# Patient Record
Sex: Female | Born: 1985 | Race: White | Hispanic: No | Marital: Single | Smoking: Never smoker
Health system: Southern US, Community
[De-identification: ages and names within clinical notes are randomized; demographics above are authoritative.]

## PROBLEM LIST (undated history)

## (undated) DIAGNOSIS — N920 Excessive and frequent menstruation with regular cycle: Secondary | ICD-10-CM

## (undated) DIAGNOSIS — R569 Unspecified convulsions: Secondary | ICD-10-CM

## (undated) DIAGNOSIS — Z923 Personal history of irradiation: Secondary | ICD-10-CM

## (undated) DIAGNOSIS — R51 Headache: Secondary | ICD-10-CM

## (undated) DIAGNOSIS — R519 Headache, unspecified: Secondary | ICD-10-CM

## (undated) DIAGNOSIS — C801 Malignant (primary) neoplasm, unspecified: Secondary | ICD-10-CM

## (undated) DIAGNOSIS — C50919 Malignant neoplasm of unspecified site of unspecified female breast: Secondary | ICD-10-CM

## (undated) DIAGNOSIS — Z9221 Personal history of antineoplastic chemotherapy: Secondary | ICD-10-CM

## (undated) DIAGNOSIS — Z973 Presence of spectacles and contact lenses: Secondary | ICD-10-CM

## (undated) HISTORY — PX: ANKLE ARTHROSCOPY: SUR85

## (undated) HISTORY — DX: Headache: R51

## (undated) HISTORY — DX: Unspecified convulsions: R56.9

## (undated) HISTORY — DX: Headache, unspecified: R51.9

---

## 2005-07-09 ENCOUNTER — Ambulatory Visit (HOSPITAL_BASED_OUTPATIENT_CLINIC_OR_DEPARTMENT_OTHER): Admission: RE | Admit: 2005-07-09 | Discharge: 2005-07-09 | Payer: Self-pay | Admitting: Orthopedic Surgery

## 2005-07-09 ENCOUNTER — Ambulatory Visit (HOSPITAL_COMMUNITY): Admission: RE | Admit: 2005-07-09 | Discharge: 2005-07-09 | Payer: Self-pay | Admitting: Orthopedic Surgery

## 2007-07-20 ENCOUNTER — Ambulatory Visit: Payer: Self-pay | Admitting: Internal Medicine

## 2007-07-20 LAB — CONVERTED CEMR LAB
ALT: 13 units/L (ref 0–35)
AST: 16 units/L (ref 0–37)
Albumin: 4 g/dL (ref 3.5–5.2)
Bilirubin Urine: NEGATIVE
GFR calc Af Amer: 159 mL/min
Leukocytes, UA: NEGATIVE
Nitrite: NEGATIVE
Potassium: 4.2 meq/L (ref 3.5–5.1)
Sodium: 148 meq/L — ABNORMAL HIGH (ref 135–145)
Specific Gravity, Urine: 1.025 (ref 1.000–1.03)
Total Bilirubin: 0.5 mg/dL (ref 0.3–1.2)
Total Protein, Urine: NEGATIVE mg/dL

## 2011-04-20 NOTE — Assessment & Plan Note (Signed)
[facility]                           PRIMARY CARE OFFICE NOTE   Amy Pittman, Amy Pittman                   MRN:          [id number]  DATE:07/20/2007                            DOB:          08-29-1986    Amy Pittman is a 25 year old woman who presents for a [employer]  physical exam. She has no complaints and actually reports that she is  feeling well and doing well.   PAST MEDICAL HISTORY:   SURGICAL:  None. Orthopedic: Chipped bone in the ankle in 2006.   MEDICAL:  The patient had chicken pox and the usual childhood diseases  with no other medical abnormalities or history.   MEDICATIONS:  None.   ALLERGIES:  None.   HABITS:  No tobacco and no alcohol.   FAMILY HISTORY:  Positive for an alcohol problem in a distant relative.  The patient had a relative with ovarian cancer. A grandparent with lung  cancer. A second degree kinship with hypertension, cardiac disease.  Grandparents with diabetes. A grandparent with glaucoma.   SOCIAL HISTORY:  The patient has graduated from [employer] with a  Bachelor's Degree. 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 She is not sexually active  and is not involved in a significant relationship at this time. She does  hope to go and join the [employer].   The patient reports that she did have some abdominal discomfort and this  was thought to be possibly GYN in origin. She was seen at an urgent care  clinic in [place] on 06/24/2007 and reports that she had a normal breast  exam. She also had a pelvic and pap smear at the time which she was told  was normal. It was suggested to her that she pursue further evaluation  with a transvaginal ultrasound.   The patient reports that she had a TB skin test in October 2007 at  [facility] and there was no induration and I have asked for  records.   IMMUNIZATIONS:  The patient had a DTP series 08/28/1984,  10/11/1984,  12/20/1984, and 02/06/1986. OPD series 08/28/1984, 10/11/1984, 02/06/1986. MMR  09/19/1985 with a booster on 04/24/1993. The patient has never had  hepatitis B immunization.   PHYSICAL EXAMINATION:  VITAL SIGNS:  Temperature 97.1, blood pressure  101/64, pulse 96, weight 165, height 5 foot 5 inches.  GENERAL:  This is a well developed, well nourished mildly overweight  woman in no acute distress.  HEENT:  Normocephalic atraumatic. EACs and tympanic membranes were  normal. Oropharynx with native dentition in good repair. No buccal or  palatal lesions were noted. Posterior pharynx clear. Conjunctivae and  sclerae were clear. PERRLA. EOMI.  Funduscopic exam was unremarkable.  NECK:  Supple without thyromegaly. No adenopathy was noted in the  cervical supraclavicular regions.  CHEST:  No CVA tenderness.  LUNGS:  Clear with no wheezes, rales, or rhonchi.  BREAST:  Deferred to a recent exam at urgent care.  CARDIOVASCULAR:  2+ pulse radial pulse, no JVD or carotid bruits. She  had a quiet precordium with a regular  rate and rhythm with no murmurs,  rubs, or gallops.  ABDOMEN:  Soft, no guarding or rebound. No organo-splenomegaly was  noted.  PELVIC:  Deferred to recent exam as noted.  RECTAL:  Deferred to recent exam as noted.  EXTREMITIES:  Without cyanosis, clubbing, or edema or deformity.  NEUROLOGIC:  Non-focal.   LABORATORY DATA:  Sodium 148, potassium 4.2, chloride 111, CO2 32,  glucose 110, BUN 8, creatinine 0.6, liver functions were normal.  Urinalysis was negative.   ASSESSMENT AND PLAN:  This is a healthy woman. 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 She is still needing to have hepatitis B  service antigen B, core antibody, and C antibody, G6PD  titer,  which  are pending as is HIV serology. 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   In summary, this is a very pleasant woman who seems to be medically  stable.     Rosalyn Gess Norins, MD  Electronically Signed    MEN/MedQ  DD: 07/22/2007  DT: 07/23/2007  Job #: [id number]   cc:   Donita Brooks

## 2011-04-23 NOTE — Op Note (Signed)
NAMEDRAYA, FELKER          ACCOUNT NO.:  [id number]   MEDICAL RECORD NO.:  [id number]          PATIENT TYPE:  OUT   LOCATION:  [facility]                         FACILITY:  [facility]   PHYSICIAN:  Feliberto Gottron. Turner Daniels, M.D.   DATE OF BIRTH:  05-May-1986   DATE OF PROCEDURE:  07/09/2005  DATE OF DISCHARGE:  07/09/2005                                 OPERATIVE REPORT   PREOPERATIVE DIAGNOSIS:  Right ankle osteochondral loose body.   POSTOPERATIVE DIAGNOSIS:  Same.   PROCEDURE:  Right ankle arthroscopic debridement, removal loose body.   SURGEON:  Feliberto Gottron. Turner Daniels, M.D.   ASSISTANT:  Skip Mayer, PA-C.   ANESTHETIC:  General LMA.   ESTIMATED BLOOD LOSS:  Minimal.   FLUID REPLACEMENT:  600 mL crystalloid.   TOURNIQUET TIME:  None.   INDICATIONS FOR PROCEDURE:  25 year old woman with MRI proven superolateral  osteochondral loose body of the right ankle symptomatic. She has failed  conservative treatment with observation, air cast bracing, anti-inflammatory  medicines and by MRI scan, this is a flap tear that was probably loose and  she desires elective arthroscopic evaluation and treatment of same.   DESCRIPTION OF PROCEDURE:  The patient identified by armband, taken the  operating room at [facility] day surgery center. Appropriate anesthetic monitors  were attached and general LMA anesthesia induced with the patient in the  supine position. Ankle distractor applied to the table and the right lower  extremity prepped and draped in usual sterile fashion from the toes to  tourniquet placed the mid calf on the right side that was not used.  Ankle  distraction was then applied and the joint was filled with half percent  Marcaine and epinephrine solution using the anterolateral approach with an  18 gauge needle. Once the joint was distended, a small incision was made  with a #11 blade along the anterolateral portal and the ankle arthroscope  introduced with a pressure set between 50 and 70 mmHg.  Diagnostic  arthroscopy revealed the medial gutter was clear as were the articular  surface of the distal tibia and talar dome except for superior laterally  where the osteochondral flap tear of the cartilage was noted.  At this point  we swapped portals bringing the scope in from the medial side after making  the anteromedial portal and we then removed the flap tear and osteochondral  loose body piecemeal with a 2.9 barracuda sucker shaver, some arthroscopic  baskets and a probe. Once the flap tears and loose bodies had been removed,  photographic documentation was made of the defect on the talar dome and the  ankle was irrigated out with normal saline solution.  At this point the arthroscopic instruments were removed and a dressing of  Xeroform, 4x4 dressing sponges, Webril and Ace wrap applied. The patient was  awakened and taken to the recovery room without difficulty. We did female  photographic documentation of the lesion before and after debridement.      Feliberto Gottron. Turner Daniels, M.D.  Electronically Signed     FJR/MEDQ  D:  07/26/2005  T:  07/26/2005  Job:  [id number]

## 2012-05-09 ENCOUNTER — Other Ambulatory Visit (INDEPENDENT_AMBULATORY_CARE_PROVIDER_SITE_OTHER): Payer: BC Managed Care – PPO

## 2012-05-09 ENCOUNTER — Encounter: Payer: Self-pay | Admitting: Internal Medicine

## 2012-05-09 ENCOUNTER — Ambulatory Visit (INDEPENDENT_AMBULATORY_CARE_PROVIDER_SITE_OTHER): Payer: BC Managed Care – PPO | Admitting: Internal Medicine

## 2012-05-09 VITALS — BP 104/78 | HR 82 | Temp 98.2°F | Resp 16 | Wt 163.0 lb

## 2012-05-09 DIAGNOSIS — R569 Unspecified convulsions: Secondary | ICD-10-CM

## 2012-05-09 DIAGNOSIS — Z0000 Encounter for general adult medical examination without abnormal findings: Secondary | ICD-10-CM

## 2012-05-09 DIAGNOSIS — R109 Unspecified abdominal pain: Secondary | ICD-10-CM

## 2012-05-09 DIAGNOSIS — N926 Irregular menstruation, unspecified: Secondary | ICD-10-CM

## 2012-05-09 LAB — CBC WITH DIFFERENTIAL/PLATELET
Basophils Relative: 0.1 % (ref 0.0–3.0)
Eosinophils Absolute: 0 10*3/uL (ref 0.0–0.7)
HCT: 37.2 % (ref 36.0–46.0)
Hemoglobin: 12.3 g/dL (ref 12.0–15.0)
Lymphocytes Relative: 19.7 % (ref 12.0–46.0)
MCHC: 33.1 g/dL (ref 30.0–36.0)
MCV: 86.5 fl (ref 78.0–100.0)
Monocytes Absolute: 0.6 10*3/uL (ref 0.1–1.0)
Neutro Abs: 7.2 10*3/uL (ref 1.4–7.7)
RBC: 4.3 Mil/uL (ref 3.87–5.11)

## 2012-05-09 LAB — COMPREHENSIVE METABOLIC PANEL
ALT: 11 U/L (ref 0–35)
AST: 15 U/L (ref 0–37)
BUN: 10 mg/dL (ref 6–23)
Calcium: 9.1 mg/dL (ref 8.4–10.5)
Creatinine, Ser: 0.8 mg/dL (ref 0.4–1.2)
GFR: 96.38 mL/min (ref >60.00)
Glucose, Bld: 80 mg/dL (ref 70–99)
Potassium: 5.3 mEq/L — ABNORMAL HIGH (ref 3.5–5.1)
Sodium: 143 mEq/L (ref 135–145)
Total Bilirubin: 0.3 mg/dL (ref 0.3–1.2)

## 2012-05-09 LAB — LIPID PANEL: HDL: 62 mg/dL (ref >39.00)

## 2012-05-10 ENCOUNTER — Encounter: Payer: Self-pay | Admitting: Internal Medicine

## 2012-05-10 DIAGNOSIS — Z0000 Encounter for general adult medical examination without abnormal findings: Secondary | ICD-10-CM | POA: Insufficient documentation

## 2012-05-10 DIAGNOSIS — R109 Unspecified abdominal pain: Secondary | ICD-10-CM | POA: Insufficient documentation

## 2012-05-10 DIAGNOSIS — N926 Irregular menstruation, unspecified: Secondary | ICD-10-CM | POA: Insufficient documentation

## 2012-05-10 DIAGNOSIS — R569 Unspecified convulsions: Secondary | ICD-10-CM | POA: Insufficient documentation
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
## 2012-05-10 NOTE — Assessment & Plan Note (Signed)
Mild discomfort. No evidence of gastric irritation or bleeding. Symptoms are not c/w IBS  Plan - trial of otc anti-gassing agent or liquid antacid with anti-gassing agent, i.e. Mylanta II

## 2012-05-10 NOTE — Assessment & Plan Note (Signed)
Long standing problem. In association with being sexually active and needing adequate birth control she is advised to establish with a gynecologist for routine care, cycling of her period and birth control using oral contraceptives.

## 2012-05-10 NOTE — Assessment & Plan Note (Addendum)
Amy Pittman is a very nice woman who is medically stable. Limited physical exam is normal. She is advised to see a gynecologist. Routine labs are ordered to establish baseline chemistries and lipid panel. Will also check CBC. She is encouraged to develop a regular exercise program.   In summary - a new patient now established. She is asked to return in 6-8 weeks to allow time to obtain and review previous medical records, especially neurology notes.   Addendum: labs reviewed. Her LDL cholesterol is borderline with goal of 130 or less. Recommend a prudent diet and regular exercise.
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
## 2012-05-14 ENCOUNTER — Encounter: Payer: Self-pay | Admitting: Internal Medicine

## 2012-06-20 ENCOUNTER — Telehealth: Payer: Self-pay | Admitting: Internal Medicine

## 2012-06-20 ENCOUNTER — Encounter: Payer: Self-pay | Admitting: Internal Medicine

## 2012-06-20 ENCOUNTER — Ambulatory Visit (INDEPENDENT_AMBULATORY_CARE_PROVIDER_SITE_OTHER)
Admission: RE | Admit: 2012-06-20 | Discharge: 2012-06-20 | Disposition: A | Discharge status: Home / Self Care | Payer: BC Managed Care – PPO | Source: Ambulatory Visit | Attending: Internal Medicine | Admitting: Internal Medicine

## 2012-06-20 ENCOUNTER — Ambulatory Visit (INDEPENDENT_AMBULATORY_CARE_PROVIDER_SITE_OTHER): Payer: BC Managed Care – PPO | Admitting: Internal Medicine

## 2012-06-20 VITALS — BP 98/60 | HR 80 | Temp 97.5°F | Resp 16 | Wt 165.0 lb

## 2012-06-20 DIAGNOSIS — M533 Sacrococcygeal disorders, not elsewhere classified: Secondary | ICD-10-CM

## 2012-06-20 NOTE — Telephone Encounter (Signed)
Forward 15 pages to Dr. Illene Regulus for review on 06-20-12 ym

## 2012-06-20 NOTE — Progress Notes (Signed)
  Subjective:    Patient ID: Amy Pittman, female    DOB: [birth year], 26 y.o.   MRN: [id number]  HPI Ms. Tuggle returns for follow up. She has had a chance to review the original note: no additions or corrections. In the interval she has started doing "Insanity" workouts.  For some time she has had pain at the lower sacrum when sitting. After sitting for some time she will have pain when she rises. There is no history or trauma, no report of any lesion or sinus track, no rectal pain or change in bowel habit.  PMH, FamHx and SocHx reviewed for any changes and relevance.    Review of Systems System review is negative for any constitutional, cardiac, pulmonary, GI or neuro symptoms or complaints other than as described in the HPI.     Objective:   Physical Exam Filed Vitals:   06/20/12 1317  BP: 98/60  Pulse: 80  Temp: 97.5 F (36.4 C)  Resp: 16   Gen'l- WNWD white woman in no distress MSK - no lesions on exam of sacrum. Tender to palpation over lower sacrum. Normal stand, flex, toe/heel walk, normal gait        Assessment & Plan:  Pain at sacrum - normal exam. Question of occult fracture vs bone lesion  Plan Plain x-rays sacrum/coccyx  If films are normal and pain persists next step is bone scan.   Addendum: x-ray results- SACRUM AND COCCYX - 2+ VIEW  Comparison: None.  Findings: No fracture is seen.  The visualized bony pelvis appears intact.  IMPRESSION:  No fracture is seen.

## 2012-06-26 ENCOUNTER — Telehealth: Payer: Self-pay | Admitting: Internal Medicine

## 2012-06-26 NOTE — Telephone Encounter (Signed)
Received 25 pages from [place]. Sent to Dr. Debby Bud. 06/26/12/sd

## 2012-08-18 ENCOUNTER — Ambulatory Visit (INDEPENDENT_AMBULATORY_CARE_PROVIDER_SITE_OTHER)
Admission: RE | Admit: 2012-08-18 | Discharge: 2012-08-18 | Disposition: A | Discharge status: Home / Self Care | Payer: BC Managed Care – PPO | Source: Ambulatory Visit | Attending: Internal Medicine | Admitting: Internal Medicine

## 2012-08-18 ENCOUNTER — Encounter: Payer: Self-pay | Admitting: Internal Medicine

## 2012-08-18 ENCOUNTER — Ambulatory Visit (INDEPENDENT_AMBULATORY_CARE_PROVIDER_SITE_OTHER): Payer: BC Managed Care – PPO | Admitting: Internal Medicine

## 2012-08-18 VITALS — BP 94/70 | HR 72 | Temp 97.2°F | Resp 16 | Ht 65.0 in | Wt 162.0 lb

## 2012-08-18 DIAGNOSIS — M25572 Pain in left ankle and joints of left foot: Secondary | ICD-10-CM

## 2012-08-18 DIAGNOSIS — M659 Synovitis and tenosynovitis, unspecified: Secondary | ICD-10-CM

## 2012-08-18 DIAGNOSIS — M25579 Pain in unspecified ankle and joints of unspecified foot: Secondary | ICD-10-CM

## 2012-08-18 DIAGNOSIS — M7752 Other enthesopathy of left foot: Secondary | ICD-10-CM

## 2012-08-18 MED ORDER — DICLOFENAC SODIUM 75 MG PO TBEC: 75.0000 mg | DELAYED_RELEASE_TABLET | Freq: Two times a day (BID) | ORAL | Status: AC

## 2012-08-18 NOTE — Progress Notes (Signed)
Left detailed mess informing pt of below.  

## 2012-08-18 NOTE — Progress Notes (Signed)
  Subjective:    Patient ID: Amy Pittman, female    DOB: 10/31/1986, 26 y.o.   MRN: [id number]  Ankle Pain  The incident occurred more than 1 week ago. The incident occurred at home. There was no injury mechanism. The pain is present in the left ankle. The quality of the pain is described as aching and shooting. The pain is moderate. The pain has been constant since onset. Pertinent negatives include no inability to bear weight, loss of motion, numbness or tingling. She reports no foreign bodies present. The symptoms are aggravated by weight bearing and palpation (Descending stairs, prolonged standing). She has tried acetaminophen and rest for the symptoms. The treatment provided no relief.    Past Medical History  Diagnosis Date  . History of chicken pox   . Fainting spell   . Generalized headaches   . 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 sees a nuerologist, does not no name., is on no medication at this time due to no insurance  . Irregular menses      Review of Systems  Constitutional: Negative for fever and fatigue.  Musculoskeletal: Positive for gait problem. Negative for joint swelling.  Neurological: Negative for tingling, weakness and numbness.       Objective:   Physical Exam BP 94/70  Pulse 72  Temp 97.2 F (36.2 C) (Oral)  Resp 16  Ht 5\' 5"  (1.651 m)  Wt 162 lb (73.483 kg)  BMI 26.96 kg/m2 Wt Readings from Last 3 Encounters:  08/18/12 162 lb (73.483 kg)  06/20/12 165 lb (74.844 kg)  05/09/12 163 lb (73.936 kg)   Constitutional: She appears well-developed and well-nourished. No distress.  Musculoskeletal: Left ankle without gross deformity. Painful to palpation over medial malleolus. Full range of motion with pain on passive dorsiflexion -ligamentous function intact, no effusion. Neurovascular intact. Skin: Skin is warm and dry. No rash or bruise noted. No erythema.  Psychiatric: She has a normal mood and affect. Her  behavior is normal. Judgment and thought content normal.        Assessment & Plan:  Left ankle pain over medial malleolus. No injury mechanism. Suspect tendinitis  Check plain x-ray Continue over-the-counter brace for support Rest, ice, compress and elevate Prescription Voltaren twice a day x1 week, then as needed Patient education material provided, reassurance provided  patient to call if worse or unimproved

## 2012-08-18 NOTE — Patient Instructions (Signed)
It was good to see you today. Test(s) ordered today. Your results will be called to you after review (48-72hours after test completion). If any changes need to be made, you will be notified at that time. Use Voltaren twice a day with food for one week, then as needed for ankle pain Your prescription(s) have been submitted to your pharmacy. Please take as directed and contact our office if you believe you are having problem(s) with the medication(s). Place cold ice pak to painful area of ankle 2 times a day for 10-15 minutes x5-7 days Sprains may take several weeks to recover, continue to use your ankle support as needed and avoid high impact activity until pain resolved Call if unimproved in next 4-6 weeks, sooner if pain becomes worse Ankle Pain Ankle pain is a common symptom. The bones, cartilage, tendons, and muscles of the ankle joint perform a lot of work each day. The ankle joint holds your body weight and allows you to move around. Ankle pain can occur on either side or back of 1 or both ankles. Ankle pain may be sharp and burning or dull and aching. There may be tenderness, stiffness, redness, or warmth around the ankle. The pain occurs more often when a person walks or puts pressure on the ankle. CAUSES   There are many reasons ankle pain can develop. It is important to work with your caregiver to identify the cause since many conditions can impact the bones, cartilage, muscles, and tendons. Causes for ankle pain include:  Injury, including a break (fracture), sprain, or strain often due to a fall, sports, or a high-impact activity.   Swelling (inflammation) of a tendon (tendonitis).   Achilles tendon rupture.   Ankle instability after repeated sprains and strains.   Poor foot alignment.   Pressure on a nerve (tarsal tunnel syndrome).   Arthritis in the ankle or the lining of the ankle.   Crystal formation in the ankle (gout or pseudogout).  DIAGNOSIS   A diagnosis is based on  your medical history, your symptoms, results of your physical exam, and results of diagnostic tests. Diagnostic tests may include X-ray exams or a computerized magnetic scan (magnetic resonance imaging, MRI). TREATMENT   Treatment will depend on the cause of your ankle pain and may include:  Keeping pressure off the ankle and limiting activities.   Using crutches or other walking support (a cane or brace).   Using rest, ice, compression, and elevation.   Participating in physical therapy or home exercises.   Wearing shoe inserts or special shoes.   Losing weight.   Taking medications to reduce pain or swelling or receiving an injection.   Undergoing surgery.  HOME CARE INSTRUCTIONS    Only take over-the-counter or prescription medicines for pain, discomfort, or fever as directed by your caregiver.   Put ice on the injured area.   Put ice in a plastic bag.   Place a towel between your skin and the bag.   Leave the ice on for 15 to 20 minutes at a time, 3 to 4 times a day.   Keep your leg raised (elevated) when possible to lessen swelling.   Avoid activities that cause ankle pain.   Follow specific exercises as directed by your caregiver.   Record how often you have ankle pain, the location of the pain, and what it feels like. This information may be helpful to you and your caregiver.   Ask your caregiver about returning to work or sports  and whether you should drive.   Follow up with your caregiver for further examination, therapy, or testing as directed.  SEEK MEDICAL CARE IF:    Pain or swelling continues or worsens beyond 1 week.   You have an oral temperature above 102 F (38.9 C).   You are feeling unwell or have chills.   You are having an increasingly difficult time with walking.   You have loss of sensation or other new symptoms.   You have questions or concerns.  MAKE SURE YOU:    Understand these instructions.   Will watch your condition.   Will  get help right away if you are not doing well or get worse.  Document Released: 05/12/2010 Document Revised: 11/11/2011 Document Reviewed: 05/12/2010 [facility] Patient Information 2012 [place], [place].

## 2015-01-21 ENCOUNTER — Ambulatory Visit (INDEPENDENT_AMBULATORY_CARE_PROVIDER_SITE_OTHER): Payer: BLUE CROSS/BLUE SHIELD | Admitting: Internal Medicine

## 2015-01-21 ENCOUNTER — Encounter: Payer: Self-pay | Admitting: Internal Medicine

## 2015-01-21 ENCOUNTER — Ambulatory Visit: Payer: BC Managed Care – PPO | Admitting: Internal Medicine

## 2015-01-21 ENCOUNTER — Other Ambulatory Visit (INDEPENDENT_AMBULATORY_CARE_PROVIDER_SITE_OTHER): Payer: BLUE CROSS/BLUE SHIELD

## 2015-01-21 VITALS — BP 112/70 | HR 88 | Temp 98.3°F | Resp 16 | Ht 66.0 in | Wt 188.4 lb

## 2015-01-21 DIAGNOSIS — Z0000 Encounter for general adult medical examination without abnormal findings: Secondary | ICD-10-CM

## 2015-01-21 DIAGNOSIS — G40909 Epilepsy, unspecified, not intractable, without status epilepticus: Secondary | ICD-10-CM

## 2015-01-21 DIAGNOSIS — R569 Unspecified convulsions: Secondary | ICD-10-CM

## 2015-01-21 DIAGNOSIS — R7989 Other specified abnormal findings of blood chemistry: Secondary | ICD-10-CM

## 2015-01-21 LAB — COMPREHENSIVE METABOLIC PANEL
ALBUMIN: 3.9 g/dL (ref 3.5–5.2)
ALT: 14 U/L (ref 0–35)
AST: 16 U/L (ref 0–37)
Alkaline Phosphatase: 44 U/L (ref 39–117)
BUN: 12 mg/dL (ref 6–23)
CALCIUM: 9.6 mg/dL (ref 8.4–10.5)
CHLORIDE: 104 meq/L (ref 96–112)
CO2: 26 meq/L (ref 19–32)
CREATININE: 0.81 mg/dL (ref 0.40–1.20)
GFR: 87.88 mL/min (ref >60.00)
GLUCOSE: 103 mg/dL — AB (ref 70–99)
Potassium: 4.6 mEq/L (ref 3.5–5.1)
SODIUM: 137 meq/L (ref 135–145)
Total Bilirubin: 0.3 mg/dL (ref 0.2–1.2)
Total Protein: 7.5 g/dL (ref 6.0–8.3)

## 2015-01-21 LAB — LIPID PANEL
Cholesterol: 260 mg/dL — ABNORMAL HIGH (ref 0–200)
HDL: 68.2 mg/dL (ref >39.00)
NonHDL: 191.8
TRIGLYCERIDES: 256 mg/dL — AB (ref 0.0–149.0)
Total CHOL/HDL Ratio: 4
VLDL: 51.2 mg/dL — AB (ref 0.0–40.0)

## 2015-01-21 LAB — LDL CHOLESTEROL, DIRECT: Direct LDL: 148 mg/dL

## 2015-01-21 NOTE — Patient Instructions (Signed)
We will send you to the neurologist to get their opinion if you should be driving or not.   We will check your blood work today. We will call with the results. Come back in about a year for a physical.   Back Exercises These exercises may help you when beginning to rehabilitate your injury. Your symptoms may resolve with or without further involvement from your physician, physical therapist or athletic trainer. While completing these exercises, remember:   Restoring tissue flexibility helps normal motion to return to the joints. This allows healthier, less painful movement and activity.  An effective stretch should be held for at least 30 seconds.  A stretch should never be painful. You should only feel a gentle lengthening or release in the stretched tissue. STRETCH - Extension, Prone on Elbows   Lie on your stomach on the floor, a bed will be too soft. Place your palms about shoulder width apart and at the height of your head.  Place your elbows under your shoulders. If this is too painful, stack pillows under your chest.  Allow your body to relax so that your hips drop lower and make contact more completely with the floor.  Hold this position for __________ seconds.  Slowly return to lying flat on the floor. Repeat __________ times. Complete this exercise __________ times per day.  RANGE OF MOTION - Extension, Prone Press Ups   Lie on your stomach on the floor, a bed will be too soft. Place your palms about shoulder width apart and at the height of your head.  Keeping your back as relaxed as possible, slowly straighten your elbows while keeping your hips on the floor. You may adjust the placement of your hands to maximize your comfort. As you gain motion, your hands will come more underneath your shoulders.  Hold this position __________ seconds.  Slowly return to lying flat on the floor. Repeat __________ times. Complete this exercise __________ times per day.  RANGE OF MOTION-  Quadruped, Neutral Spine   Assume a hands and knees position on a firm surface. Keep your hands under your shoulders and your knees under your hips. You may place padding under your knees for comfort.  Drop your head and point your tail bone toward the ground below you. This will round out your low back like an angry cat. Hold this position for __________ seconds.  Slowly lift your head and release your tail bone so that your back sags into a large arch, like an old horse.  Hold this position for __________ seconds.  Repeat this until you feel limber in your low back.  Now, find your "sweet spot." This will be the most comfortable position somewhere between the two previous positions. This is your neutral spine. Once you have found this position, tense your stomach muscles to support your low back.  Hold this position for __________ seconds. Repeat __________ times. Complete this exercise __________ times per day.  STRETCH - Flexion, Single Knee to Chest   Lie on a firm bed or floor with both legs extended in front of you.  Keeping one leg in contact with the floor, bring your opposite knee to your chest. Hold your leg in place by either grabbing behind your thigh or at your knee.  Pull until you feel a gentle stretch in your low back. Hold __________ seconds.  Slowly release your grasp and repeat the exercise with the opposite side. Repeat __________ times. Complete this exercise __________ times per day.  STRETCH - Hamstrings, Standing  Stand or sit and extend your right / left leg, placing your foot on a chair or foot stool  Keeping a slight arch in your low back and your hips straight forward.  Lead with your chest and lean forward at the waist until you feel a gentle stretch in the back of your right / left knee or thigh. (When done correctly, this exercise requires leaning only a small distance.)  Hold this position for __________ seconds. Repeat __________ times. Complete  this stretch __________ times per day. STRENGTHENING - Deep Abdominals, Pelvic Tilt   Lie on a firm bed or floor. Keeping your legs in front of you, bend your knees so they are both pointed toward the ceiling and your feet are flat on the floor.  Tense your lower abdominal muscles to press your low back into the floor. This motion will rotate your pelvis so that your tail bone is scooping upwards rather than pointing at your feet or into the floor.  With a gentle tension and even breathing, hold this position for __________ seconds. Repeat __________ times. Complete this exercise __________ times per day.  STRENGTHENING - Abdominals, Crunches   Lie on a firm bed or floor. Keeping your legs in front of you, bend your knees so they are both pointed toward the ceiling and your feet are flat on the floor. Cross your arms over your chest.  Slightly tip your chin down without bending your neck.  Tense your abdominals and slowly lift your trunk high enough to just clear your shoulder blades. Lifting higher can put excessive stress on the low back and does not further strengthen your abdominal muscles.  Control your return to the starting position. Repeat __________ times. Complete this exercise __________ times per day.  STRENGTHENING - Quadruped, Opposite UE/LE Lift   Assume a hands and knees position on a firm surface. Keep your hands under your shoulders and your knees under your hips. You may place padding under your knees for comfort.  Find your neutral spine and gently tense your abdominal muscles so that you can maintain this position. Your shoulders and hips should form a rectangle that is parallel with the floor and is not twisted.  Keeping your trunk steady, lift your right hand no higher than your shoulder and then your left leg no higher than your hip. Make sure you are not holding your breath. Hold this position __________ seconds.  Continuing to keep your abdominal muscles tense and  your back steady, slowly return to your starting position. Repeat with the opposite arm and leg. Repeat __________ times. Complete this exercise __________ times per day. Document Released: 12/10/2005 Document Revised: 02/14/2012 Document Reviewed: 03/06/2009 [facility] Patient Information 2015 [place], [place]. This information is not intended to replace advice given to you by your health care provider. Make sure you discuss any questions you have with your health care provider.  Back Injury Prevention Back injuries can be extremely painful and difficult to heal. After having one back injury, you are much more likely to experience another later on. It is important to learn how to avoid injuring or re-injuring your back. The following tips can help you to prevent a back injury. PHYSICAL FITNESS  Exercise regularly and try to develop good tone in your abdominal muscles. Your abdominal muscles provide a lot of the support needed by your back.  Do aerobic exercises (walking, jogging, biking, swimming) regularly.  Do exercises that increase balance and strength (tai chi,  yoga) regularly. This can decrease your risk of falling and injuring your back.  Stretch before and after exercising.  Maintain a healthy weight. The more you weigh, the more stress is placed on your back. For every pound of weight, 10 times that amount of pressure is placed on the back. DIET  Talk to your caregiver about how much calcium and vitamin D you need per day. These nutrients help to prevent weakening of the bones (osteoporosis). Osteoporosis can cause broken (fractured) bones that lead to back pain.  Include good sources of calcium in your diet, such as dairy products, green, leafy vegetables, and products with calcium added (fortified).  Include good sources of vitamin D in your diet, such as milk and foods that are fortified with vitamin D.  Consider taking a nutritional supplement or a multivitamin if needed.  Stop  smoking if you smoke. POSTURE  Sit and stand up straight. Avoid leaning forward when you sit or hunching over when you stand.  Choose chairs with good low back (lumbar) support.  If you work at a desk, sit close to your work so you do not need to lean over. Keep your chin tucked in. Keep your neck drawn back and elbows bent at a right angle. Your arms should look like the letter "L."  Sit high and close to the steering wheel when you drive. Add a lumbar support to your car seat if needed.  Avoid sitting or standing in one position for too long. Take breaks to get up, stretch, and walk around at least once every hour. Take breaks if you are driving for long periods of time.  Sleep on your side with your knees slightly bent, or sleep on your back with a pillow under your knees. Do not sleep on your stomach. LIFTING, TWISTING, AND REACHING  Avoid heavy lifting, especially repetitive lifting. If you must do heavy lifting:  Stretch before lifting.  Work slowly.  Rest between lifts.  Use carts and dollies to move objects when possible.  Make several small trips instead of carrying 1 heavy load.  Ask for help when you need it.  Ask for help when moving big, awkward objects.  Follow these steps when lifting:  Stand with your feet shoulder-width apart.  Get as close to the object as you can. Do not try to pick up heavy objects that are far from your body.  Use handles or lifting straps if they are available.  Bend at your knees. Squat down, but keep your heels off the floor.  Keep your shoulders pulled back, your chin tucked in, and your back straight.  Lift the object slowly, tightening the muscles in your legs, abdomen, and buttocks. Keep the object as close to the center of your body as possible.  When you put a load down, use these same guidelines in reverse.  Do not:  Lift the object above your waist.  Twist at the waist while lifting or carrying a load. Move your feet  if you need to turn, not your waist.  Bend over without bending at your knees.  Avoid reaching over your head, across a table, or for an object on a high surface. OTHER TIPS  Avoid wet floors and keep sidewalks clear of ice to prevent falls.  Do not sleep on a mattress that is too soft or too hard.  Keep items that are used frequently within easy reach.  Put heavier objects on shelves at waist level and lighter objects on  lower or higher shelves.  Find ways to decrease your stress, such as exercise, massage, or relaxation techniques. Stress can build up in your muscles. Tense muscles are more vulnerable to injury.  Seek treatment for depression or anxiety if needed. These conditions can increase your risk of developing back pain. SEEK MEDICAL CARE IF:  You injure your back.  You have questions about diet, exercise, or other ways to prevent back injuries. MAKE SURE YOU:  Understand these instructions.  Will watch your condition.  Will get help right away if you are not doing well or get worse. Document Released: 12/30/2004 Document Revised: 02/14/2012 Document Reviewed: 01/03/2012 [facility] Patient Information 2015 [place], [place]. This information is not intended to replace advice given to you by your health care provider. Make sure you discuss any questions you have with your health care provider.

## 2015-01-21 NOTE — Assessment & Plan Note (Signed)
Up to date on pap smear, tetanus shot. Declines flu shot. She exercises and is a non-smoker.
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
## 2015-01-21 NOTE — Progress Notes (Signed)
Pre visit review using our clinic review tool, if applicable. No additional management support is needed unless otherwise documented below in the visit note. 
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
## 2015-03-03 ENCOUNTER — Ambulatory Visit (INDEPENDENT_AMBULATORY_CARE_PROVIDER_SITE_OTHER): Payer: BLUE CROSS/BLUE SHIELD | Admitting: Neurology

## 2015-03-03 ENCOUNTER — Encounter: Payer: Self-pay | Admitting: Neurology

## 2015-03-03 VITALS — BP 116/82 | HR 80 | Ht 65.5 in | Wt 185.0 lb

## 2015-03-03 DIAGNOSIS — R404 Transient alteration of awareness: Secondary | ICD-10-CM
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
## 2015-03-06 ENCOUNTER — Ambulatory Visit (INDEPENDENT_AMBULATORY_CARE_PROVIDER_SITE_OTHER): Payer: BLUE CROSS/BLUE SHIELD | Admitting: Neurology

## 2015-03-06 DIAGNOSIS — R404 Transient alteration of awareness: Secondary | ICD-10-CM | POA: Diagnosis not present
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
## 2015-03-07 DIAGNOSIS — R404 Transient alteration of awareness: Secondary | ICD-10-CM | POA: Insufficient documentation

## 2015-03-28 ENCOUNTER — Ambulatory Visit (INDEPENDENT_AMBULATORY_CARE_PROVIDER_SITE_OTHER): Payer: BLUE CROSS/BLUE SHIELD | Admitting: Neurology

## 2015-03-28 ENCOUNTER — Encounter: Payer: Self-pay | Admitting: Neurology

## 2015-03-28 VITALS — BP 106/78 | HR 104 | Resp 16 | Ht 65.5 in | Wt 185.0 lb

## 2015-03-28 DIAGNOSIS — R404 Transient alteration of awareness: Secondary | ICD-10-CM

## 2015-03-28 NOTE — Patient Instructions (Signed)
1. Continue to monitor symptoms, call for any changes 2. Follow-up in 6 months 3. As per [place] driving laws, after any episode of loss of consciousness, stop driving until 6 months event-free
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
## 2015-04-02 NOTE — Procedures (Signed)
ELECTROENCEPHALOGRAM REPORT  Date of Study: 03/06/2015  Patient's Name: Amy Pittman MRN: [id number] Date of Birth: [birth year]  Referring Provider: Dr. Ellouise Newer  Clinical History: This is a 29 year old woman with recurrent episodes of blacking out in 2010, at that time EEG was abnormal and patient was started on Lamictal. She stopped medication in 2012 and has not had any further similar symptoms since 2012.  Medications: none  Technical Summary: A multichannel digital 1-hour EEG recording measured by the international 10-20 system with electrodes applied with paste and impedances below 5000 ohms performed in our laboratory with EKG monitoring in an awake and asleep patient.  Hyperventilation and photic stimulation were performed.  The digital EEG was referentially recorded, reformatted, and digitally filtered in a variety of bipolar and referential montages for optimal display.    Description: The patient is awake and asleep during the recording.  During maximal wakefulness, there is a symmetric, medium voltage 10 Hz posterior dominant rhythm that attenuates with eye opening.  The record is symmetric.  During drowsiness and sleep, there is an increase in theta slowing of the background. There is a single sharp transient seen during sleep over the right frontal region, of unclear clinical significance.  Vertex waves and symmetric sleep spindles were seen.  Hyperventilation and photic stimulation did not elicit any abnormalities.  There were no clear epileptiform discharges or electrographic seizures seen.    EKG lead was unremarkable.  Impression: This 1-hour awake and asleep EEG is within normal limits.  Clinical Correlation: A normal EEG does not exclude a clinical diagnosis of epilepsy.  If further clinical questions remain, prolonged EEG may be helpful.  Clinical correlation is advised.   Ellouise Newer, M.D.

## 2015-05-13 ENCOUNTER — Telehealth: Payer: Self-pay | Admitting: Internal Medicine

## 2015-05-13 NOTE — Telephone Encounter (Signed)
Patient's mother called and wanting me to let you know the pain in her back is getting really bad. I tried to get her to come to your appointment this afternoon but can only come in on Monday mornings. She has an appointment for 6/20.

## 2015-05-26 ENCOUNTER — Ambulatory Visit (INDEPENDENT_AMBULATORY_CARE_PROVIDER_SITE_OTHER): Payer: BLUE CROSS/BLUE SHIELD | Admitting: Internal Medicine

## 2015-05-26 ENCOUNTER — Encounter: Payer: Self-pay | Admitting: Internal Medicine

## 2015-05-26 VITALS — BP 106/60 | HR 95 | Temp 98.4°F | Resp 12 | Ht 65.0 in | Wt 186.8 lb

## 2015-05-26 DIAGNOSIS — M545 Low back pain, unspecified: Secondary | ICD-10-CM

## 2015-05-26 MED ORDER — CYCLOBENZAPRINE HCL 5 MG PO TABS: 5.0000 mg | ORAL_TABLET | Freq: Three times a day (TID) | ORAL | Status: DC | PRN

## 2015-05-26 NOTE — Patient Instructions (Addendum)
We will try a muscle relaxer for the back and you can keep taking alleve or ibuprofen for the inflammation.   You can take the flexeril or cyclobenzaprine up to 3 times a day for the pain. The back should be getting better in the next several weeks. You can also use the heating pad to help with the pain.   Back Injury Prevention The following tips can help you to prevent a back injury. PHYSICAL FITNESS  Exercise often. Try to develop strong stomach (abdominal) muscles.  Do aerobic exercises often. This includes walking, jogging, biking, swimming.  Do exercises that help with balance and strength often. This includes tai chi and yoga.  Stretch before and after you exercise.  Keep a healthy weight. DIET   Ask your doctor how much calcium and vitamin D you need every day.  Include calcium in your diet. Foods high in calcium include dairy products; green, leafy vegetables; and products with calcium added (fortified).  Include vitamin D in your diet. Foods high in vitamin D include milk and products with vitamin D added.  Think about taking a multivitamin or other nutritional products called " supplements."  Stop smoking if you smoke. POSTURE   Sit and stand up straight. Avoid leaning forward or hunching over.  Choose chairs that support your lower back.  If you work at a desk:  Sit close to your work so you do not lean over.  Keep your chin tucked in.  Keep your neck drawn back.  Keep your elbows bent at a right angle. Your arms should look like the letter "L."  Sit high and close to the steering wheel when you drive. Add low back support to your car seat if needed.  Avoid sitting or standing in one position for too long. Get up and move around every hour. Take breaks if you are driving for a long time.  Sleep on your side with your knees slightly bent. You can also sleep on your back with a pillow under your knees. Do not sleep on your stomach. LIFTING, TWISTING, AND  REACHING  Avoid heavy lifting, especially lifting over and over again. If you must do heavy lifting:  Stretch before lifting.  Work slowly.  Rest between lifts.  Use carts and dollies to move objects when possible.  Make several small trips instead of carrying 1 heavy load.  Ask for help when you need it.  Ask for help when moving big, awkward objects.  Follow these steps when lifting:  Stand with your feet shoulder-width apart.  Get as close to the object as you can. Do not pick up heavy objects that are far from your body.  Use handles or lifting straps when possible.  Bend at your knees. Squat down, but keep your heels off the floor.  Keep your shoulders back, your chin tucked in, and your back straight.  Lift the object slowly. Tighten the muscles in your legs, stomach, and butt. Keep the object as close to the center of your body as possible.  Reverse these directions when you put a load down.  Do not:  Lift the object above your waist.  Twist at the waist while lifting or carrying a load. Move your feet if you need to turn, not your waist.  Bend over without bending at your knees.  Avoid reaching over your head, across a table, or for an object on a high surface. OTHER TIPS  Avoid wet floors and keep sidewalks clear of ice.  Do not sleep on a mattress that is too soft or too hard.  Keep items that you use often within easy reach.  Put heavier objects on shelves at waist level. Put lighter objects on lower or higher shelves.  Find ways to lessen your stress. You can try exercise, massage, or relaxation.  Get help for depression or anxiety if needed. GET HELP IF:  You injure your back.  You have questions about diet, exercise, or other ways to prevent back injuries. MAKE SURE YOU:  Understand these instructions.  Will watch your condition.  Will get help right away if you are not doing well or get worse. Document Released: 05/10/2008 Document  Revised: 02/14/2012 Document Reviewed: 01/03/2012 [facility] Patient Information 2015 [place], [place]. This information is not intended to replace advice given to you by your health care provider. Make sure you discuss any questions you have with your health care provider.

## 2015-05-26 NOTE — Progress Notes (Signed)
Pre visit review using our clinic review tool, if applicable. No additional management support is needed unless otherwise documented below in the visit note. 

## 2015-05-28 DIAGNOSIS — M545 Low back pain, unspecified: Secondary | ICD-10-CM | POA: Insufficient documentation

## 2015-05-28 NOTE — Assessment & Plan Note (Signed)
Try flexeril for the back pain. No red flags to indicate need for imaging at this time. If no improvement re-evaluate. Also advised heating pads for pain control.

## 2015-05-28 NOTE — Progress Notes (Signed)
   Subjective:    Patient ID: Amy Pittman, female    DOB: 29-Apr-1986, 29 y.o.   MRN: [id number]  HPI The patient is a 29 YO female coming in with low back pain. She has been struggling with it for months and it is now all the time. She has been trying exercises but without much help. She does use tylenol and ibuprofen for the pain which do not help much. She has been cleared to drive which has helped her out some. No numbness or tingling in her feet. No change in bowels or bladder.   Review of Systems  Constitutional: Negative for fever, activity change, appetite change, fatigue and unexpected weight change.  Respiratory: Negative for cough, chest tightness, shortness of breath and wheezing.   Cardiovascular: Negative for chest pain, palpitations and leg swelling.  Gastrointestinal: Negative for nausea, abdominal pain, diarrhea, constipation and abdominal distention.  Musculoskeletal: Positive for back pain. Negative for myalgias, arthralgias and gait problem.  Neurological: Negative for dizziness, seizures, weakness, numbness and headaches.      Objective:   Physical Exam  Constitutional: She is oriented to person, place, and time. She appears well-developed and well-nourished.  HENT:  Head: Normocephalic and atraumatic.  Eyes: EOM are normal.  Neck: Normal range of motion.  Cardiovascular: Normal rate and regular rhythm.   Pulmonary/Chest: Effort normal and breath sounds normal. No respiratory distress. She has no wheezes. She has no rales.  Abdominal: Soft. Bowel sounds are normal. She exhibits no distension. There is no tenderness. There is no rebound.  Musculoskeletal: She exhibits no edema.  Some tenderness in the paraspinal and low back, no radiation.   Neurological: She is alert and oriented to person, place, and time. Coordination normal.  Skin: Skin is warm and dry.   Filed Vitals:   05/26/15 1531  BP: 106/60  Pulse: 95  Temp: 98.4 F (36.9 C)  TempSrc: Oral    Resp: 12  Height: 5\' 5"  (1.651 m)  Weight: 186 lb 12.8 oz (84.732 kg)  SpO2: 98%      Assessment & Plan:

## 2015-07-23 LAB — HM PAP SMEAR

## 2015-09-29 ENCOUNTER — Ambulatory Visit (INDEPENDENT_AMBULATORY_CARE_PROVIDER_SITE_OTHER): Payer: BLUE CROSS/BLUE SHIELD | Admitting: Neurology

## 2015-09-29 ENCOUNTER — Encounter: Payer: Self-pay | Admitting: Neurology

## 2015-09-29 VITALS — BP 100/70 | HR 98 | Ht 65.5 in | Wt 188.0 lb

## 2015-09-29 DIAGNOSIS — R404 Transient alteration of awareness: Secondary | ICD-10-CM | POA: Diagnosis not present
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
## 2015-09-29 NOTE — Patient Instructions (Signed)
1. Continue to monitor your symptoms, avoid sleep deprivation, alcohol 2. Follow-up in 1 year, call for any changes

## 2016-09-28 ENCOUNTER — Encounter: Payer: Self-pay | Admitting: Neurology

## 2016-09-28 ENCOUNTER — Ambulatory Visit (INDEPENDENT_AMBULATORY_CARE_PROVIDER_SITE_OTHER): Payer: BLUE CROSS/BLUE SHIELD | Admitting: Neurology

## 2016-09-28 VITALS — BP 102/68 | HR 76 | Ht 65.5 in | Wt 180.0 lb

## 2016-09-28 DIAGNOSIS — R404 Transient alteration of awareness: Secondary | ICD-10-CM

## 2016-09-28 NOTE — Patient Instructions (Signed)
Continue to monitor your symptoms. As much as you can, avoid potential triggers, such as sleep deprivation, alcohol. As per [place] driving laws, if you have an episode of loss of awareness/consciousness, stop driving until 6 months event-free. Follow-up in 1 year, call for any changes
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
## 2016-12-10 ENCOUNTER — Ambulatory Visit (INDEPENDENT_AMBULATORY_CARE_PROVIDER_SITE_OTHER): Payer: BLUE CROSS/BLUE SHIELD | Admitting: Internal Medicine

## 2016-12-10 ENCOUNTER — Encounter: Payer: Self-pay | Admitting: Internal Medicine

## 2016-12-10 DIAGNOSIS — M5442 Lumbago with sciatica, left side: Secondary | ICD-10-CM

## 2016-12-10 DIAGNOSIS — M5441 Lumbago with sciatica, right side: Secondary | ICD-10-CM

## 2016-12-10 MED ORDER — PREDNISONE 20 MG PO TABS: 40.0000 mg | ORAL_TABLET | Freq: Every day | ORAL | 0 refills | Status: DC

## 2016-12-10 MED ORDER — CYCLOBENZAPRINE HCL 5 MG PO TABS: 5.0000 mg | ORAL_TABLET | Freq: Three times a day (TID) | ORAL | 1 refills | Status: DC | PRN

## 2016-12-10 NOTE — Progress Notes (Signed)
   Subjective:    Patient ID: Amy Pittman, female    DOB: 09/23/1986, 31 y.o.   MRN: [id number]  HPI The patient is a 31 YO female coming in for acute lower back pain. She had been doing well with this for a long time. Started in the last week in the lower back and across. Some pain in her legs as well. No numbness or weakness in her legs. No change to bowel or bladder habits and no fevers or chills. No weight change. 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 Tried aleve and heat and ice which were partially effective.   Review of Systems  Constitutional: Positive for activity change. Negative for appetite change, chills, fatigue, fever and unexpected weight change.  Respiratory: Negative.   Cardiovascular: Negative.   Gastrointestinal: Negative.   Musculoskeletal: Positive for arthralgias and back pain. Negative for gait problem, joint swelling, myalgias, neck pain and neck stiffness.  Neurological: Negative for dizziness, seizures, weakness, light-headedness, numbness and headaches.      Objective:   Physical Exam  Constitutional: She is oriented to person, place, and time. She appears well-developed and well-nourished.  HENT:  Head: Normocephalic and atraumatic.  Eyes: EOM are normal.  Cardiovascular: Normal rate and regular rhythm.   Pulmonary/Chest: Effort normal. No respiratory distress. She has no wheezes. She has no rales.  Abdominal: Soft. She exhibits no distension. There is no tenderness. There is no rebound.  Musculoskeletal: She exhibits tenderness.  Pain in the lumbar region and paraspinally in band.  Neurological: She is alert and oriented to person, place, and time. Coordination normal.  Skin: Skin is warm and dry.   Vitals:   12/10/16 1525  BP: 122/80  Pulse: 89  Resp: 14  Temp: 98.1 F (36.7 C)  TempSrc: Oral  SpO2: 98%  Weight: 180 lb (81.6 kg)  Height: 5' 5.5" (1.664 m)      Assessment & Plan:

## 2016-12-10 NOTE — Progress Notes (Signed)
Pre visit review using our clinic review tool, if applicable. No additional management support is needed unless otherwise documented below in the visit note. 

## 2016-12-10 NOTE — Assessment & Plan Note (Signed)
No red flags today, rx for prednisone burst and flexeril for pain. Can keep taking aleve for the pain as well.

## 2016-12-10 NOTE — Patient Instructions (Signed)
We have sent in the prednisone to take to calm down the back. Take 2 pills a day for 5 days.   We have also sent in the flexeril that you can use up to 3 times a day for the pain.   It is okay to keep taking the aleve if that is helpful.   Back Injury Prevention Introduction Back injuries can be very painful. They can also be difficult to heal. After having one back injury, you are more likely to injure your back again. It is important to learn how to avoid injuring or re-injuring your back. The following tips can help you to prevent a back injury. What should I know about physical fitness?  Exercise for 30 minutes per day on most days of the week or as told by your doctor. Make sure to:  Do aerobic exercises, such as walking, jogging, biking, or swimming.  Do exercises that increase balance and strength, such as tai chi and yoga.  Do stretching exercises. This helps with flexibility.  Try to develop strong belly (abdominal) muscles. Your belly muscles help to support your back.  Stay at a healthy weight. This helps to decrease your risk of a back injury. What should I know about my diet?  Talk with your doctor about your overall diet. Take supplements and vitamins only as told by your doctor.  Talk with your doctor about how much calcium and vitamin D you need each day. These nutrients help to prevent weakening of the bones (osteoporosis).  Include good sources of calcium in your diet, such as:  Dairy products.  Green leafy vegetables.  Products that have had calcium added to them (fortified).  Include good sources of vitamin D in your diet, such as:  Milk.  Foods that have had vitamin D added to them. What should I know about my posture?  Sit up straight and stand up straight. Avoid leaning forward when you sit or hunching over when you stand.  Choose chairs that have good low-back (lumbar) support.  If you work at a desk, sit close to it so you do not need to lean  over. Keep your chin tucked in. Keep your neck drawn back. Keep your elbows bent so your arms look like the letter "L" (right angle).  Sit high and close to the steering wheel when you drive. Add a low-back support to your car seat, if needed.  Avoid sitting or standing in one position for very long. Take breaks to get up, stretch, and walk around at least one time every hour. Take breaks every hour if you are driving for long periods of time.  Sleep on your side with your knees slightly bent, or sleep on your back with a pillow under your knees. Do not lie on the front of your body to sleep. What should I know about lifting, twisting, and reaching? Lifting and Heavy Lifting   Avoid heavy lifting, especially lifting over and over again. If you must do heavy lifting:  Stretch before lifting.  Work slowly.  Rest between lifts.  Use a tool such as a cart or a dolly to move objects if one is available.  Make several small trips instead of carrying one heavy load.  Ask for help when you need it, especially when moving big objects.  Follow these steps when lifting:  Stand with your feet shoulder-width apart.  Get as close to the object as you can. Do not pick up a heavy object that is  far from your body.  Use handles or lifting straps if they are available.  Bend at your knees. Squat down, but keep your heels off the floor.  Keep your shoulders back. Keep your chin tucked in. Keep your back straight.  Lift the object slowly while you tighten the muscles in your legs, belly, and butt. Keep the object as close to the center of your body as possible.  Follow these steps when putting down a heavy load:  Stand with your feet shoulder-width apart.  Lower the object slowly while you tighten the muscles in your legs, belly, and butt. Keep the object as close to the center of your body as possible.  Keep your shoulders back. Keep your chin tucked in. Keep your back straight.  Bend at  your knees. Squat down, but keep your heels off the floor.  Use handles or lifting straps if they are available. Twisting and Reaching  Avoid lifting heavy objects above your waist.  Do not twist at your waist while you are lifting or carrying a load. If you need to turn, move your feet.  Do not bend over without bending at your knees.  Avoid reaching over your head, across a table, or for an object on a high surface. What are some other tips?  Avoid wet floors and icy ground. Keep sidewalks clear of ice to prevent falls.  Do not sleep on a mattress that is too soft or too hard.  Keep items that you use often within easy reach.  Put heavier objects on shelves at waist level, and put lighter objects on lower or higher shelves.  Find ways to lower your stress, such as:  Exercise.  Massage.  Relaxation techniques.  Talk with your doctor if you feel anxious or depressed. These conditions can make back pain worse.  Wear flat heel shoes with cushioned soles.  Avoid making quick (sudden) movements.  Use both shoulder straps when carrying a backpack.  Do not use any tobacco products, including cigarettes, chewing tobacco, or electronic cigarettes. If you need help quitting, ask your doctor. This information is not intended to replace advice given to you by your health care provider. Make sure you discuss any questions you have with your health care provider. Document Released: 05/10/2008 Document Revised: 04/29/2016 Document Reviewed: 11/26/2014  2017 Elsevier

## 2017-09-28 ENCOUNTER — Ambulatory Visit: Payer: BLUE CROSS/BLUE SHIELD | Admitting: Neurology

## 2017-11-22 ENCOUNTER — Other Ambulatory Visit: Payer: Self-pay | Admitting: Obstetrics and Gynecology

## 2017-11-22 ENCOUNTER — Other Ambulatory Visit: Payer: Self-pay

## 2017-11-22 ENCOUNTER — Encounter (HOSPITAL_BASED_OUTPATIENT_CLINIC_OR_DEPARTMENT_OTHER): Payer: Self-pay | Admitting: *Deleted

## 2017-11-22 NOTE — Progress Notes (Signed)
SPOKE W/ PT VIA PHONE FOR PRE-OP INTERVIEW.  NPO AFTER MN W/ EXCEPTION CLEAR LIQUIDS UNTIL 0630 (NO CREAM [place] PRODUCTS).  ARRIVE AT 1030.  NEEDS URINE PREG.  GETTING CBC DONE Wednesday 11-23-2017 AT 0800.  WILL TAKE AM MED W/ SIPS OF WATER.
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
## 2017-11-23 ENCOUNTER — Encounter (HOSPITAL_COMMUNITY)
Admission: RE | Admit: 2017-11-23 | Discharge: 2017-11-23 | Disposition: A | Discharge status: Home / Self Care | Payer: BLUE CROSS/BLUE SHIELD | Source: Ambulatory Visit | Attending: Obstetrics and Gynecology | Admitting: Obstetrics and Gynecology

## 2017-11-23 DIAGNOSIS — N926 Irregular menstruation, unspecified: Secondary | ICD-10-CM | POA: Diagnosis not present

## 2017-11-23 DIAGNOSIS — N920 Excessive and frequent menstruation with regular cycle: Secondary | ICD-10-CM | POA: Diagnosis not present

## 2017-11-23 DIAGNOSIS — Z793 Long term (current) use of hormonal contraceptives: Secondary | ICD-10-CM | POA: Diagnosis not present

## 2017-11-23 DIAGNOSIS — G4089 Other seizures: Secondary | ICD-10-CM | POA: Diagnosis not present

## 2017-11-23 DIAGNOSIS — Z79899 Other long term (current) drug therapy: Secondary | ICD-10-CM | POA: Diagnosis not present

## 2017-11-23 DIAGNOSIS — Z62810 Personal history of physical and sexual abuse in childhood: Secondary | ICD-10-CM | POA: Diagnosis not present

## 2017-11-23 DIAGNOSIS — Z8619 Personal history of other infectious and parasitic diseases: Secondary | ICD-10-CM | POA: Diagnosis not present

## 2017-11-23 DIAGNOSIS — Z7952 Long term (current) use of systemic steroids: Secondary | ICD-10-CM | POA: Diagnosis not present

## 2017-11-23 DIAGNOSIS — Z9889 Other specified postprocedural states: Secondary | ICD-10-CM | POA: Diagnosis not present

## 2017-11-23 LAB — CBC
HEMATOCRIT: 27.7 % — AB (ref 36.0–46.0)
Hemoglobin: 8.8 g/dL — ABNORMAL LOW (ref 12.0–15.0)
MCH: 25.6 pg — ABNORMAL LOW (ref 26.0–34.0)
MCHC: 31.8 g/dL (ref 30.0–36.0)
MCV: 80.5 fL (ref 78.0–100.0)
Platelets: 494 10*3/uL — ABNORMAL HIGH (ref 150–400)
RBC: 3.44 MIL/uL — ABNORMAL LOW (ref 3.87–5.11)
RDW: 14.2 % (ref 11.5–15.5)
WBC: 6.3 10*3/uL (ref 4.0–10.5)

## 2017-11-24 ENCOUNTER — Encounter (HOSPITAL_BASED_OUTPATIENT_CLINIC_OR_DEPARTMENT_OTHER)
Admission: RE | Disposition: A | Discharge status: Home / Self Care | Payer: Self-pay | Source: Ambulatory Visit | Attending: Obstetrics and Gynecology

## 2017-11-24 ENCOUNTER — Ambulatory Visit (HOSPITAL_BASED_OUTPATIENT_CLINIC_OR_DEPARTMENT_OTHER): Payer: BLUE CROSS/BLUE SHIELD | Admitting: Anesthesiology

## 2017-11-24 ENCOUNTER — Ambulatory Visit (HOSPITAL_COMMUNITY)
Admission: RE | Admit: 2017-11-24 | Discharge: 2017-11-24 | Disposition: A | Discharge status: Home / Self Care | Payer: BLUE CROSS/BLUE SHIELD | Source: Ambulatory Visit | Attending: Obstetrics and Gynecology | Admitting: Obstetrics and Gynecology

## 2017-11-24 ENCOUNTER — Encounter (HOSPITAL_BASED_OUTPATIENT_CLINIC_OR_DEPARTMENT_OTHER): Payer: Self-pay

## 2017-11-24 DIAGNOSIS — N926 Irregular menstruation, unspecified: Secondary | ICD-10-CM | POA: Insufficient documentation

## 2017-11-24 DIAGNOSIS — G4089 Other seizures: Secondary | ICD-10-CM | POA: Insufficient documentation

## 2017-11-24 DIAGNOSIS — Z9889 Other specified postprocedural states: Secondary | ICD-10-CM | POA: Insufficient documentation

## 2017-11-24 DIAGNOSIS — Z62810 Personal history of physical and sexual abuse in childhood: Secondary | ICD-10-CM | POA: Insufficient documentation

## 2017-11-24 DIAGNOSIS — N920 Excessive and frequent menstruation with regular cycle: Secondary | ICD-10-CM | POA: Diagnosis not present

## 2017-11-24 DIAGNOSIS — Z79899 Other long term (current) drug therapy: Secondary | ICD-10-CM | POA: Insufficient documentation

## 2017-11-24 DIAGNOSIS — Z7952 Long term (current) use of systemic steroids: Secondary | ICD-10-CM | POA: Insufficient documentation

## 2017-11-24 DIAGNOSIS — Z793 Long term (current) use of hormonal contraceptives: Secondary | ICD-10-CM | POA: Insufficient documentation

## 2017-11-24 DIAGNOSIS — Z8619 Personal history of other infectious and parasitic diseases: Secondary | ICD-10-CM | POA: Insufficient documentation

## 2017-11-24 HISTORY — PX: DILATATION & CURETTAGE/HYSTEROSCOPY WITH MYOSURE: SHX6511

## 2017-11-24 HISTORY — DX: Excessive and frequent menstruation with regular cycle: N92.0

## 2017-11-24 HISTORY — DX: Presence of spectacles and contact lenses: Z97.3

## 2017-11-24 LAB — ABO/RH: ABO/RH(D): A POS

## 2017-11-24 LAB — TYPE AND SCREEN
ABO/RH(D): A POS
Antibody Screen: NEGATIVE

## 2017-11-24 LAB — POCT PREGNANCY, URINE: PREG TEST UR: NEGATIVE

## 2017-11-24 LAB — POCT HEMOGLOBIN-HEMACUE: HEMOGLOBIN: 9.5 g/dL — AB (ref 12.0–15.0)

## 2017-11-24 SURGERY — DILATATION & CURETTAGE/HYSTEROSCOPY WITH MYOSURE
Anesthesia: General

## 2017-11-24 MED ORDER — KETOROLAC TROMETHAMINE 30 MG/ML IJ SOLN
INTRAMUSCULAR | Status: DC | PRN
  Administered 2017-11-24: 30 mg via INTRAVENOUS

## 2017-11-24 MED ORDER — ACETAMINOPHEN 500 MG PO TABS
ORAL_TABLET | ORAL | Status: AC
  Filled 2017-11-24: qty 2

## 2017-11-24 MED ORDER — PROPOFOL 10 MG/ML IV BOLUS
INTRAVENOUS | Status: DC | PRN
  Administered 2017-11-24: 200 mg via INTRAVENOUS

## 2017-11-24 MED ORDER — DEXAMETHASONE SODIUM PHOSPHATE 10 MG/ML IJ SOLN
INTRAMUSCULAR | Status: AC
  Filled 2017-11-24: qty 1

## 2017-11-24 MED ORDER — ONDANSETRON HCL 4 MG/2ML IJ SOLN
INTRAMUSCULAR | Status: AC
  Filled 2017-11-24: qty 2

## 2017-11-24 MED ORDER — LIDOCAINE 2% (20 MG/ML) 5 ML SYRINGE
INTRAMUSCULAR | Status: DC | PRN
  Administered 2017-11-24: 100 mg via INTRAVENOUS

## 2017-11-24 MED ORDER — PROPOFOL 10 MG/ML IV BOLUS
INTRAVENOUS | Status: AC
  Filled 2017-11-24: qty 20

## 2017-11-24 MED ORDER — LACTATED RINGERS IV SOLN
INTRAVENOUS | Status: DC
  Filled 2017-11-24: qty 1000

## 2017-11-24 MED ORDER — FENTANYL CITRATE (PF) 100 MCG/2ML IJ SOLN
25.0000 ug | INTRAMUSCULAR | Status: DC | PRN
  Filled 2017-11-24: qty 1

## 2017-11-24 MED ORDER — ACETAMINOPHEN 500 MG PO TABS
1000.0000 mg | ORAL_TABLET | Freq: Four times a day (QID) | ORAL | Status: AC | PRN
  Administered 2017-11-24: 1000 mg via ORAL
  Filled 2017-11-24: qty 2

## 2017-11-24 MED ORDER — LACTATED RINGERS IV SOLN
INTRAVENOUS | Status: DC
  Administered 2017-11-24 (×2): via INTRAVENOUS
  Filled 2017-11-24: qty 1000

## 2017-11-24 MED ORDER — DEXAMETHASONE SODIUM PHOSPHATE 10 MG/ML IJ SOLN
INTRAMUSCULAR | Status: DC | PRN
  Administered 2017-11-24: 10 mg via INTRAVENOUS

## 2017-11-24 MED ORDER — SODIUM CHLORIDE 0.9 % IR SOLN
Status: DC | PRN
  Administered 2017-11-24: 3000 mL

## 2017-11-24 MED ORDER — KETOROLAC TROMETHAMINE 30 MG/ML IJ SOLN
INTRAMUSCULAR | Status: AC
  Filled 2017-11-24: qty 1

## 2017-11-24 MED ORDER — FENTANYL CITRATE (PF) 100 MCG/2ML IJ SOLN
INTRAMUSCULAR | Status: AC
  Filled 2017-11-24: qty 2

## 2017-11-24 MED ORDER — LIDOCAINE 2% (20 MG/ML) 5 ML SYRINGE
INTRAMUSCULAR | Status: AC
  Filled 2017-11-24: qty 5

## 2017-11-24 MED ORDER — ONDANSETRON HCL 4 MG/2ML IJ SOLN
INTRAMUSCULAR | Status: DC | PRN
  Administered 2017-11-24: 4 mg via INTRAVENOUS

## 2017-11-24 MED ORDER — MIDAZOLAM HCL 2 MG/2ML IJ SOLN
INTRAMUSCULAR | Status: AC
  Filled 2017-11-24: qty 2

## 2017-11-24 MED ORDER — FENTANYL CITRATE (PF) 100 MCG/2ML IJ SOLN
INTRAMUSCULAR | Status: DC | PRN
  Administered 2017-11-24 (×2): 50 ug via INTRAVENOUS

## 2017-11-24 MED ORDER — MIDAZOLAM HCL 5 MG/5ML IJ SOLN
INTRAMUSCULAR | Status: DC | PRN
  Administered 2017-11-24: 2 mg via INTRAVENOUS

## 2017-11-24 SURGICAL SUPPLY — 16 items
CANISTER SUCT 3000ML PPV (MISCELLANEOUS) ×2 IMPLANT
CATH ROBINSON RED A/P 16FR (CATHETERS) ×2 IMPLANT
CONTAINER PREFILL 10% NBF 60ML (FORM) ×4 IMPLANT
DEVICE MYOSURE LITE (MISCELLANEOUS) IMPLANT
DEVICE MYOSURE REACH (MISCELLANEOUS) IMPLANT
FILTER ARTHROSCOPY CONVERTOR (FILTER) ×2 IMPLANT
GLOVE BIO SURGEON STRL SZ7 (GLOVE) ×2 IMPLANT
GLOVE BIOGEL PI IND STRL 7.0 (GLOVE) ×1 IMPLANT
GLOVE BIOGEL PI INDICATOR 7.0 (GLOVE) ×1
GOWN STRL REUS W/TWL LRG LVL3 (GOWN DISPOSABLE) ×4 IMPLANT
PACK VAGINAL MINOR WOMEN LF (CUSTOM PROCEDURE TRAY) ×2 IMPLANT
PAD OB MATERNITY 4.3X12.25 (PERSONAL CARE ITEMS) ×2 IMPLANT
SEAL ROD LENS SCOPE MYOSURE (ABLATOR) ×2 IMPLANT
TOWEL OR 17X24 6PK STRL BLUE (TOWEL DISPOSABLE) ×4 IMPLANT
TUBING AQUILEX INFLOW (TUBING) ×2 IMPLANT
TUBING AQUILEX OUTFLOW (TUBING) ×2 IMPLANT

## 2017-11-24 NOTE — Anesthesia Postprocedure Evaluation (Signed)
Anesthesia Post Note  Patient: Amy Pittman  Procedure(s) Performed: DILATATION & CURETTAGE/HYSTEROSCOPY (N/A )     Patient location during evaluation: PACU Anesthesia Type: General Level of consciousness: awake Pain management: pain level controlled Vital Signs Assessment: post-procedure vital signs reviewed and stable Respiratory status: spontaneous breathing Cardiovascular status: stable Anesthetic complications: no    Last Vitals:  Vitals:   11/24/17 1031 11/24/17 1248  BP: 115/76 118/87  Pulse: 96 84  Resp: 18 18  Temp: 37.1 C 36.6 C  SpO2: 99% 100%    Last Pain:  Vitals:   11/24/17 1248  TempSrc:   PainSc: Asleep                 Samyra Limb

## 2017-11-24 NOTE — Transfer of Care (Signed)
Immediate Anesthesia Transfer of Care Note  Patient: Amy Pittman  Procedure(s) Performed: DILATATION & CURETTAGE/HYSTEROSCOPY WITH MYOSURE (N/A )  Patient Location: PACU  Anesthesia Type:General  Level of Consciousness: sedated and responds to stimulation  Airway & Oxygen Therapy: Patient Spontanous Breathing and Patient connected to nasal cannula oxygen  Post-op Assessment: Report given to RN  Post vital signs: Reviewed and stable  Last Vitals: 118/87, 86, 20, 100% Vitals:   11/24/17 1031  BP: 115/76  Pulse: 96  Resp: 18  Temp: 37.1 C  SpO2: 99%    Last Pain:  Vitals:   11/24/17 1031  TempSrc: Oral      Patients Stated Pain Goal: 5 (79/15/05 6979)  Complications: No apparent anesthesia complications

## 2017-11-24 NOTE — Anesthesia Procedure Notes (Signed)
Procedure Name: LMA Insertion Date/Time: 11/24/2017 12:10 PM Performed by: Bonney Aid, CRNA Pre-anesthesia Checklist: Patient identified, Emergency Drugs available, Suction available and Patient being monitored Patient Re-evaluated:Patient Re-evaluated prior to induction Oxygen Delivery Method: Circle system utilized Preoxygenation: Pre-oxygenation with 100% oxygen Induction Type: IV induction Ventilation: Mask ventilation without difficulty LMA: LMA inserted LMA Size: 4.0 Number of attempts: 1 Airway Equipment and Method: Bite block Placement Confirmation: positive ETCO2 Tube secured with: Tape Dental Injury: Teeth and Oropharynx as per pre-operative assessment

## 2017-11-24 NOTE — Anesthesia Preprocedure Evaluation (Addendum)
Anesthesia Evaluation  Patient identified by MRN, date of birth, ID band Patient awake    Reviewed: Allergy & Precautions, NPO status , Patient's Chart, lab work & pertinent test results  Airway Mallampati: II  TM Distance: >3 FB     Dental   Pulmonary neg pulmonary ROS,    breath sounds clear to auscultation       Cardiovascular negative cardio ROS   Rhythm:Regular Rate:Normal     Neuro/Psych  Headaches, Seizures -,     GI/Hepatic negative GI ROS,   Endo/Other  negative endocrine ROS  Renal/GU negative Renal ROS     Musculoskeletal   Abdominal   Peds  Hematology   Anesthesia Other Findings   Reproductive/Obstetrics                            Anesthesia Physical Anesthesia Plan  ASA: III  Anesthesia Plan: General   Post-op Pain Management:    Induction: Intravenous  PONV Risk Score and Plan: 3 and Treatment may vary due to age or medical condition, Ondansetron, Dexamethasone and Midazolam  Airway Management Planned: LMA  Additional Equipment:   Intra-op Plan:   Post-operative Plan: Extubation in OR  Informed Consent: I have reviewed the patients History and Physical, chart, labs and discussed the procedure including the risks, benefits and alternatives for the proposed anesthesia with the patient or authorized representative who has indicated his/her understanding and acceptance.   Dental advisory given  Plan Discussed with: CRNA and Anesthesiologist  Anesthesia Plan Comments:        Anesthesia Quick Evaluation

## 2017-11-24 NOTE — Brief Op Note (Signed)
11/24/2017  12:43 PM  PATIENT:  Amy Pittman  31 y.o. female  PRE-OPERATIVE DIAGNOSIS:  MENORRHAGIA  POST-OPERATIVE DIAGNOSIS:  * No post-op diagnosis entered *  PROCEDURE:  Hysteroscopy, D&C SURGEON:  Surgeon(s) and Role:    Bobbye Charleston, MD - Primary   ANESTHESIA:   general  EBL:  50 mL    SPECIMEN:  Source of Specimen:  uterine currettings  DISPOSITION OF SPECIMEN:  PATHOLOGY  COUNTS:  YES  TOURNIQUET:  * No tourniquets in log *  DICTATION: .Note written in EPIC  PLAN OF CARE: Discharge to home after PACU  PATIENT DISPOSITION:  PACU - hemodynamically stable.   Delay start of Pharmacological VTE agent (>24hrs) due to surgical blood loss or risk of bleeding: not applicable Findings: old clot or tissue, no specific polyp, normal cavity   After adequate anesthesia was achieved, the patient was prepped and draped in the usual sterile fashion.  The speculum was placed in the vagina and the cervix stabilized with a single-tooth tenaculum.  The cervix was dilated with pratt dilators and the hysteroscope passed inside the endometrial cavity.  The above findings were noted and polyp forceps were used to remove small pieces of tissue.  Sharp curettage was then gently performed and uterine curettings sent to path.  All instruments were removed from the vagina.  The patient tolerated the procedure well.    Tadeo Besecker A

## 2017-11-24 NOTE — Op Note (Signed)
11/24/2017  12:43 PM  PATIENT:  Amy Pittman  31 y.o. female  PRE-OPERATIVE DIAGNOSIS:  MENORRHAGIA  POST-OPERATIVE DIAGNOSIS:  * No post-op diagnosis entered *  PROCEDURE:  Hysteroscopy, D&C SURGEON:  Surgeon(s) and Role:    Bobbye Charleston, MD - Primary   ANESTHESIA:   general  EBL:  50 mL    SPECIMEN:  Source of Specimen:  uterine currettings  DISPOSITION OF SPECIMEN:  PATHOLOGY  COUNTS:  YES  TOURNIQUET:  * No tourniquets in log *  DICTATION: .Note written in EPIC  PLAN OF CARE: Discharge to home after PACU  PATIENT DISPOSITION:  PACU - hemodynamically stable.   Delay start of Pharmacological VTE agent (>24hrs) due to surgical blood loss or risk of bleeding: not applicable Findings: old clot or tissue, no specific polyp, normal cavity   After adequate anesthesia was achieved, the patient was prepped and draped in the usual sterile fashion.  The speculum was placed in the vagina and the cervix stabilized with a single-tooth tenaculum.  The cervix was dilated with pratt dilators and the hysteroscope passed inside the endometrial cavity.  The above findings were noted and polyp forceps were used to remove small pieces of tissue.  Sharp curettage was then gently performed and uterine curettings sent to path.  All instruments were removed from the vagina.  The patient tolerated the procedure well.    [name]

## 2017-11-24 NOTE — Progress Notes (Signed)
There has been no change in the patients history, status or exam since the history and physical.  Vitals:   11/22/17 1454 11/24/17 1031 11/24/17 1111  BP:  115/76   Pulse:  96   Resp:  18   Temp:  98.7 F (37.1 C)   TempSrc:  Oral   SpO2:  99%   Weight: 180 lb (81.6 kg) 189 lb 9.6 oz (86 kg)   Height: 5\' 5"  (1.651 m)  5\' 5"  (1.651 m)    Lab Results  Component Value Date   WBC 6.3 11/23/2017   HGB 9.5 (L) 11/24/2017   HCT 27.7 (L) 11/23/2017   MCV 80.5 11/23/2017   PLT 494 (H) 11/23/2017   Results for orders placed or performed during the hospital encounter of 11/24/17 (from the past 24 hour(s))  Pregnancy, urine POC     Status: None   Collection Time: 11/24/17 11:31 AM  Result Value Ref Range   Preg Test, Ur NEGATIVE NEGATIVE  Hemoglobin-hemacue, POC     Status: Abnormal   Collection Time: 11/24/17 11:32 AM  Result Value Ref Range   Hemoglobin 9.5 (L) 12.0 - 15.0 g/dL  T&S done. Salaam Battershell A

## 2017-11-24 NOTE — Discharge Instructions (Signed)
May have Tylenol after 5:30 PM. May advil or motrin after 6:30 PM. Post Anesthesia Home Care Instructions  Activity: Get plenty of rest for the remainder of the day. A responsible individual must stay with you for 24 hours following the procedure.  For the next 24 hours, DO NOT: -Drive a car -Paediatric nurse -Drink alcoholic beverages -Take any medication unless instructed by your physician -Make any legal decisions or sign important papers.  Meals: Start with liquid foods such as gelatin or soup. Progress to regular foods as tolerated. Avoid greasy, spicy, heavy foods. If nausea and/or vomiting occur, drink only clear liquids until the nausea and/or vomiting subsides. Call your physician if vomiting continues.  Special Instructions/Symptoms: Your throat may feel dry or sore from the anesthesia or the breathing tube placed in your throat during surgery. If this causes discomfort, gargle with warm salt water. The discomfort should disappear within 24 hours.    D & C Home care Instructions:   Personal hygiene:  Used sanitary napkins for vaginal drainage not tampons. Shower or tub bathe the day after your procedure. No douching until bleeding stops. Always wipe from front to back after  Elimination.  Activity: Do not drive or operate any equipment today. The effects of the anesthesia are still present and drowsiness may result. Rest today, not necessarily flat bed rest, just take it easy. You may resume your normal activity in one to 2 days.  Sexual activity: No intercourse for one week or as indicated by your physician  Diet: Eat a light diet as desired this evening. You may resume a regular diet tomorrow.  Return to work: One to 2 days.  General Expectations of your surgery: Vaginal bleeding should be no heavier than a normal period. Spotting may continue up to 10 days. Mild cramps may continue for a couple of days. You may have a regular period in 2-6 weeks.  Unexpected  observations call your doctor if these occur: persistent or heavy bleeding. Severe abdominal cramping or pain. Elevation of temperature greater than 100F.  Call for an appointment in one week.    Patient's Signature_______________________________________________________  Nurse's Signature________________________________________________________

## 2017-11-25 ENCOUNTER — Encounter (HOSPITAL_BASED_OUTPATIENT_CLINIC_OR_DEPARTMENT_OTHER): Payer: Self-pay | Admitting: Obstetrics and Gynecology

## 2018-10-11 ENCOUNTER — Encounter: Payer: Self-pay | Admitting: Internal Medicine

## 2018-10-11 ENCOUNTER — Ambulatory Visit: Payer: BLUE CROSS/BLUE SHIELD | Admitting: Internal Medicine

## 2018-10-11 DIAGNOSIS — H00034 Abscess of left upper eyelid: Secondary | ICD-10-CM | POA: Insufficient documentation

## 2018-10-11 MED ORDER — AZITHROMYCIN 250 MG PO TABS: ORAL_TABLET | ORAL | 0 refills | Status: DC

## 2018-10-11 NOTE — Patient Instructions (Signed)
We have sent in azithromycin to take 2 pills today, then starting tomorrow take 1 pill daily until gone.

## 2018-10-11 NOTE — Assessment & Plan Note (Signed)
Stye on the left lower eyelid which appears to be resolving although she appears to have a cellulitis on the left upper eyelid. Rx for azithromycin and advised to stop contacts until resolved and use saline eye drops for irritation and warm wash cloths for drainage.

## 2018-10-11 NOTE — Progress Notes (Signed)
   Subjective:    Patient ID: Amy Pittman, female    DOB: May 03, 1986, 32 y.o.   MRN: [id number]  HPI The patient is a 32 YO female coming in for concerns about eyelid color change, swelling, and stye. This started with a stye on the left lower eyelid about 2 weeks ago. This seemed to be getting better overall but she got swelling on the upper eyelid. She got some burning as well in the eye. She has contacts and stopped wearing those yesterday. She denies itching on the eyelid. She does have swelling on the left upper eyelid. She denies fevers or chills. She is having some crusting as well on the left eye in the morning and throughout the day some. She has not used anything for it. Denies sinus symptoms such as nose dripping, drainage, ear problems.   Review of Systems  Constitutional: Negative.   HENT: Negative.   Eyes: Positive for pain and discharge. Negative for photophobia, redness, itching and visual disturbance.  Respiratory: Negative for cough, chest tightness and shortness of breath.   Cardiovascular: Negative for chest pain, palpitations and leg swelling.  Skin: Negative.   Neurological: Negative.       Objective:   Physical Exam  Constitutional: She is oriented to person, place, and time. She appears well-developed and well-nourished.  HENT:  Head: Normocephalic and atraumatic.  Eyes: Pupils are equal, round, and reactive to light. EOM are normal.  Conjunctivae with some broken vessels but no conjunctivitis. There is a stye healing on the left lower eyelid and red and swollen left upper eyelid with no purulence or fluctuance.   Neck: Normal range of motion.  Cardiovascular: Normal rate and regular rhythm.  Pulmonary/Chest: Effort normal and breath sounds normal. No respiratory distress. She has no wheezes. She has no rales.  Musculoskeletal: She exhibits no edema.  Neurological: She is alert and oriented to person, place, and time. Coordination normal.  Skin: Skin  is warm and dry.   Vitals:   10/11/18 1032  BP: 110/60  Pulse: 74  Temp: 98 F (36.7 C)  TempSrc: Oral  SpO2: 94%  Weight: 200 lb (90.7 kg)  Height: 5\' 5"  (1.651 m)      Assessment & Plan:

## 2018-11-06 NOTE — Progress Notes (Signed)
Abstracted and sent to scan  

## 2020-11-29 ENCOUNTER — Emergency Department (HOSPITAL_COMMUNITY): Payer: 59

## 2020-11-29 ENCOUNTER — Inpatient Hospital Stay (HOSPITAL_COMMUNITY)
Admission: EM | Admit: 2020-11-29 | Discharge: 2020-12-02 | DRG: 418 | Disposition: A | Discharge status: Home / Self Care | Payer: 59 | Attending: General Surgery | Admitting: General Surgery

## 2020-11-29 ENCOUNTER — Inpatient Hospital Stay (HOSPITAL_COMMUNITY): Payer: 59

## 2020-11-29 ENCOUNTER — Encounter (HOSPITAL_COMMUNITY): Payer: Self-pay

## 2020-11-29 ENCOUNTER — Other Ambulatory Visit: Payer: Self-pay

## 2020-11-29 DIAGNOSIS — R100 Acute abdomen: Secondary | ICD-10-CM

## 2020-11-29 DIAGNOSIS — K8064 Calculus of gallbladder and bile duct with chronic cholecystitis without obstruction: Secondary | ICD-10-CM | POA: Diagnosis not present

## 2020-11-29 DIAGNOSIS — Z20822 Contact with and (suspected) exposure to covid-19: Secondary | ICD-10-CM | POA: Diagnosis not present

## 2020-11-29 DIAGNOSIS — K8010 Calculus of gallbladder with chronic cholecystitis without obstruction: Secondary | ICD-10-CM | POA: Diagnosis not present

## 2020-11-29 DIAGNOSIS — K851 Biliary acute pancreatitis without necrosis or infection: Principal | ICD-10-CM | POA: Diagnosis present

## 2020-11-29 DIAGNOSIS — K269 Duodenal ulcer, unspecified as acute or chronic, without hemorrhage or perforation: Secondary | ICD-10-CM | POA: Diagnosis present

## 2020-11-29 DIAGNOSIS — K8020 Calculus of gallbladder without cholecystitis without obstruction: Secondary | ICD-10-CM

## 2020-11-29 DIAGNOSIS — Z975 Presence of (intrauterine) contraceptive device: Secondary | ICD-10-CM

## 2020-11-29 DIAGNOSIS — R569 Unspecified convulsions: Secondary | ICD-10-CM | POA: Diagnosis not present

## 2020-11-29 DIAGNOSIS — K859 Acute pancreatitis without necrosis or infection, unspecified: Secondary | ICD-10-CM | POA: Diagnosis not present

## 2020-11-29 DIAGNOSIS — K8050 Calculus of bile duct without cholangitis or cholecystitis without obstruction: Secondary | ICD-10-CM

## 2020-11-29 DIAGNOSIS — R1011 Right upper quadrant pain: Secondary | ICD-10-CM

## 2020-11-29 DIAGNOSIS — K2950 Unspecified chronic gastritis without bleeding: Secondary | ICD-10-CM | POA: Diagnosis not present

## 2020-11-29 DIAGNOSIS — K2970 Gastritis, unspecified, without bleeding: Secondary | ICD-10-CM | POA: Diagnosis present

## 2020-11-29 LAB — CBC WITH DIFFERENTIAL/PLATELET
Abs Immature Granulocytes: 0.07 10*3/uL (ref 0.00–0.07)
Basophils Absolute: 0.1 10*3/uL (ref 0.0–0.1)
Basophils Relative: 0 %
Eosinophils Absolute: 0 10*3/uL (ref 0.0–0.5)
Eosinophils Relative: 0 %
HCT: 43.1 % (ref 36.0–46.0)
Hemoglobin: 14.7 g/dL (ref 12.0–15.0)
Immature Granulocytes: 1 %
Lymphocytes Relative: 8 %
Lymphs Abs: 1.2 10*3/uL (ref 0.7–4.0)
MCH: 30.6 pg (ref 26.0–34.0)
MCHC: 34.1 g/dL (ref 30.0–36.0)
MCV: 89.6 fL (ref 80.0–100.0)
Monocytes Absolute: 1.1 10*3/uL — ABNORMAL HIGH (ref 0.1–1.0)
Monocytes Relative: 7 %
Neutro Abs: 13 10*3/uL — ABNORMAL HIGH (ref 1.7–7.7)
Neutrophils Relative %: 84 %
Platelets: 393 10*3/uL (ref 150–400)
RBC: 4.81 MIL/uL (ref 3.87–5.11)
RDW: 13 % (ref 11.5–15.5)
WBC: 15.4 10*3/uL — ABNORMAL HIGH (ref 4.0–10.5)
nRBC: 0 % (ref 0.0–0.2)

## 2020-11-29 LAB — COMPREHENSIVE METABOLIC PANEL
ALT: 574 U/L — ABNORMAL HIGH (ref 0–44)
AST: 286 U/L — ABNORMAL HIGH (ref 15–41)
Albumin: 4.1 g/dL (ref 3.5–5.0)
Alkaline Phosphatase: 156 U/L — ABNORMAL HIGH (ref 38–126)
Anion gap: 11 (ref 5–15)
BUN: 6 mg/dL (ref 6–20)
CO2: 22 mmol/L (ref 22–32)
Calcium: 9.6 mg/dL (ref 8.9–10.3)
Chloride: 107 mmol/L (ref 98–111)
Creatinine, Ser: 0.8 mg/dL (ref 0.44–1.00)
GFR, Estimated: >60 mL/min (ref >60)
Glucose, Bld: 122 mg/dL — ABNORMAL HIGH (ref 70–99)
Potassium: 3.6 mmol/L (ref 3.5–5.1)
Sodium: 140 mmol/L (ref 135–145)
Total Bilirubin: 2.5 mg/dL — ABNORMAL HIGH (ref 0.3–1.2)
Total Protein: 7.3 g/dL (ref 6.5–8.1)

## 2020-11-29 LAB — RESP PANEL BY RT-PCR (FLU A&B, COVID) ARPGX2
Influenza A by PCR: NEGATIVE
Influenza B by PCR: NEGATIVE
SARS Coronavirus 2 by RT PCR: NEGATIVE

## 2020-11-29 LAB — LIPASE, BLOOD: Lipase: >10000 U/L — ABNORMAL HIGH (ref 11–51)

## 2020-11-29 MED ORDER — HYDROMORPHONE HCL 1 MG/ML IJ SOLN
0.5000 mg | INTRAMUSCULAR | Status: DC | PRN
  Administered 2020-11-29: 0.5 mg via INTRAVENOUS
  Filled 2020-11-29: qty 1

## 2020-11-29 MED ORDER — SODIUM CHLORIDE 0.9 % IV SOLN: INTRAVENOUS | Status: DC

## 2020-11-29 MED ORDER — ONDANSETRON HCL 4 MG/2ML IJ SOLN
4.0000 mg | Freq: Once | INTRAMUSCULAR | Status: AC
  Administered 2020-11-29: 4 mg via INTRAVENOUS
  Filled 2020-11-29: qty 2

## 2020-11-29 MED ORDER — GADOBUTROL 1 MMOL/ML IV SOLN
10.0000 mL | Freq: Once | INTRAVENOUS | Status: AC | PRN
  Administered 2020-11-29: 10 mL via INTRAVENOUS

## 2020-11-29 MED ORDER — ENOXAPARIN SODIUM 40 MG/0.4ML ~~LOC~~ SOLN
40.0000 mg | Freq: Every day | SUBCUTANEOUS | Status: DC
  Administered 2020-11-29: 40 mg via SUBCUTANEOUS
  Filled 2020-11-29: qty 0.4

## 2020-11-29 MED ORDER — PIPERACILLIN-TAZOBACTAM 3.375 G IVPB 30 MIN
3.3750 g | Freq: Once | INTRAVENOUS | Status: AC
  Administered 2020-11-29: 3.375 g via INTRAVENOUS
  Filled 2020-11-29: qty 50

## 2020-11-29 MED ORDER — SODIUM CHLORIDE 0.9% FLUSH
3.0000 mL | Freq: Two times a day (BID) | INTRAVENOUS | Status: DC
  Administered 2020-11-29 – 2020-12-02 (×4): 3 mL via INTRAVENOUS

## 2020-11-29 MED ORDER — SODIUM CHLORIDE 0.9 % IV BOLUS
1000.0000 mL | Freq: Once | INTRAVENOUS | Status: AC
  Administered 2020-11-29: 1000 mL via INTRAVENOUS

## 2020-11-29 MED ORDER — MORPHINE SULFATE (PF) 4 MG/ML IV SOLN
4.0000 mg | Freq: Once | INTRAVENOUS | Status: AC
  Administered 2020-11-29: 4 mg via INTRAVENOUS
  Filled 2020-11-29: qty 1

## 2020-11-29 NOTE — Progress Notes (Signed)
Amy Pittman is a 34 y.o. female patient admitted. Awake, alert - oriented  X 4 - no acute distress noted.  VSS - Blood pressure 113/79, pulse 68, temperature 97.9 F (36.6 C), temperature source Oral, resp. rate 18, SpO2 99 %.    IV in place, occlusive dsg intact without redness.   Will cont to eval and treat per MD orders.  Vidal Schwalbe, RN 11/29/2020 11:59 PM

## 2020-11-29 NOTE — H&P (Addendum)
History and Physical   Amy Pittman LEX:[id number] DOB: 04/11/1986 DOA: 11/29/2020  PCP: Hoyt Koch, MD   Patient coming from: Home  Chief Complaint: Abdominal pain  HPI: Amy Pittman is a 34 y.o. female with medical history significant of seizures who presents with worsening abdominal pain.  Patient states that she has had intermittent right upper quadrant pain for the last 1-1/2 months.  This pain consists of heavy right upper quadrant pain which can be as bad as a 10 out of 10 at times and radiating to her epigastrium/chest and lasts 2 to 3 hours at a time.  She reports she has been having this on average every 3 days or so.   Today her pain started around 9 AM and was quite severe.  She has had some associated nausea.  Pain was again 10 out of 10.  She had nausea with vomiting for the first time and pain on palpation of her right upper quadrant which caused her to seek medical attention out of concern for her gallbladder.  She denies fever, shortness of breath, cough, constipation, diarrhea.  She denies significant alcohol use or NSAID use.  ED Course: Vitals in ED stable.  Lab work-up showed BMP with glucose 122.  LFTs with AST 286, ALT 574, ALP 156, T bili 2.5.  CBC with leukocytosis to 15.4.  Lipase greater than 10,000.  Respiratory panel for flu and Covid negative.  Ultrasound showed cholelithiasis without cholecystitis as well as fatty liver.  Patient given a dose of Zosyn and pain control in the ED.  General surgery was consulted.  GI also consulted but have not yet responded, per EDP.  Review of Systems: As per HPI otherwise all other systems reviewed and are negative.  Past Medical History:  Diagnosis Date  . Generalized headaches   . Generalized seizures [facility]) neurologist--  per pt followed by [place] neurologist   per pt dx 2010 and per pt none since ,did have work-up negative , felt she was going to sleep Abscent seizure  and triggers are  stress and exhaustion  . Menorrhagia   . Wears glasses     Past Surgical History:  Procedure Laterality Date  . ANKLE ARTHROSCOPY Right 07/09/2005     dr Mayer Camel  [facility]   w/ Debridement and removal loose body  . DILATATION & CURETTAGE/HYSTEROSCOPY WITH MYOSURE N/A 11/24/2017   Procedure: DILATATION & CURETTAGE/HYSTEROSCOPY;  Surgeon: Bobbye Charleston, MD;  Location: [place];  Service: Gynecology;  Laterality: N/A;    Social History  reports that she has never smoked. She has never used smokeless tobacco. She reports that she does not drink alcohol and does not use drugs.  No Known Allergies  Family History  Problem Relation Age of Onset  . Mental illness Father   . Arthritis Maternal Grandfather   . Cancer Maternal Grandfather        lung  . Heart disease Maternal Grandfather   . Stroke Maternal Grandfather   . Hypertension Maternal Grandfather   . Diabetes Maternal Grandfather   Reviewed on admission  Prior to Admission medications   Medication Sig Start Date End Date Taking? Authorizing Provider  azithromycin (ZITHROMAX) 250 MG tablet Day 1 take 2 pills, days 2-5 take 1 pill daily. 10/11/18   Hoyt Koch, MD    Physical Exam: Vitals:   11/29/20 1601 11/29/20 1955  BP: 136/81 118/72  Pulse: 78 74  Resp: 18 16  Temp: 98.3 F (36.8 C)  SpO2: 98% 98%   Physical Exam Constitutional:      General: She is not in acute distress.    Appearance: Normal appearance.  HENT:     Head: Normocephalic and atraumatic.     Mouth/Throat:     Mouth: Mucous membranes are moist.     Pharynx: Oropharynx is clear.  Eyes:     Extraocular Movements: Extraocular movements intact.     Pupils: Pupils are equal, round, and reactive to light.  Cardiovascular:     Rate and Rhythm: Normal rate and regular rhythm.     Pulses: Normal pulses.     Heart sounds: Normal heart sounds.  Pulmonary:     Effort: Pulmonary effort is normal. No respiratory distress.      Breath sounds: Normal breath sounds.  Abdominal:     General: Bowel sounds are normal. There is no distension.     Palpations: Abdomen is soft.     Tenderness: There is abdominal tenderness.     Comments: Decreased bowl sounds  Musculoskeletal:        General: No swelling or deformity.  Skin:    General: Skin is warm and dry.  Neurological:     General: No focal deficit present.     Mental Status: Mental status is at baseline.    Labs on Admission: I have personally reviewed following labs and imaging studies  CBC: Recent Labs  Lab 11/29/20 1828  WBC 15.4*  NEUTROABS 13.0*  HGB 14.7  HCT 43.1  MCV 89.6  PLT 102    Basic Metabolic Panel: Recent Labs  Lab 11/29/20 1828  NA 140  K 3.6  CL 107  CO2 22  GLUCOSE 122*  BUN 6  CREATININE 0.80  CALCIUM 9.6    GFR: CrCl cannot be calculated (Unknown ideal weight.).  Liver Function Tests: Recent Labs  Lab 11/29/20 1828  AST 286*  ALT 574*  ALKPHOS 156*  BILITOT 2.5*  PROT 7.3  ALBUMIN 4.1    Urine analysis:    Component Value Date/Time   COLORURINE YELLOW 07/20/2007 1451   APPEARANCEUR Clear 07/20/2007 1451   LABSPEC 1.025 07/20/2007 1451   PHURINE 6.0 07/20/2007 1451   GLUCOSEU NEGATIVE 07/20/2007 1451   [place] 07/20/2007 1451   KETONESUR NEGATIVE 07/20/2007 1451   UROBILINOGEN 0.2 mg/dL 07/20/2007 1451   NITRITE Negative 07/20/2007 1451   LEUKOCYTESUR Negative 07/20/2007 1451    Radiological Exams on Admission: US Abdomen Limited RUQ (LIVER/GB)  Result Date: 11/29/2020 CLINICAL DATA:  34 year old female with right upper quadrant abdominal pain. EXAM: ULTRASOUND ABDOMEN LIMITED RIGHT UPPER QUADRANT COMPARISON:  None. FINDINGS: Gallbladder: There multiple stones within the gallbladder. No gallbladder wall thickening or pericholecystic fluid. Negative sonographic Murphy's sign. Common bile duct: Diameter: 3 mm Liver: There is diffuse increased liver echogenicity most commonly seen in the  setting of fatty infiltration. Superimposed inflammation or fibrosis is not excluded. Clinical correlation is recommended. Portal vein is patent on color Doppler imaging with normal direction of blood flow towards the liver. Other: None. IMPRESSION: 1. Cholelithiasis without sonographic evidence of acute cholecystitis. 2. Fatty liver. Electronically Signed   By: Anner Crete M.D.   On: 11/29/2020 17:27    EKG: Independently reviewed.  Normal sinus rhythm, 86 bpm.  Assessment/Plan Active Problems:   Pancreatitis  Pancreatitis > Etiology suspected to be gallstone based on presence of gallstones on abdominal ultrasound. > Preceding episodes of pain likely related to obstruction given intermittent nature > Lipase elevated to greater than 10,000.  And LFTs elevated and obstructive pattern with AST 286, ALT 534, alk phos 156, T bili 2.5. > EDP has consulted general surgery and GI.  GI has not yet called GEN surgery back. > Received a dose of Zosyn in the ED - Appreciate general surgery and GI recommendations - GI recommends MRCP - N.p.o., advance as tolerated tomorrow - As needed Dilaudid for pain control - Trend CMP and CBC - No additional antibiotics  History of seizures > Off medications for some time without recurrence  DVT prophylaxis: Lovenox  Code Status:   Full  Family Communication:  Significant other updated at bedside  Disposition Plan:   Patient is from:  Home  Anticipated DC to:  Home  Anticipated DC date:  Pending clinical course  Anticipated DC barriers: None  Consults called:  GI and general surgery consulted by EDP, both are to see the patient.   Admission status:  Inpatient, MedSurg  Severity of Illness: The appropriate patient status for this patient is INPATIENT. Inpatient status is judged to be reasonable and necessary in order to provide the required intensity of service to ensure the patient's safety. The patient's presenting symptoms, physical exam findings,  and initial radiographic and laboratory data in the context of their chronic comorbidities is felt to place them at high risk for further clinical deterioration. Furthermore, it is not anticipated that the patient will be medically stable for discharge from the hospital within 2 midnights of admission. The following factors support the patient status of inpatient.   " The patient's presenting symptoms include abdominal pain, nausea. " The worrisome physical exam findings include abdominal pain. " The initial radiographic and laboratory data are worrisome because of elevated LFTs and lipase greater than 10,000.  Ultrasound with cholelithiasis. " The chronic co-morbidities include seizures.   * I certify that at the point of admission it is my clinical judgment that the patient will require inpatient hospital care spanning beyond 2 midnights from the point of admission due to high intensity of service, high risk for further deterioration and high frequency of surveillance required.Marcelyn Bruins MD [facility]  How to contact the [facility] Attending or Consulting provider [place] or covering provider during after hours [place], for this patient?   1. Check the care team in [facility] and look for a) attending/consulting [facility] provider listed and b) the [facility] team listed 2. Log into [email or web address] and use [place]'s universal password to access. If you do not have the password, please contact the hospital operator. 3. Locate the [facility] provider you are looking for under [facility] and page to a number that you can be directly reached. 4. If you still have difficulty reaching the provider, please page the [facility] (Director on Call) for the Hospitalists listed on amion for assistance.  11/29/2020, 9:23 PM

## 2020-11-29 NOTE — ED Provider Notes (Signed)
[place] EMERGENCY DEPARTMENT Provider Note   CSN: [id number] Arrival date & time: 11/29/20  1551     History Chief Complaint  Patient presents with  . Chest Pain    Amy Pittman is a 33 y.o. female presenting to the emergency department with complaint of upper abdominal pain/lower chest pain that began this morning around 9am. No meals prior to onset.  Patient is sharp in nature and constant.  It does not radiate.  She felt a little bit nauseous earlier today without vomiting.  She states she has been having similar symptoms on and off for the last month or so.  She does not identify any modifying or alleviating factors.  Denies heartburn, shortness of breath, fever, urinary symptoms, alcohol abuse, frequent NSAID use.  No history of abdominal surgeries.  No history of GERD. No cardiac history.  She is concerned for her gallbladder.  The history is provided by the patient.       Past Medical History:  Diagnosis Date  . Generalized headaches   . Generalized seizures [facility]) neurologist--  per pt followed by [place] neurologist   per pt dx 2010 and per pt none since ,did have work-up negative , felt she was going to sleep Abscent seizure  and triggers are stress and exhaustion  . Menorrhagia   . Wears glasses     Patient Active Problem List   Diagnosis Date Noted  . Cellulitis of left upper eyelid 10/11/2018  . Low back pain 05/28/2015  . Awareness alteration, transient 03/07/2015  . Routine health maintenance 05/10/2012  . Generalized seizures ([place])   . Irregular menses     Past Surgical History:  Procedure Laterality Date  . ANKLE ARTHROSCOPY Right 07/09/2005     dr Mayer Camel  [facility]   w/ Debridement and removal loose body  . DILATATION & CURETTAGE/HYSTEROSCOPY WITH MYOSURE N/A 11/24/2017   Procedure: DILATATION & CURETTAGE/HYSTEROSCOPY;  Surgeon: Bobbye Charleston, MD;  Location: [place];  Service: Gynecology;  Laterality:  N/A;     OB History   No obstetric history on file.     Family History  Problem Relation Age of Onset  . Mental illness Father   . Arthritis Maternal Grandfather   . Cancer Maternal Grandfather        lung  . Heart disease Maternal Grandfather   . Stroke Maternal Grandfather   . Hypertension Maternal Grandfather   . Diabetes Maternal Grandfather     Social History   Tobacco Use  . Smoking status: Never Smoker  . Smokeless tobacco: Never Used  Vaping Use  . Vaping Use: Never used  Substance Use Topics  . Alcohol use: No    Alcohol/week: 0.0 standard drinks  . Drug use: No    Home Medications Prior to Admission medications   Medication Sig Start Date End Date Taking? Authorizing Provider  azithromycin (ZITHROMAX) 250 MG tablet Day 1 take 2 pills, days 2-5 take 1 pill daily. 10/11/18   Hoyt Koch, MD    Allergies    Patient has no known allergies.  Review of Systems   Review of Systems  Constitutional: Negative for fever.  Gastrointestinal: Positive for abdominal pain and nausea. Negative for constipation, diarrhea and vomiting.  Genitourinary: Negative for dysuria and frequency.  All other systems reviewed and are negative.   Physical Exam Updated Vital Signs BP 136/81   Pulse 78   Temp 98.3 F (36.8 C)   Resp 18   SpO2  98%   Physical Exam Vitals and nursing note reviewed.  Constitutional:      Appearance: She is well-developed and well-nourished.  HENT:     Head: Normocephalic and atraumatic.  Eyes:     Conjunctiva/sclera: Conjunctivae normal.  Cardiovascular:     Rate and Rhythm: Normal rate and regular rhythm.  Pulmonary:     Effort: Pulmonary effort is normal. No respiratory distress.     Breath sounds: Normal breath sounds.  Abdominal:     General: Bowel sounds are normal.     Palpations: Abdomen is soft.     Tenderness: There is abdominal tenderness in the right upper quadrant. There is no guarding or rebound.  Skin:     General: Skin is warm.  Neurological:     Mental Status: She is alert.  Psychiatric:        Mood and Affect: Mood and affect normal.        Behavior: Behavior normal.     ED Results / Procedures / Treatments   Labs (all labs ordered are listed, but only abnormal results are displayed) Labs Reviewed  LIPASE, BLOOD  COMPREHENSIVE METABOLIC PANEL  CBC WITH DIFFERENTIAL/PLATELET    EKG None  Radiology No results found.  Procedures Procedures (including critical care time)  Medications Ordered in ED Medications  morphine 4 MG/ML injection 4 mg (has no administration in time range)  ondansetron (ZOFRAN) injection 4 mg (has no administration in time range)    ED Course  I have reviewed the triage vital signs and the nursing notes.  Pertinent labs & imaging results that were available during my care of the patient were reviewed by me and considered in my medical decision making (see chart for details).  Clinical Course as of 11/29/20 2121  Sat Nov 29, 2020  1943 Ultrasound with cholelithiasis however without evidence of cholecystitis.  Blood work is resulted with leukocytosis and transaminitis.  Considering patient's presentation and laboratory work-up, concern for acute cholecystitis.  Consult placed to general surgeon Covid swab ordered. [JR]  2117 Dr. Trilby Drummer accepting admission. [JR]    Clinical Course User Index [JR] Robinson, [place] N, PA-C   MDM Rules/Calculators/A&P                          Patient presenting for epigastric abdominal pain, sharp and constant in nature, began this morning.  Has been having intermittent similar symptoms over the last month.  No modifying factors identified.  She does not drink alcohol or use frequent NSAIDs.  No respiratory symptoms.  Some nausea without vomiting, diarrhea or constipation.  No fever.  On exam she appears uncomfortable, she has focal tenderness in the right upper quadrant, no peritoneal signs.  Seems less likely cardiac  in nature.  Laboratory work-up, right upper quadrant ultrasound obtained.  EKG.  Pain medication and antiemetic ordered for symptom management.   Patient witnessed vomiting on her way back from ultrasound.  Ultrasound with cholelithiasis without evidence of cholecystitis, normal common bile duct.  Labs with leukocytosis of 68.3, metabolic panel with acute transaminitis with elevated AST, ALT, alk phos and T bili up to 2.5.  Her lipase is significantly elevated greater than 10,000.  Covid swab ordered.  Presentation and work-up concerning for gallstone pancreatitis.  Consulted with Dr. Georgette Dover with general surgery, agrees likely gallstone pancreatitis.  Recommends antibiotics, medicine admit and GI consult.  Does not believe CT scan is necessary at this time.  IV Zosyn and fluids  ordered.  Patient reports symptom improvement.  Agrees with plan for admission.  Consult placed to GI.  Patient admitted to hospitalist service, Dr. Trilby Drummer accepting admission.  Dr. Havery Moros with GI recommends MRCP for further assessment. GI to evaluate in the morning.  Final Clinical Impression(s) / ED Diagnoses Final diagnoses:  RUQ abdominal pain  Acute pancreatitis, unspecified complication status, unspecified pancreatitis type    Rx / DC Orders ED Discharge Orders    None       Robinson, [place] N, PA-C 11/29/20 2249    Deno Etienne, DO 11/30/20 1500

## 2020-11-29 NOTE — ED Triage Notes (Signed)
Pt reports chest pain intermittent since last night, denies any other symptoms.

## 2020-11-29 NOTE — ED Notes (Signed)
Pt reports RUQ pain intermittent x 1.5 months with recent episode starting today at 0900, Pt reports N/V as well.

## 2020-11-30 DIAGNOSIS — R1011 Right upper quadrant pain: Secondary | ICD-10-CM

## 2020-11-30 DIAGNOSIS — K859 Acute pancreatitis without necrosis or infection, unspecified: Secondary | ICD-10-CM

## 2020-11-30 DIAGNOSIS — K851 Biliary acute pancreatitis without necrosis or infection: Principal | ICD-10-CM

## 2020-11-30 DIAGNOSIS — K8050 Calculus of bile duct without cholangitis or cholecystitis without obstruction: Secondary | ICD-10-CM

## 2020-11-30 DIAGNOSIS — K8020 Calculus of gallbladder without cholecystitis without obstruction: Secondary | ICD-10-CM

## 2020-11-30 LAB — COMPREHENSIVE METABOLIC PANEL
ALT: 544 U/L — ABNORMAL HIGH (ref 0–44)
AST: 288 U/L — ABNORMAL HIGH (ref 15–41)
Albumin: 3.1 g/dL — ABNORMAL LOW (ref 3.5–5.0)
Alkaline Phosphatase: 139 U/L — ABNORMAL HIGH (ref 38–126)
Anion gap: 7 (ref 5–15)
BUN: 7 mg/dL (ref 6–20)
CO2: 24 mmol/L (ref 22–32)
Calcium: 8.3 mg/dL — ABNORMAL LOW (ref 8.9–10.3)
Chloride: 108 mmol/L (ref 98–111)
Creatinine, Ser: 0.86 mg/dL (ref 0.44–1.00)
GFR, Estimated: >60 mL/min (ref >60)
Glucose, Bld: 118 mg/dL — ABNORMAL HIGH (ref 70–99)
Potassium: 3.9 mmol/L (ref 3.5–5.1)
Sodium: 139 mmol/L (ref 135–145)
Total Bilirubin: 3 mg/dL — ABNORMAL HIGH (ref 0.3–1.2)
Total Protein: 5.8 g/dL — ABNORMAL LOW (ref 6.5–8.1)

## 2020-11-30 LAB — CBC
HCT: 38.8 % (ref 36.0–46.0)
Hemoglobin: 12.9 g/dL (ref 12.0–15.0)
MCH: 29.9 pg (ref 26.0–34.0)
MCHC: 33.2 g/dL (ref 30.0–36.0)
MCV: 89.8 fL (ref 80.0–100.0)
Platelets: 341 10*3/uL (ref 150–400)
RBC: 4.32 MIL/uL (ref 3.87–5.11)
RDW: 13.1 % (ref 11.5–15.5)
WBC: 14.2 10*3/uL — ABNORMAL HIGH (ref 4.0–10.5)
nRBC: 0 % (ref 0.0–0.2)

## 2020-11-30 MED ORDER — ONDANSETRON HCL 4 MG/2ML IJ SOLN: 4.0000 mg | Freq: Four times a day (QID) | INTRAMUSCULAR | Status: DC | PRN

## 2020-11-30 MED ORDER — PIPERACILLIN-TAZOBACTAM 3.375 G IVPB
3.3750 g | Freq: Three times a day (TID) | INTRAVENOUS | Status: DC
  Administered 2020-11-30 – 2020-12-02 (×7): 3.375 g via INTRAVENOUS
  Filled 2020-11-30 (×7): qty 50

## 2020-11-30 MED ORDER — POTASSIUM CHLORIDE 2 MEQ/ML IV SOLN
INTRAVENOUS | Status: DC
  Filled 2020-11-30 (×8): qty 1000
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
## 2020-11-30 NOTE — Consult Note (Signed)
Reason for Consult:abd pain Referring Physician: Dr. Lurline Del Oleda Borski is an 34 y.o. female.  HPI: The patient is a 34 year old white female who presents with abd pain that started yesterday. Pain was severe. Pain was associated with nausea and vomiting. She has had similar pains since November. She came to ER where U/S shows stones in the gallbladder. LFT's and lipase elevated. MRCP shows stones in the cbd  Past Medical History:  Diagnosis Date  . Generalized headaches   . 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 Menorrhagia   . Wears glasses     Past Surgical History:  Procedure Laterality Date  . ANKLE ARTHROSCOPY Right 07/09/2005     dr Turner Daniels  [facility]   w/ Debridement and removal loose body  . DILATATION & CURETTAGE/HYSTEROSCOPY WITH MYOSURE N/A 11/24/2017   Procedure: DILATATION & CURETTAGE/HYSTEROSCOPY;  Surgeon: Carrington Clamp, MD;  Location: [facility];  Service: Gynecology;  Laterality: N/A;    Family History  Problem Relation Age of Onset  . Mental illness Father   . Arthritis Maternal Grandfather   . Cancer Maternal Grandfather        lung  . Heart disease Maternal Grandfather   . Stroke Maternal Grandfather   . Hypertension Maternal Grandfather   . Diabetes Maternal Grandfather     Social History:  reports that she has never smoked. She has never used smokeless tobacco. She reports that she does not drink alcohol and does not use drugs.  Allergies: No Known Allergies  Medications: I have reviewed the patient's current medications.  Results for orders placed or performed during the hospital encounter of 11/29/20 (from the past 48 hour(s))  Lipase, blood     Status: Abnormal   Collection Time: 11/29/20  6:28 PM  Result Value Ref Range    Lipase >10,000 (H) 11 - 51 U/L    Comment: RESULTS CONFIRMED BY MANUAL DILUTION Performed at [facility], [address], [place], [place] [address]   Comprehensive metabolic panel     Status: Abnormal   Collection Time: 11/29/20  6:28 PM  Result Value Ref Range   Sodium 140 135 - 145 mmol/L   Potassium 3.6 3.5 - 5.1 mmol/L   Chloride 107 98 - 111 mmol/L   CO2 22 22 - 32 mmol/L   Glucose, Bld 122 (H) 70 - 99 mg/dL    Comment: Glucose reference range applies only to samples taken after fasting for at least 8 hours.   BUN 6 6 - 20 mg/dL   Creatinine, Ser 4.81 0.44 - 1.00 mg/dL   Calcium 9.6 8.9 - 85.6 mg/dL   Total Protein 7.3 6.5 - 8.1 g/dL   Albumin 4.1 3.5 - 5.0 g/dL   AST 314 (H) 15 - 41 U/L   ALT 574 (H) 0 - 44 U/L   Alkaline Phosphatase 156 (H) 38 - 126 U/L   Total Bilirubin 2.5 (H) 0.3 - 1.2 mg/dL   GFR, Estimated >97 >02 mL/min    Comment: (NOTE) Calculated using the CKD-EPI Creatinine Equation (2021)    Anion gap 11 5 - 15    Comment: Performed at [facility], [address], [place], [place] [address]  CBC with Differential     Status:  Abnormal   Collection Time: 11/29/20  6:28 PM  Result Value Ref Range   WBC 15.4 (H) 4.0 - 10.5 K/uL   RBC 4.81 3.87 - 5.11 MIL/uL   Hemoglobin 14.7 12.0 - 15.0 g/dL   HCT 02.7 25.3 - 66.4 %   MCV 89.6 80.0 - 100.0 fL   MCH 30.6 26.0 - 34.0 pg   MCHC 34.1 30.0 - 36.0 g/dL   RDW 40.3 47.4 - 25.9 %   Platelets 393 150 - 400 K/uL   nRBC 0.0 0.0 - 0.2 %   Neutrophils Relative % 84 %   Neutro Abs 13.0 (H) 1.7 - 7.7 K/uL   Lymphocytes Relative 8 %   Lymphs Abs 1.2 0.7 - 4.0 K/uL   Monocytes Relative 7 %   Monocytes Absolute 1.1 (H) 0.1 - 1.0 K/uL   Eosinophils Relative 0 %   Eosinophils Absolute 0.0 0.0 - 0.5 K/uL   Basophils Relative 0 %   Basophils Absolute 0.1 0.0 - 0.1 K/uL   Immature Granulocytes 1 %   Abs Immature Granulocytes 0.07 0.00 - 0.07 K/uL    Comment: Performed at [facility], [address], [place], [place] [address]  Resp Panel by RT-PCR (Flu A&B, Covid) Nasopharyngeal Swab     Status: None   Collection Time: 11/29/20  8:21 PM   Specimen: Nasopharyngeal Swab; Nasopharyngeal(NP) swabs in vial transport medium  Result Value Ref Range   SARS Coronavirus 2 by RT PCR NEGATIVE NEGATIVE    Comment: (NOTE) SARS-CoV-2 target nucleic acids are NOT DETECTED.  The SARS-CoV-2 RNA is generally detectable in upper respiratory specimens during the acute phase of infection. The lowest concentration of SARS-CoV-2 viral copies this assay can detect is 138 copies/mL. A negative result does not preclude SARS-Cov-2 infection and should not be used as the sole basis for treatment or other patient management decisions. A negative result may occur with  improper specimen collection/handling, submission of specimen other than nasopharyngeal swab, presence of viral mutation(s) within the areas targeted by this assay, and inadequate number of viral copies(<138 copies/mL). A negative result must be combined with clinical observations, patient history, and epidemiological information. The expected result is Negative.  Fact Sheet for Patients:  BloggerCourse.com  Fact Sheet for Healthcare Providers:  SeriousBroker.it  This test is no t yet approved or cleared by the [place] FDA and  has been authorized for detection and/or diagnosis of SARS-CoV-2 by FDA under an Emergency Use Authorization (EUA). This EUA will remain  in effect (meaning this test can be used) for the duration of the COVID-19 declaration under Section 564(b)(1) of the Act, 21 U.S.C.section 360bbb-3(b)(1), unless the authorization is terminated  or revoked sooner.       Influenza A by PCR NEGATIVE NEGATIVE   Influenza B by PCR NEGATIVE NEGATIVE    Comment: (NOTE) The Xpert Xpress SARS-CoV-2/FLU/RSV plus assay is intended as an aid in the diagnosis of influenza from  Nasopharyngeal swab specimens and should not be used as a sole basis for treatment. Nasal washings and aspirates are unacceptable for Xpert Xpress SARS-CoV-2/FLU/RSV testing.  Fact Sheet for Patients: BloggerCourse.com  Fact Sheet for Healthcare Providers: SeriousBroker.it  This test is not yet approved or cleared by the [place] FDA and has been authorized for detection and/or diagnosis of SARS-CoV-2 by FDA under an Emergency Use Authorization (EUA). This EUA will remain in effect (meaning this test can be used) for the duration of the COVID-19 declaration under Section 564(b)(1)  of the Act, 21 U.S.C. section 360bbb-3(b)(1), unless the authorization is terminated or revoked.  Performed at [facility], [address], [place], [place] [address]   Comprehensive metabolic panel     Status: Abnormal   Collection Time: 11/30/20  4:25 AM  Result Value Ref Range   Sodium 139 135 - 145 mmol/L   Potassium 3.9 3.5 - 5.1 mmol/L   Chloride 108 98 - 111 mmol/L   CO2 24 22 - 32 mmol/L   Glucose, Bld 118 (H) 70 - 99 mg/dL    Comment: Glucose reference range applies only to samples taken after fasting for at least 8 hours.   BUN 7 6 - 20 mg/dL   Creatinine, Ser 1.82 0.44 - 1.00 mg/dL   Calcium 8.3 (L) 8.9 - 10.3 mg/dL   Total Protein 5.8 (L) 6.5 - 8.1 g/dL   Albumin 3.1 (L) 3.5 - 5.0 g/dL   AST 993 (H) 15 - 41 U/L   ALT 544 (H) 0 - 44 U/L   Alkaline Phosphatase 139 (H) 38 - 126 U/L   Total Bilirubin 3.0 (H) 0.3 - 1.2 mg/dL   GFR, Estimated >71 >69 mL/min    Comment: (NOTE) Calculated using the CKD-EPI Creatinine Equation (2021)    Anion gap 7 5 - 15    Comment: Performed at [facility], [address], [place], [place] [address]  CBC     Status: Abnormal   Collection Time: 11/30/20  4:25 AM  Result Value Ref Range   WBC 14.2 (H) 4.0 - 10.5 K/uL   RBC 4.32 3.87 - 5.11 MIL/uL   Hemoglobin 12.9 12.0 - 15.0 g/dL    HCT 81.0 17.5 - 10.2 %   MCV 89.8 80.0 - 100.0 fL   MCH 29.9 26.0 - 34.0 pg   MCHC 33.2 30.0 - 36.0 g/dL   RDW 58.5 27.7 - 82.4 %   Platelets 341 150 - 400 K/uL   nRBC 0.0 0.0 - 0.2 %    Comment: Performed at [facility], [address], [place], [place] [address]    MR ABDOMEN MRCP W WO CONTAST  Result Date: 11/30/2020 CLINICAL DATA:  Cholelithiasis.  Pancreatitis. EXAM: MRI ABDOMEN WITHOUT AND WITH CONTRAST (INCLUDING MRCP) TECHNIQUE: Multiplanar multisequence MR imaging of the abdomen was performed both before and after the administration of intravenous contrast. Heavily T2-weighted images of the biliary and pancreatic ducts were obtained, and three-dimensional MRCP images were rendered by post processing. CONTRAST:  45mL GADAVIST GADOBUTROL 1 MMOL/ML IV SOLN COMPARISON:  Ultrasound exam earlier same day. FINDINGS: Lower chest: Unremarkable Hepatobiliary: 9 mm flash filling hypervascular focus in the subcapsular aspect of segment IV rapidly washes out to background hepatic parenchymal levels. This finding cannot be definitively characterized but is most likely benign and may be a small vascular malformation. Gallbladder is distended with gallbladder wall edema. Mild intrahepatic biliary duct dilatation evident. Common duct measures 10 mm in the hepatoduodenal ligament. Common bile duct is 10 mm diameter just proximal to the ampulla. There is layering subtle debris in the distal common bile duct seen only on axial T2 imaging (for example axial haste image 22 of series 3). This debris could represent tiny layering stones or sludge. There is no increased signal on T1 imaging to suggest blood products. Imaging appearance is not typical for flow artifact. Pancreas: There is diffuse intrapancreatic and peripancreatic edema without main duct dilatation. Pancreatic parenchyma enhances throughout. No focal fluid collection. Spleen:  No  splenomegaly. No focal mass lesion. Adrenals/Urinary Tract: No  adrenal nodule or mass. Kidneys unremarkable. Stomach/Bowel: Stomach is unremarkable. No gastric wall thickening. No evidence of outlet obstruction. Duodenum is normally positioned as is the ligament of Treitz. No small bowel or colonic dilatation within the visualized abdomen. Vascular/Lymphatic: No abdominal aortic aneurysm. No abdominal aortic atherosclerotic calcification. Other:  Small volume intraperitoneal free fluid. Musculoskeletal: No focal suspicious marrow enhancement within the visualized bony anatomy. IMPRESSION: 1. Mild intrahepatic and extrahepatic biliary duct dilatation. There is layering debris in the distal common bile duct seen only on axial T2 imaging. This could represent tiny layering stones or sludge. 2. Diffuse intrapancreatic and peripancreatic edema without main duct dilatation. No evidence for pancreatic necrosis. No parapancreatic fluid collection. 3. 9 mm flash filling hypervascular focus in the subcapsular aspect of segment IV rapidly washes out to background hepatic parenchymal levels. This finding cannot be definitively characterized but is most likely benign and may be a small vascular malformation. Electronically Signed   By: Misty Stanley M.D.   On: 11/30/2020 04:17   US Abdomen Limited RUQ (LIVER/GB)  Result Date: 11/29/2020 CLINICAL DATA:  34 year old female with right upper quadrant abdominal pain. EXAM: ULTRASOUND ABDOMEN LIMITED RIGHT UPPER QUADRANT COMPARISON:  None. FINDINGS: Gallbladder: There multiple stones within the gallbladder. No gallbladder wall thickening or pericholecystic fluid. Negative sonographic Murphy's sign. Common bile duct: Diameter: 3 mm Liver: There is diffuse increased liver echogenicity most commonly seen in the setting of fatty infiltration. Superimposed inflammation or fibrosis is not excluded. Clinical correlation is recommended. Portal vein is patent on color Doppler imaging with normal direction of blood flow towards the liver. Other: None.  IMPRESSION: 1. Cholelithiasis without sonographic evidence of acute cholecystitis. 2. Fatty liver. Electronically Signed   By: Anner Crete M.D.   On: 11/29/2020 17:27    Review of Systems  Constitutional: Negative.   HENT: Negative.   Eyes: Negative.   Respiratory: Negative.   Cardiovascular: Negative.   Gastrointestinal: Positive for abdominal pain, nausea and vomiting.  Endocrine: Negative.   Genitourinary: Negative.   Musculoskeletal: Negative.   Skin: Negative.   Allergic/Immunologic: Negative.   Neurological: Negative.   Hematological: Negative.   Psychiatric/Behavioral: Negative.    Blood pressure 111/72, pulse 74, temperature 99.2 F (37.3 C), temperature source Oral, resp. rate 18, height 5\' 5"  (1.651 m), weight 90.7 kg, SpO2 97 %. Physical Exam Vitals reviewed.  Constitutional:      General: She is not in acute distress.    Appearance: She is well-developed. She is not ill-appearing.  HENT:     Head: Normocephalic and atraumatic.     Right Ear: External ear normal.     Left Ear: External ear normal.     Nose: Nose normal.     Mouth/Throat:     Mouth: Mucous membranes are moist.     Pharynx: Oropharynx is clear.  Eyes:     General: No scleral icterus.    Extraocular Movements: Extraocular movements intact.     Conjunctiva/sclera: Conjunctivae normal.     Pupils: Pupils are equal, round, and reactive to light.  Cardiovascular:     Rate and Rhythm: Normal rate and regular rhythm.     Pulses: Normal pulses.     Heart sounds: Normal heart sounds.     Comments: No pitting edema lower extr Pulmonary:     Effort: Pulmonary effort is normal. No respiratory distress.     Breath sounds: Normal breath sounds. No stridor.  Abdominal:  General: Abdomen is flat. There is no distension.     Palpations: Abdomen is soft.     Tenderness: There is abdominal tenderness.     Comments: Mild to moderate RUQ pain  Musculoskeletal:        General: No swelling, tenderness  or deformity. Normal range of motion.     Cervical back: Normal range of motion and neck supple. No tenderness.  Skin:    General: Skin is warm and dry.     Coloration: Skin is not jaundiced.     Findings: No rash.  Neurological:     General: No focal deficit present.     Mental Status: She is alert and oriented to person, place, and time.  Psychiatric:        Mood and Affect: Mood normal.        Behavior: Behavior normal.     Assessment/Plan: The patient appears to have gallstone pancreatitis with cbd stones. She will need ERCP which is planned for tomorrow. She would likely benefit from having her gallbladder removed during this hospitalization. Continue bowel rest for now. Will follow  Autumn Messing III 11/30/2020, 12:02 PM

## 2020-11-30 NOTE — Progress Notes (Signed)
PROGRESS NOTE    Amy Pittman  QNV:[id number] DOB: 07/13/1986 DOA: 11/29/2020 PCP: Myrlene Broker, MD    Brief Narrative:  34 y/o female admitted with abdominal pain and vomiting. Found to have gallstone pancreatitis. GI and Gen surgery following. Plans for possible ERCP and cholecystectomy prior to discharge.   Assessment & Plan:   Active Problems:   Pancreatitis   Gallstones   Choledocholithiasis   Gallstone pancreatitis -noted to have cholelithiasis on [place] -mild biliary ductal dilatation with layering debris in the distal CBD -GI following -possible ERCP in AM, keep NPO after midnight -Lipase elevated at >10,000 -continue pain management, IV fluids and bowel rest -recheck labs in AM -Gen surgery evaluated patient with likely plans to perform cholecystectomy prior to discharge home -continue antibiotic coverage with zosyn    DVT prophylaxis: lovenox  Code Status: full code Family Communication: discussed with mother at the bedside Disposition Plan: Status is: Inpatient  Remains inpatient appropriate because:Ongoing active pain requiring inpatient pain management, Ongoing diagnostic testing needed not appropriate for outpatient work up and IV treatments appropriate due to intensity of illness or inability to take PO   Dispo: The patient is from: Home              Anticipated d/c is to: Home              Anticipated d/c date is: 3 days              Patient currently is not medically stable to d/c.     Consultants:   GI  Gen Surg  Procedures:     Antimicrobials:   Zosyn 12/25>    Subjective: No further nausea or vomiting. Continues to have abdominal pain which she rates at 3-4/10  Objective: Vitals:   11/29/20 1601 11/29/20 1955 11/29/20 2339 11/30/20 0544  BP: 136/81 118/72 113/79 111/72  Pulse: 78 74 68 74  Resp: 18 16 18 18   Temp: 98.3 F (36.8 C)  97.9 F (36.6 C) 99.2 F (37.3 C)  TempSrc:   Oral Oral  SpO2: 98% 98%  99% 97%  Weight:   90.7 kg   Height:   5\' 5"  (1.651 m)     Intake/Output Summary (Last 24 hours) at 11/30/2020 1528 Last data filed at 11/30/2020 0414 Gross per 24 hour  Intake 1489.08 ml  Output --  Net 1489.08 ml   Filed Weights   11/29/20 2339  Weight: 90.7 kg    Examination:  General exam: Appears calm and comfortable  Respiratory system: Clear to auscultation. Respiratory effort normal. Cardiovascular system: S1 & S2 heard, RRR. No JVD, murmurs, rubs, gallops or clicks. No pedal edema. Gastrointestinal system: Abdomen is nondistended, soft and tender in RUQ. No organomegaly or masses felt. Normal bowel sounds heard. Central nervous system: Alert and oriented. No focal neurological deficits. Extremities: Symmetric 5 x 5 power. Skin: No rashes, lesions or ulcers Psychiatry: Judgement and insight appear normal. Mood & affect appropriate.     Data Reviewed: I have personally reviewed following labs and imaging studies  CBC: Recent Labs  Lab 11/29/20 1828 11/30/20 0425  WBC 15.4* 14.2*  NEUTROABS 13.0*  --   HGB 14.7 12.9  HCT 43.1 38.8  MCV 89.6 89.8  PLT 393 341   Basic Metabolic Panel: Recent Labs  Lab 11/29/20 1828 11/30/20 0425  NA 140 139  K 3.6 3.9  CL 107 108  CO2 22 24  GLUCOSE 122* 118*  BUN 6 7  CREATININE  0.80 0.86  CALCIUM 9.6 8.3*   GFR: Estimated Creatinine Clearance: 100.6 mL/min (by C-G formula based on SCr of 0.86 mg/dL). Liver Function Tests: Recent Labs  Lab 11/29/20 1828 11/30/20 0425  AST 286* 288*  ALT 574* 544*  ALKPHOS 156* 139*  BILITOT 2.5* 3.0*  PROT 7.3 5.8*  ALBUMIN 4.1 3.1*   Recent Labs  Lab 11/29/20 1828  LIPASE >10,000*   No results for input(s): AMMONIA in the last 168 hours. Coagulation Profile: No results for input(s): INR, PROTIME in the last 168 hours. Cardiac Enzymes: No results for input(s): CKTOTAL, CKMB, CKMBINDEX, TROPONINI in the last 168 hours. BNP (last 3 results) No results for input(s):  PROBNP in the last 8760 hours. HbA1C: No results for input(s): HGBA1C in the last 72 hours. CBG: No results for input(s): GLUCAP in the last 168 hours. Lipid Profile: No results for input(s): CHOL, HDL, LDLCALC, TRIG, CHOLHDL, LDLDIRECT in the last 72 hours. Thyroid Function Tests: No results for input(s): TSH, T4TOTAL, FREET4, T3FREE, THYROIDAB in the last 72 hours. Anemia Panel: No results for input(s): VITAMINB12, FOLATE, FERRITIN, TIBC, IRON, RETICCTPCT in the last 72 hours. Sepsis Labs: No results for input(s): PROCALCITON, LATICACIDVEN in the last 168 hours.  Recent Results (from the past 240 hour(s))  Resp Panel by RT-PCR (Flu A&B, Covid) Nasopharyngeal Swab     Status: None   Collection Time: 11/29/20  8:21 PM   Specimen: Nasopharyngeal Swab; Nasopharyngeal(NP) swabs in vial transport medium  Result Value Ref Range Status   SARS Coronavirus 2 by RT PCR NEGATIVE NEGATIVE Final    Comment: (NOTE) SARS-CoV-2 target nucleic acids are NOT DETECTED.  The SARS-CoV-2 RNA is generally detectable in upper respiratory specimens during the acute phase of infection. The lowest concentration of SARS-CoV-2 viral copies this assay can detect is 138 copies/mL. A negative result does not preclude SARS-Cov-2 infection and should not be used as the sole basis for treatment or other patient management decisions. A negative result may occur with  improper specimen collection/handling, submission of specimen other than nasopharyngeal swab, presence of viral mutation(s) within the areas targeted by this assay, and inadequate number of viral copies(<138 copies/mL). A negative result must be combined with clinical observations, patient history, and epidemiological information. The expected result is Negative.  Fact Sheet for Patients:  EntrepreneurPulse.com.au  Fact Sheet for Healthcare Providers:  IncredibleEmployment.be  This test is no t yet approved or  cleared by the [place] FDA and  has been authorized for detection and/or diagnosis of SARS-CoV-2 by FDA under an Emergency Use Authorization (EUA). This EUA will remain  in effect (meaning this test can be used) for the duration of the COVID-19 declaration under Section 564(b)(1) of the Act, 21 U.S.C.section 360bbb-3(b)(1), unless the authorization is terminated  or revoked sooner.       Influenza A by PCR NEGATIVE NEGATIVE Final   Influenza B by PCR NEGATIVE NEGATIVE Final    Comment: (NOTE) The Xpert Xpress SARS-CoV-2/FLU/RSV plus assay is intended as an aid in the diagnosis of influenza from Nasopharyngeal swab specimens and should not be used as a sole basis for treatment. Nasal washings and aspirates are unacceptable for Xpert Xpress SARS-CoV-2/FLU/RSV testing.  Fact Sheet for Patients: EntrepreneurPulse.com.au  Fact Sheet for Healthcare Providers: IncredibleEmployment.be  This test is not yet approved or cleared by the [place] FDA and has been authorized for detection and/or diagnosis of SARS-CoV-2 by FDA under an Emergency Use Authorization (EUA). This EUA will remain in effect (meaning  this test can be used) for the duration of the COVID-19 declaration under Section 564(b)(1) of the Act, 21 U.S.C. section 360bbb-3(b)(1), unless the authorization is terminated or revoked.  Performed at [facility], [place] [address], [place], [place] [address]          Radiology Studies: MR ABDOMEN MRCP W WO CONTAST  Result Date: 11/30/2020 CLINICAL DATA:  Cholelithiasis.  Pancreatitis. EXAM: MRI ABDOMEN WITHOUT AND WITH CONTRAST (INCLUDING MRCP) TECHNIQUE: Multiplanar multisequence MR imaging of the abdomen was performed both before and after the administration of intravenous contrast. Heavily T2-weighted images of the biliary and pancreatic ducts were obtained, and three-dimensional MRCP images were rendered by post processing.  CONTRAST:  57mL GADAVIST GADOBUTROL 1 MMOL/ML IV SOLN COMPARISON:  Ultrasound exam earlier same day. FINDINGS: Lower chest: Unremarkable Hepatobiliary: 9 mm flash filling hypervascular focus in the subcapsular aspect of segment IV rapidly washes out to background hepatic parenchymal levels. This finding cannot be definitively characterized but is most likely benign and may be a small vascular malformation. Gallbladder is distended with gallbladder wall edema. Mild intrahepatic biliary duct dilatation evident. Common duct measures 10 mm in the hepatoduodenal ligament. Common bile duct is 10 mm diameter just proximal to the ampulla. There is layering subtle debris in the distal common bile duct seen only on axial T2 imaging (for example axial haste image 22 of series 3). This debris could represent tiny layering stones or sludge. There is no increased signal on T1 imaging to suggest blood products. Imaging appearance is not typical for flow artifact. Pancreas: There is diffuse intrapancreatic and peripancreatic edema without main duct dilatation. Pancreatic parenchyma enhances throughout. No focal fluid collection. Spleen:  No splenomegaly. No focal mass lesion. Adrenals/Urinary Tract: No adrenal nodule or mass. Kidneys unremarkable. Stomach/Bowel: Stomach is unremarkable. No gastric wall thickening. No evidence of outlet obstruction. Duodenum is normally positioned as is the ligament of Treitz. No small bowel or colonic dilatation within the visualized abdomen. Vascular/Lymphatic: No abdominal aortic aneurysm. No abdominal aortic atherosclerotic calcification. Other:  Small volume intraperitoneal free fluid. Musculoskeletal: No focal suspicious marrow enhancement within the visualized bony anatomy. IMPRESSION: 1. Mild intrahepatic and extrahepatic biliary duct dilatation. There is layering debris in the distal common bile duct seen only on axial T2 imaging. This could represent tiny layering stones or sludge. 2.  Diffuse intrapancreatic and peripancreatic edema without main duct dilatation. No evidence for pancreatic necrosis. No parapancreatic fluid collection. 3. 9 mm flash filling hypervascular focus in the subcapsular aspect of segment IV rapidly washes out to background hepatic parenchymal levels. This finding cannot be definitively characterized but is most likely benign and may be a small vascular malformation. Electronically Signed   By: Misty Stanley M.D.   On: 11/30/2020 04:17   US Abdomen Limited RUQ (LIVER/GB)  Result Date: 11/29/2020 CLINICAL DATA:  34 year old female with right upper quadrant abdominal pain. EXAM: ULTRASOUND ABDOMEN LIMITED RIGHT UPPER QUADRANT COMPARISON:  None. FINDINGS: Gallbladder: There multiple stones within the gallbladder. No gallbladder wall thickening or pericholecystic fluid. Negative sonographic Murphy's sign. Common bile duct: Diameter: 3 mm Liver: There is diffuse increased liver echogenicity most commonly seen in the setting of fatty infiltration. Superimposed inflammation or fibrosis is not excluded. Clinical correlation is recommended. Portal vein is patent on color Doppler imaging with normal direction of blood flow towards the liver. Other: None. IMPRESSION: 1. Cholelithiasis without sonographic evidence of acute cholecystitis. 2. Fatty liver. Electronically Signed   By: Anner Crete M.D.   On:  11/29/2020 17:27        Scheduled Meds: . sodium chloride flush  3 mL Intravenous Q12H   Continuous Infusions: . sodium chloride 100 mL/hr at 11/30/20 1007  . piperacillin-tazobactam (ZOSYN)  IV 3.375 g (11/30/20 1411)     LOS: 1 day    Time spent:    Erick Blinks, MD [facility]   If 7PM-7AM, please contact night-coverage [email or web address]  11/30/2020, 3:28 PM

## 2020-12-01 ENCOUNTER — Inpatient Hospital Stay (HOSPITAL_COMMUNITY): Payer: 59 | Admitting: Certified Registered"

## 2020-12-01 ENCOUNTER — Encounter (HOSPITAL_COMMUNITY)
Admission: EM | Disposition: A | Discharge status: Home / Self Care | Payer: Self-pay | Source: Home / Self Care | Attending: Internal Medicine

## 2020-12-01 ENCOUNTER — Encounter (HOSPITAL_COMMUNITY): Payer: Self-pay | Admitting: Internal Medicine

## 2020-12-01 ENCOUNTER — Other Ambulatory Visit: Payer: Self-pay | Admitting: Physician Assistant

## 2020-12-01 ENCOUNTER — Inpatient Hospital Stay (HOSPITAL_COMMUNITY): Payer: 59

## 2020-12-01 DIAGNOSIS — K838 Other specified diseases of biliary tract: Secondary | ICD-10-CM

## 2020-12-01 DIAGNOSIS — R1011 Right upper quadrant pain: Secondary | ICD-10-CM

## 2020-12-01 HISTORY — PX: REMOVAL OF STONES: SHX5545

## 2020-12-01 HISTORY — PX: SPHINCTEROTOMY: SHX5544

## 2020-12-01 HISTORY — PX: ERCP: SHX5425

## 2020-12-01 HISTORY — PX: BIOPSY: SHX5522

## 2020-12-01 LAB — COMPREHENSIVE METABOLIC PANEL
ALT: 385 U/L — ABNORMAL HIGH (ref 0–44)
AST: 126 U/L — ABNORMAL HIGH (ref 15–41)
Albumin: 3 g/dL — ABNORMAL LOW (ref 3.5–5.0)
Alkaline Phosphatase: 175 U/L — ABNORMAL HIGH (ref 38–126)
Anion gap: 11 (ref 5–15)
BUN: 7 mg/dL (ref 6–20)
CO2: 21 mmol/L — ABNORMAL LOW (ref 22–32)
Calcium: 8.3 mg/dL — ABNORMAL LOW (ref 8.9–10.3)
Chloride: 106 mmol/L (ref 98–111)
Creatinine, Ser: 0.82 mg/dL (ref 0.44–1.00)
GFR, Estimated: >60 mL/min (ref >60)
Glucose, Bld: 78 mg/dL (ref 70–99)
Potassium: 3.4 mmol/L — ABNORMAL LOW (ref 3.5–5.1)
Sodium: 138 mmol/L (ref 135–145)
Total Bilirubin: 1.5 mg/dL — ABNORMAL HIGH (ref 0.3–1.2)
Total Protein: 5.5 g/dL — ABNORMAL LOW (ref 6.5–8.1)

## 2020-12-01 LAB — CBC
HCT: 34.1 % — ABNORMAL LOW (ref 36.0–46.0)
Hemoglobin: 11.7 g/dL — ABNORMAL LOW (ref 12.0–15.0)
MCH: 30.5 pg (ref 26.0–34.0)
MCHC: 34.3 g/dL (ref 30.0–36.0)
MCV: 88.8 fL (ref 80.0–100.0)
Platelets: 304 10*3/uL (ref 150–400)
RBC: 3.84 MIL/uL — ABNORMAL LOW (ref 3.87–5.11)
RDW: 13.5 % (ref 11.5–15.5)
WBC: 11.8 10*3/uL — ABNORMAL HIGH (ref 4.0–10.5)
nRBC: 0 % (ref 0.0–0.2)

## 2020-12-01 LAB — LIPASE, BLOOD: Lipase: 965 U/L — ABNORMAL HIGH (ref 11–51)

## 2020-12-01 LAB — HIV ANTIBODY (ROUTINE TESTING W REFLEX): HIV Screen 4th Generation wRfx: NONREACTIVE

## 2020-12-01 SURGERY — ERCP, WITH INTERVENTION IF INDICATED
Anesthesia: General

## 2020-12-01 MED ORDER — ROCURONIUM BROMIDE 10 MG/ML (PF) SYRINGE
PREFILLED_SYRINGE | INTRAVENOUS | Status: DC | PRN
  Administered 2020-12-01: 50 mg via INTRAVENOUS

## 2020-12-01 MED ORDER — FENTANYL CITRATE (PF) 100 MCG/2ML IJ SOLN
INTRAMUSCULAR | Status: DC | PRN
  Administered 2020-12-01 (×2): 50 ug via INTRAVENOUS

## 2020-12-01 MED ORDER — SUGAMMADEX SODIUM 200 MG/2ML IV SOLN
INTRAVENOUS | Status: DC | PRN
  Administered 2020-12-01: 180 mg via INTRAVENOUS

## 2020-12-01 MED ORDER — INDOMETHACIN 50 MG RE SUPP
RECTAL | Status: DC | PRN
  Administered 2020-12-01: 100 mg via RECTAL

## 2020-12-01 MED ORDER — MIDAZOLAM HCL 2 MG/2ML IJ SOLN
INTRAMUSCULAR | Status: AC
  Filled 2020-12-01: qty 2

## 2020-12-01 MED ORDER — PANTOPRAZOLE SODIUM 40 MG PO TBEC
40.0000 mg | DELAYED_RELEASE_TABLET | Freq: Every day | ORAL | Status: DC
  Administered 2020-12-02: 40 mg via ORAL
  Filled 2020-12-01: qty 1

## 2020-12-01 MED ORDER — SUCCINYLCHOLINE CHLORIDE 200 MG/10ML IV SOSY
PREFILLED_SYRINGE | INTRAVENOUS | Status: DC | PRN
  Administered 2020-12-01: 120 mg via INTRAVENOUS

## 2020-12-01 MED ORDER — GLUCAGON HCL RDNA (DIAGNOSTIC) 1 MG IJ SOLR
INTRAMUSCULAR | Status: DC | PRN
  Administered 2020-12-01: .25 mg via INTRAVENOUS
  Administered 2020-12-01: .2 mg via INTRAVENOUS
  Administered 2020-12-01: .25 mg via INTRAVENOUS

## 2020-12-01 MED ORDER — PROPOFOL 10 MG/ML IV BOLUS
INTRAVENOUS | Status: DC | PRN
Start: 2020-12-01 — End: 2020-12-01
  Administered 2020-12-01: 150 mg via INTRAVENOUS

## 2020-12-01 MED ORDER — MIDAZOLAM HCL 5 MG/5ML IJ SOLN
INTRAMUSCULAR | Status: DC | PRN
  Administered 2020-12-01: 2 mg via INTRAVENOUS

## 2020-12-01 MED ORDER — DEXAMETHASONE SODIUM PHOSPHATE 10 MG/ML IJ SOLN
INTRAMUSCULAR | Status: DC | PRN
  Administered 2020-12-01: 10 mg via INTRAVENOUS

## 2020-12-01 MED ORDER — ONDANSETRON HCL 4 MG/2ML IJ SOLN
INTRAMUSCULAR | Status: DC | PRN
  Administered 2020-12-01: 4 mg via INTRAVENOUS

## 2020-12-01 MED ORDER — INDOMETHACIN 50 MG RE SUPP
RECTAL | Status: AC
  Filled 2020-12-01: qty 2

## 2020-12-01 MED ORDER — LIDOCAINE 2% (20 MG/ML) 5 ML SYRINGE
INTRAMUSCULAR | Status: DC | PRN
  Administered 2020-12-01: 30 mg via INTRAVENOUS

## 2020-12-01 MED ORDER — SCOPOLAMINE 1 MG/3DAYS TD PT72
MEDICATED_PATCH | TRANSDERMAL | Status: DC | PRN
  Administered 2020-12-01: 1 via TRANSDERMAL

## 2020-12-01 MED ORDER — FENTANYL CITRATE (PF) 100 MCG/2ML IJ SOLN
INTRAMUSCULAR | Status: AC
  Filled 2020-12-01: qty 2

## 2020-12-01 MED ORDER — LACTATED RINGERS IV SOLN: INTRAVENOUS | Status: DC | PRN

## 2020-12-01 MED ORDER — GLUCAGON HCL RDNA (DIAGNOSTIC) 1 MG IJ SOLR
INTRAMUSCULAR | Status: AC
  Filled 2020-12-01: qty 1

## 2020-12-01 NOTE — Progress Notes (Signed)
POST ERCP NOTE:  Pt doing well. No abdominal pain or nausea or melena. Vitals stable. Abdominal Exam: soft, nontender.  Advance diet to full liquid diet. Will keep her n.p.o. after midnight in case of lap chole Discussed with pt. She would need repeat imaging of pancreas in 6-8 weeks to make sure she does not develop any pseudocyst.  [address]

## 2020-12-01 NOTE — Progress Notes (Signed)
PROGRESS NOTE    Amy Pittman  HEN:[id number] DOB: [birth year] DOA: 11/29/2020 PCP: Hoyt Koch, MD    Brief Narrative:  34 y/o female admitted with abdominal pain and vomiting. Found to have gallstone pancreatitis. GI and Gen surgery following. Plans for possible ERCP and cholecystectomy prior to discharge.   Assessment & Plan:   Active Problems:   Pancreatitis   Gallstones   Choledocholithiasis   RUQ abdominal pain   Gallstone pancreatitis -noted to have cholelithiasis on [place] -mild biliary ductal dilatation with layering debris in the distal CBD -GI following -ERCP on 12/27 showed choledocholithiasis. Patient underwent biliary sphincterotomy and balloon extraction -LFTs trending down this morning -Lipase trending down -symptoms improving -continue pain management, IV fluids and bowel rest -recheck labs in AM -Gen surgery evaluated patient with likely plans to perform cholecystectomy prior to discharge home -continue antibiotic coverage with zosyn -will keep npo after midnight in case cholecystectomy can be performed tomorrow    DVT prophylaxis: lovenox  Code Status: full code Family Communication: discussed with patient Disposition Plan: Status is: Inpatient  Remains inpatient appropriate because:Ongoing active pain requiring inpatient pain management, Ongoing diagnostic testing needed not appropriate for outpatient work up and IV treatments appropriate due to intensity of illness or inability to take PO   Dispo: The patient is from: Home              Anticipated d/c is to: Home              Anticipated d/c date is: 3 days              Patient currently is not medically stable to d/c.     Consultants:   GI  Gen Surg  Procedures:   ERCP 12/27  Antimicrobials:   Zosyn 12/25>    Subjective: Patient seen in room post ERCP. Denies any pain, nausea or vomiting  Objective: Vitals:   12/01/20 1346 12/01/20 1420 12/01/20 1528  12/01/20 1532  BP: 118/72 (!) 125/96 (!) 100/52 (!) 108/59  Pulse: 64  65   Resp: 20  18   Temp:   98.3 F (36.8 C)   TempSrc:   Oral   SpO2: 94%  98%   Weight:      Height:        Intake/Output Summary (Last 24 hours) at 12/01/2020 1848 Last data filed at 12/01/2020 1321 Gross per 24 hour  Intake 1950 ml  Output 1 ml  Net 1949 ml   Filed Weights   11/29/20 2339 12/01/20 1118  Weight: 90.7 kg 90.7 kg    Examination:  General exam: Alert, awake, oriented x 3 Respiratory system: Clear to auscultation. Respiratory effort normal. Cardiovascular system:RRR. No murmurs, rubs, gallops. Gastrointestinal system: Abdomen is nondistended, soft and nontender. No organomegaly or masses felt. Normal bowel sounds heard. Central nervous system: Alert and oriented. No focal neurological deficits. Extremities: No C/C/E, +pedal pulses Skin: No rashes, lesions or ulcers Psychiatry: Judgement and insight appear normal. Mood & affect appropriate.      Data Reviewed: I have personally reviewed following labs and imaging studies  CBC: Recent Labs  Lab 11/29/20 1828 11/30/20 0425 12/01/20 0128  WBC 15.4* 14.2* 11.8*  NEUTROABS 13.0*  --   --   HGB 14.7 12.9 11.7*  HCT 43.1 38.8 34.1*  MCV 89.6 89.8 88.8  PLT 393 341 361   Basic Metabolic Panel: Recent Labs  Lab 11/29/20 1828 11/30/20 0425 12/01/20 0128  NA 140 139 138  K  3.6 3.9 3.4*  CL 107 108 106  CO2 22 24 21*  GLUCOSE 122* 118* 78  BUN 6 7 7   CREATININE 0.80 0.86 0.82  CALCIUM 9.6 8.3* 8.3*   GFR: Estimated Creatinine Clearance: 105.6 mL/min (by C-G formula based on SCr of 0.82 mg/dL). Liver Function Tests: Recent Labs  Lab 11/29/20 1828 11/30/20 0425 12/01/20 0128  AST 286* 288* 126*  ALT 574* 544* 385*  ALKPHOS 156* 139* 175*  BILITOT 2.5* 3.0* 1.5*  PROT 7.3 5.8* 5.5*  ALBUMIN 4.1 3.1* 3.0*   Recent Labs  Lab 11/29/20 1828 12/01/20 0128  LIPASE >10,000* 965*   No results for input(s): AMMONIA in  the last 168 hours. Coagulation Profile: No results for input(s): INR, PROTIME in the last 168 hours. Cardiac Enzymes: No results for input(s): CKTOTAL, CKMB, CKMBINDEX, TROPONINI in the last 168 hours. BNP (last 3 results) No results for input(s): PROBNP in the last 8760 hours. HbA1C: No results for input(s): HGBA1C in the last 72 hours. CBG: No results for input(s): GLUCAP in the last 168 hours. Lipid Profile: No results for input(s): CHOL, HDL, LDLCALC, TRIG, CHOLHDL, LDLDIRECT in the last 72 hours. Thyroid Function Tests: No results for input(s): TSH, T4TOTAL, FREET4, T3FREE, THYROIDAB in the last 72 hours. Anemia Panel: No results for input(s): VITAMINB12, FOLATE, FERRITIN, TIBC, IRON, RETICCTPCT in the last 72 hours. Sepsis Labs: No results for input(s): PROCALCITON, LATICACIDVEN in the last 168 hours.  Recent Results (from the past 240 hour(s))  Resp Panel by RT-PCR (Flu A&B, Covid) Nasopharyngeal Swab     Status: None   Collection Time: 11/29/20  8:21 PM   Specimen: Nasopharyngeal Swab; Nasopharyngeal(NP) swabs in vial transport medium  Result Value Ref Range Status   SARS Coronavirus 2 by RT PCR NEGATIVE NEGATIVE Final    Comment: (NOTE) SARS-CoV-2 target nucleic acids are NOT DETECTED.  The SARS-CoV-2 RNA is generally detectable in upper respiratory specimens during the acute phase of infection. The lowest concentration of SARS-CoV-2 viral copies this assay can detect is 138 copies/mL. A negative result does not preclude SARS-Cov-2 infection and should not be used as the sole basis for treatment or other patient management decisions. A negative result may occur with  improper specimen collection/handling, submission of specimen other than nasopharyngeal swab, presence of viral mutation(s) within the areas targeted by this assay, and inadequate number of viral copies(<138 copies/mL). A negative result must be combined with clinical observations, patient history, and  epidemiological information. The expected result is Negative.  Fact Sheet for Patients:  12/01/20  Fact Sheet for Healthcare Providers:  BloggerCourse.com  This test is no t yet approved or cleared by the [place] FDA and  has been authorized for detection and/or diagnosis of SARS-CoV-2 by FDA under an Emergency Use Authorization (EUA). This EUA will remain  in effect (meaning this test can be used) for the duration of the COVID-19 declaration under Section 564(b)(1) of the Act, 21 U.S.C.section 360bbb-3(b)(1), unless the authorization is terminated  or revoked sooner.       Influenza A by PCR NEGATIVE NEGATIVE Final   Influenza B by PCR NEGATIVE NEGATIVE Final    Comment: (NOTE) The Xpert Xpress SARS-CoV-2/FLU/RSV plus assay is intended as an aid in the diagnosis of influenza from Nasopharyngeal swab specimens and should not be used as a sole basis for treatment. Nasal washings and aspirates are unacceptable for Xpert Xpress SARS-CoV-2/FLU/RSV testing.  Fact Sheet for Patients: [place]  Fact Sheet for Healthcare Providers: BloggerCourse.com  This test is not yet approved or cleared by the [place] and has been authorized for detection and/or diagnosis of SARS-CoV-2 by FDA under an Emergency Use Authorization (EUA). This EUA will remain in effect (meaning this test can be used) for the duration of the COVID-19 declaration under Section 564(b)(1) of the Act, 21 U.S.C. section 360bbb-3(b)(1), unless the authorization is terminated or revoked.  Performed at [facility], [address] [place], [place] [address]          Radiology Studies: DG ERCP BILIARY & PANCREATIC DUCTS  Result Date: 12/01/2020 CLINICAL DATA:  34 year old female with choledocholithiasis EXAM: ERCP TECHNIQUE: Multiple spot images obtained with the fluoroscopic  device and submitted for interpretation post-procedure. FLUOROSCOPY TIME:  Fluoroscopy Time:  1 minutes 49 seconds COMPARISON:  MR 11/29/2020 FINDINGS: Limited intraoperative fluoroscopic spot images during ERCP. Initial image demonstrates endoscope projecting over the upper abdomen. There is subsequently placement of a safety wire and retrograde infusion of contrast. Deployment of a retrieval balloon. IMPRESSION: Limited images during ERCP demonstrates deployment of a retrieval balloon for treatment of choledocholithiasis. Please refer to the dictated operative report for full details of intraoperative findings and procedure. Electronically Signed   By: Gilmer Mor D.O.   On: 12/01/2020 13:27   MR ABDOMEN MRCP W WO CONTAST  Result Date: 11/30/2020 CLINICAL DATA:  Cholelithiasis.  Pancreatitis. EXAM: MRI ABDOMEN WITHOUT AND WITH CONTRAST (INCLUDING MRCP) TECHNIQUE: Multiplanar multisequence MR imaging of the abdomen was performed both before and after the administration of intravenous contrast. Heavily T2-weighted images of the biliary and pancreatic ducts were obtained, and three-dimensional MRCP images were rendered by post processing. CONTRAST:  64mL GADAVIST GADOBUTROL 1 MMOL/ML IV SOLN COMPARISON:  Ultrasound exam earlier same day. FINDINGS: Lower chest: Unremarkable Hepatobiliary: 9 mm flash filling hypervascular focus in the subcapsular aspect of segment IV rapidly washes out to background hepatic parenchymal levels. This finding cannot be definitively characterized but is most likely benign and may be a small vascular malformation. Gallbladder is distended with gallbladder wall edema. Mild intrahepatic biliary duct dilatation evident. Common duct measures 10 mm in the hepatoduodenal ligament. Common bile duct is 10 mm diameter just proximal to the ampulla. There is layering subtle debris in the distal common bile duct seen only on axial T2 imaging (for example axial haste image 22 of series 3). This  debris could represent tiny layering stones or sludge. There is no increased signal on T1 imaging to suggest blood products. Imaging appearance is not typical for flow artifact. Pancreas: There is diffuse intrapancreatic and peripancreatic edema without main duct dilatation. Pancreatic parenchyma enhances throughout. No focal fluid collection. Spleen:  No splenomegaly. No focal mass lesion. Adrenals/Urinary Tract: No adrenal nodule or mass. Kidneys unremarkable. Stomach/Bowel: Stomach is unremarkable. No gastric wall thickening. No evidence of outlet obstruction. Duodenum is normally positioned as is the ligament of Treitz. No small bowel or colonic dilatation within the visualized abdomen. Vascular/Lymphatic: No abdominal aortic aneurysm. No abdominal aortic atherosclerotic calcification. Other:  Small volume intraperitoneal free fluid. Musculoskeletal: No focal suspicious marrow enhancement within the visualized bony anatomy. IMPRESSION: 1. Mild intrahepatic and extrahepatic biliary duct dilatation. There is layering debris in the distal common bile duct seen only on axial T2 imaging. This could represent tiny layering stones or sludge. 2. Diffuse intrapancreatic and peripancreatic edema without main duct dilatation. No evidence for pancreatic necrosis. No parapancreatic fluid collection. 3. 9 mm flash filling hypervascular focus in the subcapsular aspect of segment IV rapidly  washes out to background hepatic parenchymal levels. This finding cannot be definitively characterized but is most likely benign and may be a small vascular malformation. Electronically Signed   By: Kennith Center M.D.   On: 11/30/2020 04:17        Scheduled Meds: . fentaNYL      . [START ON 12/02/2020] pantoprazole  40 mg Oral Q0600  . sodium chloride flush  3 mL Intravenous Q12H   Continuous Infusions: . lactated ringers with kcl 125 mL/hr at 12/01/20 1647  . piperacillin-tazobactam (ZOSYN)  IV 3.375 g (12/01/20 1505)      LOS: 2 days    Time spent:    Erick Blinks, MD [facility]   If 7PM-7AM, please contact night-coverage [email or web address]  12/01/2020, 6:48 PM

## 2020-12-01 NOTE — Transfer of Care (Signed)
Immediate Anesthesia Transfer of Care Note  Patient: Amy Pittman  Procedure(s) Performed: ENDOSCOPIC RETROGRADE CHOLANGIOPANCREATOGRAPHY (ERCP) (N/A ) SPHINCTEROTOMY REMOVAL OF STONES BIOPSY  Patient Location: PACU  Anesthesia Type:General  Level of Consciousness: awake, alert , oriented and patient cooperative  Airway & Oxygen Therapy: Patient Spontanous Breathing and Patient connected to face mask oxygen  Post-op Assessment: Report given to RN and Post -op Vital signs reviewed and stable  Post vital signs: Reviewed and stable  Last Vitals:  Vitals Value Taken Time  BP 116/68 12/01/20 1315  Temp    Pulse 76 12/01/20 1320  Resp 22 12/01/20 1320  SpO2 97 % 12/01/20 1320  Vitals shown include unvalidated device data.  Last Pain:  Vitals:   12/01/20 1118  TempSrc: Tympanic  PainSc: 3          Complications: No complications documented.

## 2020-12-01 NOTE — Progress Notes (Addendum)
Subjective: Pain is somewhat less today, overall feels about the same though.  Mostly points to RUQ for abdominal pain.  No nausea.  ROS: See above, otherwise other systems negative  Objective: Vital signs in last 24 hours: Temp:  [98.9 F (37.2 C)-99 F (37.2 C)] 99 F (37.2 C) (12/27 0500) Pulse Rate:  [66-86] 86 (12/27 0500) Resp:  [17-18] 17 (12/27 0500) BP: (111-118)/(70-79) 114/70 (12/27 0500) SpO2:  [96 %-98 %] 97 % (12/27 0500) Last BM Date: 11/27/20  Intake/Output from previous day: 12/26 0701 - 12/27 0700 In: 2519.8 [I.V.:2419.8; IV Piggyback:100] Out: -  Intake/Output this shift: No intake/output data recorded.  PE: Heart: regular Lungs: CTAB Abd: soft, tender in epigastrium and RUQ, +BS, ND  Lab Results:  Recent Labs    11/30/20 0425 12/01/20 0128  WBC 14.2* 11.8*  HGB 12.9 11.7*  HCT 38.8 34.1*  PLT 341 304   BMET Recent Labs    11/30/20 0425 12/01/20 0128  NA 139 138  K 3.9 3.4*  CL 108 106  CO2 24 21*  GLUCOSE 118* 78  BUN 7 7  CREATININE 0.86 0.82  CALCIUM 8.3* 8.3*   PT/INR No results for input(s): LABPROT, INR in the last 72 hours. CMP     Component Value Date/Time   NA 138 12/01/2020 0128   K 3.4 (L) 12/01/2020 0128   CL 106 12/01/2020 0128   CO2 21 (L) 12/01/2020 0128   GLUCOSE 78 12/01/2020 0128   BUN 7 12/01/2020 0128   CREATININE 0.82 12/01/2020 0128   CALCIUM 8.3 (L) 12/01/2020 0128   PROT 5.5 (L) 12/01/2020 0128   ALBUMIN 3.0 (L) 12/01/2020 0128   AST 126 (H) 12/01/2020 0128   ALT 385 (H) 12/01/2020 0128   ALKPHOS 175 (H) 12/01/2020 0128   BILITOT 1.5 (H) 12/01/2020 0128   GFRNONAA >60 12/01/2020 0128   GFRAA 159 07/20/2007 1451   Lipase     Component Value Date/Time   LIPASE 965 (H) 12/01/2020 0128       Studies/Results: MR ABDOMEN MRCP W WO CONTAST  Result Date: 11/30/2020 CLINICAL DATA:  Cholelithiasis.  Pancreatitis. EXAM: MRI ABDOMEN WITHOUT AND WITH CONTRAST (INCLUDING MRCP) TECHNIQUE:  Multiplanar multisequence MR imaging of the abdomen was performed both before and after the administration of intravenous contrast. Heavily T2-weighted images of the biliary and pancreatic ducts were obtained, and three-dimensional MRCP images were rendered by post processing. CONTRAST:  20mL GADAVIST GADOBUTROL 1 MMOL/ML IV SOLN COMPARISON:  Ultrasound exam earlier same day. FINDINGS: Lower chest: Unremarkable Hepatobiliary: 9 mm flash filling hypervascular focus in the subcapsular aspect of segment IV rapidly washes out to background hepatic parenchymal levels. This finding cannot be definitively characterized but is most likely benign and may be a small vascular malformation. Gallbladder is distended with gallbladder wall edema. Mild intrahepatic biliary duct dilatation evident. Common duct measures 10 mm in the hepatoduodenal ligament. Common bile duct is 10 mm diameter just proximal to the ampulla. There is layering subtle debris in the distal common bile duct seen only on axial T2 imaging (for example axial haste image 22 of series 3). This debris could represent tiny layering stones or sludge. There is no increased signal on T1 imaging to suggest blood products. Imaging appearance is not typical for flow artifact. Pancreas: There is diffuse intrapancreatic and peripancreatic edema without main duct dilatation. Pancreatic parenchyma enhances throughout. No focal fluid collection. Spleen:  No splenomegaly. No focal mass lesion. Adrenals/Urinary Tract: No adrenal  nodule or mass. Kidneys unremarkable. Stomach/Bowel: Stomach is unremarkable. No gastric wall thickening. No evidence of outlet obstruction. Duodenum is normally positioned as is the ligament of Treitz. No small bowel or colonic dilatation within the visualized abdomen. Vascular/Lymphatic: No abdominal aortic aneurysm. No abdominal aortic atherosclerotic calcification. Other:  Small volume intraperitoneal free fluid. Musculoskeletal: No focal suspicious  marrow enhancement within the visualized bony anatomy. IMPRESSION: 1. Mild intrahepatic and extrahepatic biliary duct dilatation. There is layering debris in the distal common bile duct seen only on axial T2 imaging. This could represent tiny layering stones or sludge. 2. Diffuse intrapancreatic and peripancreatic edema without main duct dilatation. No evidence for pancreatic necrosis. No parapancreatic fluid collection. 3. 9 mm flash filling hypervascular focus in the subcapsular aspect of segment IV rapidly washes out to background hepatic parenchymal levels. This finding cannot be definitively characterized but is most likely benign and may be a small vascular malformation. Electronically Signed   By: Eric  Mansell M.D.   On: 11/30/2020 04:17   US Abdomen Limited RUQ (LIVER/GB)  Result Date: 11/29/2020 CLINICAL DATA:  34-year-old female with right upper quadrant abdominal pain. EXAM: ULTRASOUND ABDOMEN LIMITED RIGHT UPPER QUADRANT COMPARISON:  None. FINDINGS: Gallbladder: There multiple stones within the gallbladder. No gallbladder wall thickening or pericholecystic fluid. Negative sonographic Murphy's sign. Common bile duct: Diameter: 3 mm Liver: There is diffuse increased liver echogenicity most commonly seen in the setting of fatty infiltration. Superimposed inflammation or fibrosis is not excluded. Clinical correlation is recommended. Portal vein is patent on color Doppler imaging with normal direction of blood flow towards the liver. Other: None. IMPRESSION: 1. Cholelithiasis without sonographic evidence of acute cholecystitis. 2. Fatty liver. Electronically Signed   By: Arash  Radparvar M.D.   On: 11/29/2020 17:27    Anti-infectives: Anti-infectives (From admission, onward)   Start     Dose/Rate Route Frequency Ordered Stop   11/30/20 1400  piperacillin-tazobactam (ZOSYN) IVPB 3.375 g        3.375 g 12.5 mL/hr over 240 Minutes Intravenous Every 8 hours 11/30/20 1107     11/29/20 2100   piperacillin-tazobactam (ZOSYN) IVPB 3.375 g        3.375 g 100 mL/hr over 30 Minutes Intravenous  Once 11/29/20 2049 11/29/20 2144       Assessment/Plan Choledocholithasis - ERCP scheduled for today.  LFTs down.  Still tender in RUQ.    Gallstone pancreatitis -lipase down from >10000 to 900s today. -still tender. -will allow pancreatitis to continue to cool off and hopefully not return with ERCP if still planned for today.  Once this occurs we will plan lap chole.  It could be as soon as tomorrow, but more likely to be Wednesday or Thursday. -will follow  FEN - NPO, IVFs VTE - none currently ID - [email or web address]   LOS: 2 days    Rebakah Cokley E Kensie Susman , PA-C [facility] 12/01/2020, 9:15 AM Please see Amion for pager number during day hours 7:00am-4:30pm or 7:00am -11:30am on weekends  

## 2020-12-01 NOTE — Anesthesia Preprocedure Evaluation (Addendum)
Anesthesia Evaluation  Patient identified by MRN, date of birth, ID band Patient awake    Reviewed: Allergy & Precautions, NPO status , Patient's Chart, lab work & pertinent test results  History of Anesthesia Complications Negative for: history of anesthetic complications  Airway Mallampati: II  TM Distance: >3 FB Neck ROM: Full    Dental  (+) Dental Advisory Given, Teeth Intact   Pulmonary neg pulmonary ROS,  11/29/2020 SARS coronavirus NEG   breath sounds clear to auscultation       Cardiovascular negative cardio ROS   Rhythm:Regular Rate:Normal     Neuro/Psych Seizures -, Well Controlled,  Chronic back pain    GI/Hepatic negative GI ROS, Elevated LFTs: CBD stones   Endo/Other  obese  Renal/GU negative Renal ROS     Musculoskeletal   Abdominal (+) + obese,   Peds  Hematology negative hematology ROS (+)   Anesthesia Other Findings   Reproductive/Obstetrics                            Anesthesia Physical Anesthesia Plan  ASA: II  Anesthesia Plan: General   Post-op Pain Management:    Induction: Intravenous  PONV Risk Score and Plan: 3 and Ondansetron, Dexamethasone and Scopolamine patch - Pre-op  Airway Management Planned: Oral ETT  Additional Equipment: None  Intra-op Plan:   Post-operative Plan: Extubation in OR  Informed Consent: I have reviewed the patients History and Physical, chart, labs and discussed the procedure including the risks, benefits and alternatives for the proposed anesthesia with the patient or authorized representative who has indicated his/her understanding and acceptance.     Dental advisory given  Plan Discussed with: CRNA and Surgeon  Anesthesia Plan Comments:        Anesthesia Quick Evaluation

## 2020-12-01 NOTE — Interval H&P Note (Signed)
History and Physical Interval Note:  12/01/2020 10:50 AM  Amy Pittman  has presented today for surgery, with the diagnosis of CBD stones, gallstone pancreatitis.  The various methods of treatment have been discussed with the patient and family. After consideration of risks, benefits and other options for treatment, the patient has consented to  Procedure(s): ENDOSCOPIC RETROGRADE CHOLANGIOPANCREATOGRAPHY (ERCP) (N/A) as a surgical intervention.  The patient's history has been reviewed, patient examined, no change in status, stable for surgery.  I have reviewed the patient's chart and labs.  Questions were answered to the patient's satisfaction.     Lynann Bologna

## 2020-12-01 NOTE — Anesthesia Postprocedure Evaluation (Signed)
Anesthesia Post Note  Patient: Ebany Bowermaster Brunke  Procedure(s) Performed: ENDOSCOPIC RETROGRADE CHOLANGIOPANCREATOGRAPHY (ERCP) (N/A ) SPHINCTEROTOMY REMOVAL OF STONES BIOPSY     Patient location during evaluation: PACU Anesthesia Type: General Level of consciousness: awake and alert, patient cooperative and oriented Pain management: pain level controlled Vital Signs Assessment: post-procedure vital signs reviewed and stable Respiratory status: spontaneous breathing, nonlabored ventilation and respiratory function stable Cardiovascular status: blood pressure returned to baseline and stable Postop Assessment: no apparent nausea or vomiting Anesthetic complications: no   No complications documented.  Last Vitals:  Vitals:   12/01/20 1528 12/01/20 1532  BP: (!) 100/52 (!) 108/59  Pulse: 65   Resp: 18   Temp: 36.8 C   SpO2: 98%     Last Pain:  Vitals:   12/01/20 1528  TempSrc: Oral  PainSc:                  [place]rollton Cohick,E. Michaell Grider

## 2020-12-01 NOTE — Anesthesia Procedure Notes (Signed)
Procedure Name: Intubation Performed by: Cleda Daub, CRNA Pre-anesthesia Checklist: Patient identified, Emergency Drugs available, Suction available and Patient being monitored Patient Re-evaluated:Patient Re-evaluated prior to induction Oxygen Delivery Method: Circle system utilized Preoxygenation: Pre-oxygenation with 100% oxygen Induction Type: IV induction Ventilation: Mask ventilation without difficulty Laryngoscope Size: Mac and 3 Grade View: Grade I Tube type: Oral Tube size: 7.0 mm Number of attempts: 1 Airway Equipment and Method: Stylet and Oral airway Placement Confirmation: ETT inserted through vocal cords under direct vision,  positive ETCO2 and breath sounds checked- equal and bilateral Tube secured with: Tape Dental Injury: Teeth and Oropharynx as per pre-operative assessment

## 2020-12-01 NOTE — H&P (View-Only) (Signed)
Subjective: Pain is somewhat less today, overall feels about the same though.  Mostly points to RUQ for abdominal pain.  No nausea.  ROS: See above, otherwise other systems negative  Objective: Vital signs in last 24 hours: Temp:  [98.9 F (37.2 C)-99 F (37.2 C)] 99 F (37.2 C) (12/27 0500) Pulse Rate:  [66-86] 86 (12/27 0500) Resp:  [17-18] 17 (12/27 0500) BP: (111-118)/(70-79) 114/70 (12/27 0500) SpO2:  [96 %-98 %] 97 % (12/27 0500) Last BM Date: 11/27/20  Intake/Output from previous day: 12/26 0701 - 12/27 0700 In: 2519.8 [I.V.:2419.8; IV Piggyback:100] Out: -  Intake/Output this shift: No intake/output data recorded.  PE: Heart: regular Lungs: CTAB Abd: soft, tender in epigastrium and RUQ, +BS, ND  Lab Results:  Recent Labs    11/30/20 0425 12/01/20 0128  WBC 14.2* 11.8*  HGB 12.9 11.7*  HCT 38.8 34.1*  PLT 341 304   BMET Recent Labs    11/30/20 0425 12/01/20 0128  NA 139 138  K 3.9 3.4*  CL 108 106  CO2 24 21*  GLUCOSE 118* 78  BUN 7 7  CREATININE 0.86 0.82  CALCIUM 8.3* 8.3*   PT/INR No results for input(s): LABPROT, INR in the last 72 hours. CMP     Component Value Date/Time   NA 138 12/01/2020 0128   K 3.4 (L) 12/01/2020 0128   CL 106 12/01/2020 0128   CO2 21 (L) 12/01/2020 0128   GLUCOSE 78 12/01/2020 0128   BUN 7 12/01/2020 0128   CREATININE 0.82 12/01/2020 0128   CALCIUM 8.3 (L) 12/01/2020 0128   PROT 5.5 (L) 12/01/2020 0128   ALBUMIN 3.0 (L) 12/01/2020 0128   AST 126 (H) 12/01/2020 0128   ALT 385 (H) 12/01/2020 0128   ALKPHOS 175 (H) 12/01/2020 0128   BILITOT 1.5 (H) 12/01/2020 0128   GFRNONAA >60 12/01/2020 0128   GFRAA 159 07/20/2007 1451   Lipase     Component Value Date/Time   LIPASE 965 (H) 12/01/2020 0128       Studies/Results: MR ABDOMEN MRCP W WO CONTAST  Result Date: 11/30/2020 CLINICAL DATA:  Cholelithiasis.  Pancreatitis. EXAM: MRI ABDOMEN WITHOUT AND WITH CONTRAST (INCLUDING MRCP) TECHNIQUE:  Multiplanar multisequence MR imaging of the abdomen was performed both before and after the administration of intravenous contrast. Heavily T2-weighted images of the biliary and pancreatic ducts were obtained, and three-dimensional MRCP images were rendered by post processing. CONTRAST:  20mL GADAVIST GADOBUTROL 1 MMOL/ML IV SOLN COMPARISON:  Ultrasound exam earlier same day. FINDINGS: Lower chest: Unremarkable Hepatobiliary: 9 mm flash filling hypervascular focus in the subcapsular aspect of segment IV rapidly washes out to background hepatic parenchymal levels. This finding cannot be definitively characterized but is most likely benign and may be a small vascular malformation. Gallbladder is distended with gallbladder wall edema. Mild intrahepatic biliary duct dilatation evident. Common duct measures 10 mm in the hepatoduodenal ligament. Common bile duct is 10 mm diameter just proximal to the ampulla. There is layering subtle debris in the distal common bile duct seen only on axial T2 imaging (for example axial haste image 22 of series 3). This debris could represent tiny layering stones or sludge. There is no increased signal on T1 imaging to suggest blood products. Imaging appearance is not typical for flow artifact. Pancreas: There is diffuse intrapancreatic and peripancreatic edema without main duct dilatation. Pancreatic parenchyma enhances throughout. No focal fluid collection. Spleen:  No splenomegaly. No focal mass lesion. Adrenals/Urinary Tract: No adrenal  nodule or mass. Kidneys unremarkable. Stomach/Bowel: Stomach is unremarkable. No gastric wall thickening. No evidence of outlet obstruction. Duodenum is normally positioned as is the ligament of Treitz. No small bowel or colonic dilatation within the visualized abdomen. Vascular/Lymphatic: No abdominal aortic aneurysm. No abdominal aortic atherosclerotic calcification. Other:  Small volume intraperitoneal free fluid. Musculoskeletal: No focal suspicious  marrow enhancement within the visualized bony anatomy. IMPRESSION: 1. Mild intrahepatic and extrahepatic biliary duct dilatation. There is layering debris in the distal common bile duct seen only on axial T2 imaging. This could represent tiny layering stones or sludge. 2. Diffuse intrapancreatic and peripancreatic edema without main duct dilatation. No evidence for pancreatic necrosis. No parapancreatic fluid collection. 3. 9 mm flash filling hypervascular focus in the subcapsular aspect of segment IV rapidly washes out to background hepatic parenchymal levels. This finding cannot be definitively characterized but is most likely benign and may be a small vascular malformation. Electronically Signed   By: Misty Stanley M.D.   On: 11/30/2020 04:17   US Abdomen Limited RUQ (LIVER/GB)  Result Date: 11/29/2020 CLINICAL DATA:  34 year old female with right upper quadrant abdominal pain. EXAM: ULTRASOUND ABDOMEN LIMITED RIGHT UPPER QUADRANT COMPARISON:  None. FINDINGS: Gallbladder: There multiple stones within the gallbladder. No gallbladder wall thickening or pericholecystic fluid. Negative sonographic Murphy's sign. Common bile duct: Diameter: 3 mm Liver: There is diffuse increased liver echogenicity most commonly seen in the setting of fatty infiltration. Superimposed inflammation or fibrosis is not excluded. Clinical correlation is recommended. Portal vein is patent on color Doppler imaging with normal direction of blood flow towards the liver. Other: None. IMPRESSION: 1. Cholelithiasis without sonographic evidence of acute cholecystitis. 2. Fatty liver. Electronically Signed   By: Anner Crete M.D.   On: 11/29/2020 17:27    Anti-infectives: Anti-infectives (From admission, onward)   Start     Dose/Rate Route Frequency Ordered Stop   11/30/20 1400  piperacillin-tazobactam (ZOSYN) IVPB 3.375 g        3.375 g 12.5 mL/hr over 240 Minutes Intravenous Every 8 hours 11/30/20 1107     11/29/20 2100   piperacillin-tazobactam (ZOSYN) IVPB 3.375 g        3.375 g 100 mL/hr over 30 Minutes Intravenous  Once 11/29/20 2049 11/29/20 2144       Assessment/Plan Choledocholithasis - ERCP scheduled for today.  LFTs down.  Still tender in RUQ.    Gallstone pancreatitis -lipase down from >10000 to 900s today. -still tender. -will allow pancreatitis to continue to cool off and hopefully not return with ERCP if still planned for today.  Once this occurs we will plan lap chole.  It could be as soon as tomorrow, but more likely to be Wednesday or Thursday. -will follow  FEN - NPO, IVFs VTE - none currently ID - [email or web address]   LOS: 2 days    Henreitta Cea , [facility] 12/01/2020, 9:15 AM Please see Amion for pager number during day hours 7:00am-4:30pm or 7:00am -11:30am on weekends

## 2020-12-01 NOTE — Op Note (Signed)
[facility] Patient Name: Amy Pittman Procedure Date : 12/01/2020 MRN: [id number] Attending MD: Jackquline Denmark , MD Date of Birth: 04-18-1986 CSN: [id number] Age: 34 Admit Type: Inpatient Procedure:                ERCP Indications:              Bile duct stone(s) on MRCP. Biliary pancreatitis on                            admission. Providers:                Jackquline Denmark, MD, Particia Nearing, RN, Elspeth Cho                            Tech., Technician, Clearnce Sorrel, CRNA Referring MD:              Medicines:                General Anesthesia, patient already on IV Zosyn. Complications:            No immediate complications. Estimated Blood Loss:     Estimated blood loss: none. Procedure:                Pre-Anesthesia Assessment:                           - Prior to the procedure, a History and Physical                            was performed, and patient medications and                            allergies were reviewed. The patient's tolerance of                            previous anesthesia was also reviewed. The risks                            and benefits of the procedure and the sedation                            options and risks were discussed with the patient.                            All questions were answered, and informed consent                            was obtained. Prior Anticoagulants: The patient has                            taken no previous anticoagulant or antiplatelet                            agents. ASA Grade Assessment: II - A patient with  mild systemic disease. After reviewing the risks                            and benefits, the patient was deemed in                            satisfactory condition to undergo the procedure.                           After obtaining informed consent, the scope was                            passed under direct vision. Throughout the                             procedure, the patient's blood pressure, pulse, and                            oxygen saturations were monitored continuously. The                            TJF- Q180V ([id number]) Olympus duodenoscope was                            introduced through the mouth, and used to inject                            contrast into and used to inject contrast into the                            bile duct. The ERCP was accomplished without                            difficulty. The patient tolerated the procedure                            well. Scope In: Scope Out: Findings:      The scout film was normal. The esophagus was successfully intubated       under direct vision. The scope was advanced to a normal but edematous       major papilla in the descending duodenum without detailed examination of       the pharynx, larynx and associated structures, and upper GI tract. Mild       antral gastritis. Multiple superficial ulcers were noted in the       duodenum. The major papilla was edematous with a small superficial ulcer.      The bile duct was deeply cannulated with the short-nosed traction       sphincterotome. Contrast was injected. I personally interpreted the bile       duct images. There was brisk flow of contrast through the ducts. Image       quality was adequate. Contrast extended to the entire biliary tree. A       small 6 mm filling defect was noted in the distal common bile duct. CBD  was dilated to 10 mm. The right and left hepatic ducts were mildly       dilated. Cystic duct did fill. There were multiple filling defects in       the gallbladder consistent with cholelithiasis. An 8 mm biliary       sphincterotomy was made with a traction (standard) sphincterotome using       ERBE's Endocut at 12 o'clock position. There was no post-sphincterotomy       bleeding. The biliary tree was swept with a 10 mm balloon starting at       the bifurcation. A single small stone and sludge was swept  from the       duct. All stones were removed. Postocclusion cholangiogram was obtained       which did not show any residual stones.      Pancreaticogram was intentionally not obtained. However, the wire did       find pancreatic duct initially.      Gastric mucosa were biopsied with a cold forceps for histology to R/O HP.      Indomethacin suppositories were given. 1 lit LR bolus was also given to       prevent post ERCP pancreatitis. Impression:               - Choledocholithiasis was found. Complete removal                            was accomplished by biliary sphincterotomy and                            balloon extraction.                           - Duodenal ulcers with duodenal edema (likely due                            to recent pancreatitis)                           - Gastric biopsy was performed to r/o HP. Recommendation:           - Return patient to hospital ward for ongoing care.                           - Clear liquid diet today.                           - Continue IV Protonix                           - Watch for pancreatitis, bleeding, perforation,                            and cholangitis.                           - Can proceed with laparoscopic cholecystectomy as                            planned.                           -  The findings and recommendations were discussed                            with the patient's family. Procedure Code(s):        --- Professional ---                           [phone], Endoscopic retrograde                            cholangiopancreatography (ERCP); with removal of                            calculi/debris from biliary/pancreatic duct(s)                           43262, Endoscopic retrograde                            cholangiopancreatography (ERCP); with                            sphincterotomy/papillotomy                           43261, Endoscopic retrograde                            cholangiopancreatography (ERCP);  with biopsy,                            single or multiple                           74328, Endoscopic catheterization of the biliary                            ductal system, radiological supervision and                            interpretation Diagnosis Code(s):        --- Professional ---                           K80.50, Calculus of bile duct without cholangitis                            or cholecystitis without obstruction                           K83.8, Other specified diseases of biliary tract CPT copyright 2019 [employer]. All rights reserved. The codes documented in this report are preliminary and upon coder review may  be revised to meet current compliance requirements. Jackquline Denmark, MD 12/01/2020 1:11:42 PM This report has been signed electronically. Number of Addenda: 0

## 2020-12-02 ENCOUNTER — Encounter (HOSPITAL_COMMUNITY): Payer: Self-pay | Admitting: Internal Medicine

## 2020-12-02 ENCOUNTER — Encounter (HOSPITAL_COMMUNITY)
Admission: EM | Disposition: A | Discharge status: Home / Self Care | Payer: Self-pay | Source: Home / Self Care | Attending: Internal Medicine

## 2020-12-02 ENCOUNTER — Inpatient Hospital Stay (HOSPITAL_COMMUNITY): Payer: 59 | Admitting: Certified Registered Nurse Anesthetist

## 2020-12-02 ENCOUNTER — Other Ambulatory Visit (HOSPITAL_COMMUNITY): Payer: Self-pay | Admitting: General Surgery

## 2020-12-02 ENCOUNTER — Inpatient Hospital Stay (HOSPITAL_COMMUNITY): Payer: 59

## 2020-12-02 HISTORY — PX: CHOLECYSTECTOMY: SHX55

## 2020-12-02 LAB — LIPASE, BLOOD: Lipase: 101 U/L — ABNORMAL HIGH (ref 11–51)

## 2020-12-02 LAB — CBC
HCT: 33.9 % — ABNORMAL LOW (ref 36.0–46.0)
Hemoglobin: 11.6 g/dL — ABNORMAL LOW (ref 12.0–15.0)
MCH: 30.8 pg (ref 26.0–34.0)
MCHC: 34.2 g/dL (ref 30.0–36.0)
MCV: 89.9 fL (ref 80.0–100.0)
Platelets: 291 10*3/uL (ref 150–400)
RBC: 3.77 MIL/uL — ABNORMAL LOW (ref 3.87–5.11)
RDW: 13.3 % (ref 11.5–15.5)
WBC: 13 10*3/uL — ABNORMAL HIGH (ref 4.0–10.5)
nRBC: 0 % (ref 0.0–0.2)

## 2020-12-02 LAB — COMPREHENSIVE METABOLIC PANEL
ALT: 251 U/L — ABNORMAL HIGH (ref 0–44)
AST: 43 U/L — ABNORMAL HIGH (ref 15–41)
Albumin: 2.9 g/dL — ABNORMAL LOW (ref 3.5–5.0)
Alkaline Phosphatase: 148 U/L — ABNORMAL HIGH (ref 38–126)
Anion gap: 9 (ref 5–15)
BUN: 6 mg/dL (ref 6–20)
CO2: 24 mmol/L (ref 22–32)
Calcium: 8.6 mg/dL — ABNORMAL LOW (ref 8.9–10.3)
Chloride: 107 mmol/L (ref 98–111)
Creatinine, Ser: 0.76 mg/dL (ref 0.44–1.00)
GFR, Estimated: >60 mL/min (ref >60)
Glucose, Bld: 126 mg/dL — ABNORMAL HIGH (ref 70–99)
Potassium: 3.8 mmol/L (ref 3.5–5.1)
Sodium: 140 mmol/L (ref 135–145)
Total Bilirubin: 0.6 mg/dL (ref 0.3–1.2)
Total Protein: 5.7 g/dL — ABNORMAL LOW (ref 6.5–8.1)

## 2020-12-02 LAB — POCT PREGNANCY, URINE: Preg Test, Ur: NEGATIVE

## 2020-12-02 LAB — SURGICAL PATHOLOGY

## 2020-12-02 LAB — SURGICAL PCR SCREEN
MRSA, PCR: NEGATIVE
Staphylococcus aureus: NEGATIVE

## 2020-12-02 LAB — MAGNESIUM: Magnesium: 1.9 mg/dL (ref 1.7–2.4)

## 2020-12-02 SURGERY — LAPAROSCOPIC CHOLECYSTECTOMY
Anesthesia: General

## 2020-12-02 MED ORDER — PROPOFOL 10 MG/ML IV BOLUS
INTRAVENOUS | Status: DC | PRN
  Administered 2020-12-02: 200 mg via INTRAVENOUS

## 2020-12-02 MED ORDER — HYDROMORPHONE HCL 1 MG/ML IJ SOLN
0.5000 mg | INTRAMUSCULAR | Status: DC | PRN
  Administered 2020-12-02: 1 mg via INTRAVENOUS
  Filled 2020-12-02: qty 1

## 2020-12-02 MED ORDER — OXYCODONE HCL 5 MG PO TABS: 5.0000 mg | ORAL_TABLET | ORAL | Status: DC | PRN

## 2020-12-02 MED ORDER — BUPIVACAINE HCL (PF) 0.25 % IJ SOLN
INTRAMUSCULAR | Status: DC | PRN
  Administered 2020-12-02: 7 mL

## 2020-12-02 MED ORDER — OXYCODONE HCL 5 MG PO TABS: 5.0000 mg | ORAL_TABLET | ORAL | 0 refills | Status: DC | PRN

## 2020-12-02 MED ORDER — CHLORHEXIDINE GLUCONATE 0.12 % MT SOLN
OROMUCOSAL | Status: AC
  Administered 2020-12-02: 15 mL via OROMUCOSAL
  Filled 2020-12-02: qty 15

## 2020-12-02 MED ORDER — MIDAZOLAM HCL 2 MG/2ML IJ SOLN
0.5000 mg | Freq: Once | INTRAMUSCULAR | Status: DC | PRN
Start: 2020-12-02 — End: 2020-12-02

## 2020-12-02 MED ORDER — MIDAZOLAM HCL 5 MG/5ML IJ SOLN
INTRAMUSCULAR | Status: DC | PRN
  Administered 2020-12-02: 2 mg via INTRAVENOUS

## 2020-12-02 MED ORDER — ROCURONIUM BROMIDE 10 MG/ML (PF) SYRINGE
PREFILLED_SYRINGE | INTRAVENOUS | Status: AC
  Filled 2020-12-02: qty 10

## 2020-12-02 MED ORDER — ACETAMINOPHEN 500 MG PO TABS: 1000.0000 mg | ORAL_TABLET | Freq: Four times a day (QID) | ORAL | 0 refills | Status: DC | PRN

## 2020-12-02 MED ORDER — POTASSIUM CHLORIDE 2 MEQ/ML IV SOLN
INTRAVENOUS | Status: DC
  Filled 2020-12-02: qty 1000

## 2020-12-02 MED ORDER — CELECOXIB 200 MG PO CAPS
200.0000 mg | ORAL_CAPSULE | ORAL | Status: AC
  Administered 2020-12-02: 200 mg via ORAL
  Filled 2020-12-02: qty 1

## 2020-12-02 MED ORDER — ACETAMINOPHEN 500 MG PO TABS: 1000.0000 mg | ORAL_TABLET | Freq: Four times a day (QID) | ORAL | Status: DC | PRN

## 2020-12-02 MED ORDER — ONDANSETRON HCL 4 MG/2ML IJ SOLN
INTRAMUSCULAR | Status: DC | PRN
  Administered 2020-12-02: 4 mg via INTRAVENOUS

## 2020-12-02 MED ORDER — PHENYLEPHRINE 40 MCG/ML (10ML) SYRINGE FOR IV PUSH (FOR BLOOD PRESSURE SUPPORT)
PREFILLED_SYRINGE | INTRAVENOUS | Status: DC | PRN
  Administered 2020-12-02: 40 ug via INTRAVENOUS
  Administered 2020-12-02: 80 ug via INTRAVENOUS

## 2020-12-02 MED ORDER — CHLORHEXIDINE GLUCONATE 0.12 % MT SOLN: 15.0000 mL | Freq: Once | OROMUCOSAL | Status: AC

## 2020-12-02 MED ORDER — EPHEDRINE SULFATE-NACL 50-0.9 MG/10ML-% IV SOSY
PREFILLED_SYRINGE | INTRAVENOUS | Status: DC | PRN
  Administered 2020-12-02: 5 mg via INTRAVENOUS

## 2020-12-02 MED ORDER — FENTANYL CITRATE (PF) 100 MCG/2ML IJ SOLN
25.0000 ug | INTRAMUSCULAR | Status: DC | PRN
Start: 2020-12-02 — End: 2020-12-02

## 2020-12-02 MED ORDER — SODIUM CHLORIDE 0.9 % IV SOLN
INTRAVENOUS | Status: DC | PRN
  Administered 2020-12-02: 38 mL

## 2020-12-02 MED ORDER — DEXAMETHASONE SODIUM PHOSPHATE 10 MG/ML IJ SOLN
INTRAMUSCULAR | Status: DC | PRN
  Administered 2020-12-02: 4 mg via INTRAVENOUS

## 2020-12-02 MED ORDER — SODIUM CHLORIDE 0.9 % IR SOLN
Status: DC | PRN
  Administered 2020-12-02: 800 mL

## 2020-12-02 MED ORDER — BUPIVACAINE HCL (PF) 0.25 % IJ SOLN
INTRAMUSCULAR | Status: AC
  Filled 2020-12-02: qty 30

## 2020-12-02 MED ORDER — LIDOCAINE 2% (20 MG/ML) 5 ML SYRINGE
INTRAMUSCULAR | Status: AC
  Filled 2020-12-02: qty 5

## 2020-12-02 MED ORDER — PROPOFOL 10 MG/ML IV BOLUS
INTRAVENOUS | Status: AC
  Filled 2020-12-02: qty 20

## 2020-12-02 MED ORDER — LACTATED RINGERS IV SOLN: INTRAVENOUS | Status: DC

## 2020-12-02 MED ORDER — LIDOCAINE 2% (20 MG/ML) 5 ML SYRINGE
INTRAMUSCULAR | Status: DC | PRN
  Administered 2020-12-02: 100 mg via INTRAVENOUS

## 2020-12-02 MED ORDER — ACETAMINOPHEN 500 MG PO TABS
1000.0000 mg | ORAL_TABLET | ORAL | Status: AC
  Administered 2020-12-02: 1000 mg via ORAL
  Filled 2020-12-02: qty 2

## 2020-12-02 MED ORDER — DEXAMETHASONE SODIUM PHOSPHATE 10 MG/ML IJ SOLN
INTRAMUSCULAR | Status: AC
  Filled 2020-12-02: qty 1

## 2020-12-02 MED ORDER — MIDAZOLAM HCL 2 MG/2ML IJ SOLN
INTRAMUSCULAR | Status: AC
  Filled 2020-12-02: qty 2

## 2020-12-02 MED ORDER — ROCURONIUM BROMIDE 10 MG/ML (PF) SYRINGE
PREFILLED_SYRINGE | INTRAVENOUS | Status: DC | PRN
  Administered 2020-12-02: 20 mg via INTRAVENOUS
  Administered 2020-12-02: 50 mg via INTRAVENOUS

## 2020-12-02 MED ORDER — FENTANYL CITRATE (PF) 250 MCG/5ML IJ SOLN
INTRAMUSCULAR | Status: AC
  Filled 2020-12-02: qty 5

## 2020-12-02 MED ORDER — SUGAMMADEX SODIUM 200 MG/2ML IV SOLN
INTRAVENOUS | Status: DC | PRN
  Administered 2020-12-02: 200 mg via INTRAVENOUS

## 2020-12-02 MED ORDER — ONDANSETRON HCL 4 MG/2ML IJ SOLN
INTRAMUSCULAR | Status: AC
  Filled 2020-12-02: qty 2

## 2020-12-02 MED ORDER — 0.9 % SODIUM CHLORIDE (POUR BTL) OPTIME
TOPICAL | Status: DC | PRN
  Administered 2020-12-02: 1000 mL

## 2020-12-02 MED ORDER — EPHEDRINE 5 MG/ML INJ
INTRAVENOUS | Status: AC
  Filled 2020-12-02: qty 10

## 2020-12-02 MED ORDER — PROMETHAZINE HCL 25 MG/ML IJ SOLN: 6.2500 mg | INTRAMUSCULAR | Status: DC | PRN

## 2020-12-02 MED ORDER — FENTANYL CITRATE (PF) 250 MCG/5ML IJ SOLN
INTRAMUSCULAR | Status: DC | PRN
  Administered 2020-12-02: 50 ug via INTRAVENOUS
  Administered 2020-12-02: 100 ug via INTRAVENOUS
  Administered 2020-12-02 (×2): 50 ug via INTRAVENOUS

## 2020-12-02 MED ORDER — MEPERIDINE HCL 25 MG/ML IJ SOLN: 6.2500 mg | INTRAMUSCULAR | Status: DC | PRN

## 2020-12-02 MED ORDER — PHENYLEPHRINE 40 MCG/ML (10ML) SYRINGE FOR IV PUSH (FOR BLOOD PRESSURE SUPPORT)
PREFILLED_SYRINGE | INTRAVENOUS | Status: AC
  Filled 2020-12-02: qty 10

## 2020-12-02 MED FILL — oxyCODONE HCL 5 MG TABS: 5 | 2 days supply | Qty: 20 | Fill #0

## 2020-12-02 MED FILL — ACETAMINOPHEN 500MG XT STRE: 500 | 3 days supply | Qty: 30 | Fill #0

## 2020-12-02 SURGICAL SUPPLY — 47 items
ADH SKN CLS APL DERMABOND .7 (GAUZE/BANDAGES/DRESSINGS) ×1
APL PRP STRL LF DISP 70% ISPRP (MISCELLANEOUS) ×1
APPLIER CLIP ROT 10 11.4 M/L (STAPLE) ×2
APR CLP MED LRG 11.4X10 (STAPLE) ×1
BAG SPEC RTRVL 10 TROC 200 (ENDOMECHANICALS) ×1
BLADE CLIPPER SURG (BLADE) ×1 IMPLANT
CANISTER SUCT 3000ML PPV (MISCELLANEOUS) ×1 IMPLANT
CHLORAPREP W/TINT 26 (MISCELLANEOUS) ×2 IMPLANT
CLIP APPLIE ROT 10 11.4 M/L (STAPLE) ×1 IMPLANT
COVER MAYO STAND STRL (DRAPES) ×1 IMPLANT
COVER SURGICAL LIGHT HANDLE (MISCELLANEOUS) ×2 IMPLANT
COVER WAND RF STERILE (DRAPES) ×1 IMPLANT
DERMABOND ADVANCED (GAUZE/BANDAGES/DRESSINGS) ×1
DERMABOND ADVANCED .7 DNX12 (GAUZE/BANDAGES/DRESSINGS) ×1 IMPLANT
DRAPE C-ARM 42X72 X-RAY (DRAPES) ×1 IMPLANT
ELECT REM PT RETURN 9FT ADLT (ELECTROSURGICAL) ×2
ELECTRODE REM PT RTRN 9FT ADLT (ELECTROSURGICAL) ×1 IMPLANT
GLOVE BIO SURGEON STRL SZ8 (GLOVE) ×2 IMPLANT
GLOVE BIOGEL PI IND STRL 8 (GLOVE) ×1 IMPLANT
GLOVE BIOGEL PI INDICATOR 8 (GLOVE) ×1
GOWN STRL REUS W/ TWL LRG LVL3 (GOWN DISPOSABLE) ×2 IMPLANT
GOWN STRL REUS W/ TWL XL LVL3 (GOWN DISPOSABLE) ×1 IMPLANT
GOWN STRL REUS W/TWL LRG LVL3 (GOWN DISPOSABLE) ×4
GOWN STRL REUS W/TWL XL LVL3 (GOWN DISPOSABLE) ×2
IV NS 1000ML (IV SOLUTION) ×2
IV NS 1000ML BAXH (IV SOLUTION) IMPLANT
KIT BASIN OR (CUSTOM PROCEDURE TRAY) ×2 IMPLANT
KIT TURNOVER KIT B (KITS) ×2 IMPLANT
NS IRRIG 1000ML POUR BTL (IV SOLUTION) ×2 IMPLANT
PAD ARMBOARD 7.5X6 YLW CONV (MISCELLANEOUS) ×1 IMPLANT
POUCH RETRIEVAL ECOSAC 10 (ENDOMECHANICALS) ×1 IMPLANT
POUCH RETRIEVAL ECOSAC 10MM (ENDOMECHANICALS) ×2
SCISSORS LAP 5X35 DISP (ENDOMECHANICALS) ×2 IMPLANT
SET CHOLANGIOGRAPH 5 50 .035 (SET/KITS/TRAYS/PACK) ×1 IMPLANT
SET IRRIG TUBING LAPAROSCOPIC (IRRIGATION / IRRIGATOR) ×2 IMPLANT
SET TUBE SMOKE EVAC HIGH FLOW (TUBING) ×2 IMPLANT
SLEEVE ENDOPATH XCEL 5M (ENDOMECHANICALS) ×2 IMPLANT
SPECIMEN JAR SMALL (MISCELLANEOUS) ×2 IMPLANT
SUT MNCRL AB 4-0 PS2 18 (SUTURE) ×2 IMPLANT
TOWEL GREEN STERILE (TOWEL DISPOSABLE) ×2 IMPLANT
TOWEL GREEN STERILE FF (TOWEL DISPOSABLE) ×2 IMPLANT
TRAY LAPAROSCOPIC MC (CUSTOM PROCEDURE TRAY) ×2 IMPLANT
TROCAR XCEL BLUNT TIP 100MML (ENDOMECHANICALS) ×2 IMPLANT
TROCAR XCEL NON-BLD 11X100MML (ENDOMECHANICALS) ×2 IMPLANT
TROCAR XCEL NON-BLD 5MMX100MML (ENDOMECHANICALS) ×2 IMPLANT
WARMER LAPAROSCOPE (MISCELLANEOUS) ×2 IMPLANT
WATER STERILE IRR 1000ML POUR (IV SOLUTION) ×1 IMPLANT

## 2020-12-02 NOTE — Anesthesia Postprocedure Evaluation (Signed)
Anesthesia Post Note  Patient: Amy Pittman  Procedure(s) Performed: LAPAROSCOPIC CHOLECYSTECTOMY WITH CHOLANGIOGRAM (N/A )     Patient location during evaluation: PACU Anesthesia Type: General Level of consciousness: sedated and patient cooperative Pain management: pain level controlled Vital Signs Assessment: post-procedure vital signs reviewed and stable Respiratory status: spontaneous breathing Cardiovascular status: stable Anesthetic complications: no   No complications documented.  Last Vitals:  Vitals:   12/02/20 1504 12/02/20 1523  BP: 117/76 117/74  Pulse: (!) 57 (!) 59  Resp: 19 17  Temp: (!) 36.2 C 36.5 C  SpO2: 97% 94%    Last Pain:  Vitals:   12/02/20 1523  TempSrc: Oral  PainSc:                  Lewie Loron

## 2020-12-02 NOTE — Op Note (Signed)
Laparoscopic Cholecystectomy with IOC Procedure Note  Indications: This patient presents with symptomatic gallbladder disease and will undergo laparoscopic cholecystectomy. The procedure has been discussed with the patient. Operative and non operative treatments have been discussed. Risks of surgery include bleeding, infection,  Common bile duct injury,  Injury to the stomach,liver, colon,small intestine, abdominal wall,  Diaphragm,  Major blood vessels,  And the need for an open procedure.  Other risks include worsening of medical problems, death,  DVT and pulmonary embolism, and cardiovascular events.   Medical options have also been discussed. The patient has been informed of long term expectations of surgery and non surgical options,  The patient agrees to proceed.    Pre-operative Diagnosis: gallstone pancreatits  And choledocolithiasis  Post-operative Diagnosis: Same  Surgeon: Dortha Schwalbe  MD   Assistants: OR staff   Anesthesia: General endotracheal anesthesia and Local anesthesia 0.25.% bupivacaine  ASA Class: 2  Procedure Details  The patient was seen again in the Holding Room. The risks, benefits, complications, treatment options, and expected outcomes were discussed with the patient. The possibilities of reaction to medication, pulmonary aspiration, perforation of viscus, bleeding, recurrent infection, finding a normal gallbladder, the need for additional procedures, failure to diagnose a condition, the possible need to convert to an open procedure, and creating a complication requiring transfusion or operation were discussed with the patient. The patient and/or family concurred with the proposed plan, giving informed consent. The site of surgery properly noted/marked. The patient was taken to Operating Room, identified as Remmie Bembenek and the procedure verified as Laparoscopic Cholecystectomy with Intraoperative Cholangiograms. A Time Out was held and the above  information confirmed.  Prior to the induction of general anesthesia, antibiotic prophylaxis was administered. General endotracheal anesthesia was then administered and tolerated well. After the induction, the abdomen was prepped in the usual sterile fashion. The patient was positioned in the supine position with the left arm comfortably tucked, along with some reverse Trendelenburg.  Local anesthetic agent was injected into the skin near the umbilicus and an incision made. The midline fascia was incised and the Hasson technique was used to introduce a 12 mm port under direct vision. It was secured with a figure of eight Vicryl suture placed in the usual fashion. Pneumoperitoneum was then created with CO2 and tolerated well without any adverse changes in the patient's vital signs. Additional trocars were introduced under direct vision with an 11 mm trocar in the epigastrium and two  5 mm trocars in the right upper quadrant. All skin incisions were infiltrated with a local anesthetic agent before making the incision and placing the trocars.   The gallbladder was identified, the fundus grasped and retracted cephalad. Adhesions were lysed bluntly and with the electrocautery where indicated, taking care not to injure any adjacent organs or viscus. The infundibulum was grasped and retracted laterally, exposing the peritoneum overlying the triangle of Calot. This was then divided and exposed in a blunt fashion. The cystic duct was clearly identified and bluntly dissected circumferentially. The junctions of the gallbladder, cystic duct and common bile duct were clearly identified prior to the division of any linear structure.   An incision was made in the cystic duct and the cholangiogram catheter introduced. The catheter was secured using an endoclip. The study showed no stones and good visualization of the distal and proximal biliary tree. The catheter was then removed.   The cystic duct was then  ligated with  surgical clips  on the patient side and  clipped on the gallbladder side and divided. The cystic artery was identified, dissected free, ligated with clips and divided as well. Posterior cystic artery clipped and divided.  The gallbladder was dissected from the liver bed in retrograde fashion with the electrocautery. The gallbladder was removed. The liver bed was irrigated and inspected. Hemostasis was achieved with the electrocautery. Copious irrigation was utilized and was repeatedly aspirated until clear all particulate matter. Hemostasis was achieved with no signs of bleeding or bile leakage.  Pneumoperitoneum was completely reduced after viewing removal of the trocars under direct vision. The wound was thoroughly irrigated and the fascia was then closed with a figure of eight suture; the skin was then closed with 4- 0 monocryl  and a sterile dressing was applied.  Instrument, sponge, and needle counts were correct at closure and at the conclusion of the case.   Findings: Cholecystitis with Cholelithiasis  Estimated Blood Loss: less than 50 mL         Drains: none          Total IV Fluids: per record          Specimens: Gallbladder           Complications: None; patient tolerated the procedure well.         Disposition: PACU - hemodynamically stable.         Condition: stable

## 2020-12-02 NOTE — Progress Notes (Addendum)
Daily Rounding Note  12/02/2020, 8:13 AM  LOS: 3 days   SUBJECTIVE:   Chief complaint:  Biliary pancreatitis and choledocholithiasis    Overall feels well.  No signif  abd pain,  No N/V. May go to lap chole today  OBJECTIVE:         Vital signs in last 24 hours:    Temp:  [98.2 F (36.8 C)-98.8 F (37.1 C)] 98.8 F (37.1 C) (12/28 0617) Pulse Rate:  [56-82] 56 (12/28 0617) Resp:  [18-22] 20 (12/28 0617) BP: (100-125)/(52-96) 112/63 (12/28 0617) SpO2:  [94 %-98 %] 96 % (12/28 0617) Weight:  [90.7 kg] 90.7 kg (12/27 1118) Last BM Date: 11/27/20 Filed Weights   11/29/20 2339 12/01/20 1118  Weight: 90.7 kg 90.7 kg   General: looks well, a bit tired looking.  Not ill or toxic Heart: RRR Chest: clear bil Abdomen: soft, ND.  BS quiet.  Slight RUQ tenderness  Extremities: no CCE Neuro/Psych:  Oriented x 3.  Alert.  No tremors.    Intake/Output from previous day: 12/27 0701 - 12/28 0700 In: 1140 [P.O.:240; I.V.:900] Out: 1 [Blood:1]  Intake/Output this shift: No intake/output data recorded.  Lab Results: Recent Labs    11/30/20 0425 12/01/20 0128 12/02/20 0512  WBC 14.2* 11.8* 13.0*  HGB 12.9 11.7* 11.6*  HCT 38.8 34.1* 33.9*  PLT 341 304 291   BMET Recent Labs    11/30/20 0425 12/01/20 0128 12/02/20 0512  NA 139 138 140  K 3.9 3.4* 3.8  CL 108 106 107  CO2 24 21* 24  GLUCOSE 118* 78 126*  BUN 7 7 6   CREATININE 0.86 0.82 0.76  CALCIUM 8.3* 8.3* 8.6*   LFT Recent Labs    11/30/20 0425 12/01/20 0128 12/02/20 0512  PROT 5.8* 5.5* 5.7*  ALBUMIN 3.1* 3.0* 2.9*  AST 288* 126* 43*  ALT 544* 385* 251*  ALKPHOS 139* 175* 148*  BILITOT 3.0* 1.5* 0.6   PT/INR No results for input(s): LABPROT, INR in the last 72 hours. Hepatitis Panel No results for input(s): HEPBSAG, HCVAB, HEPAIGM, HEPBIGM in the last 72 hours.  Studies/Results: DG ERCP BILIARY & PANCREATIC DUCTS  Result Date:  12/01/2020 CLINICAL DATA:  34 year old female with choledocholithiasis EXAM: ERCP TECHNIQUE: Multiple spot images obtained with the fluoroscopic device and submitted for interpretation post-procedure. FLUOROSCOPY TIME:  Fluoroscopy Time:  1 minutes 49 seconds COMPARISON:  MR 11/29/2020 FINDINGS: Limited intraoperative fluoroscopic spot images during ERCP. Initial image demonstrates endoscope projecting over the upper abdomen. There is subsequently placement of a safety wire and retrograde infusion of contrast. Deployment of a retrieval balloon. IMPRESSION: Limited images during ERCP demonstrates deployment of a retrieval balloon for treatment of choledocholithiasis. Please refer to the dictated operative report for full details of intraoperative findings and procedure. Electronically Signed   By: 12/01/2020 D.O.   On: 12/01/2020 13:27   Scheduled Meds: . pantoprazole  40 mg Oral Q0600  . sodium chloride flush  3 mL Intravenous Q12H   Continuous Infusions: . lactated ringers with kcl 125 mL/hr at 12/01/20 2343  . piperacillin-tazobactam (ZOSYN)  IV 3.375 g (12/02/20 0522)   PRN Meds:.HYDROmorphone (DILAUDID) injection, ondansetron (ZOFRAN) IV   ASSESMENT:   *  Choledocholithiasis.  Biliary pancreatitis.   12/27 ERCP w sphinct, stone extraction LFTs, Lipase steadily improving  *   Cholelithiasis.     PLAN   *   GI signing off.  Timing lap chole per gen  surgAzucena Freed  12/02/2020, 8:13 AM Phone [phone]    Attending physician's note   I have taken an interval history, reviewed the chart and examined the patient. I agree with the Advanced Practitioner's note, impression and recommendations.   Doing well.  LFTs improving.  Lipase near normal. For lap chole today We will sign off for now. Do recommend pancreatic imaging in 4 to 6 weeks by means of CT/US D/W Claiborne Billings (surgery)  Carmell Austria, MD [facility] [phone]

## 2020-12-02 NOTE — Anesthesia Procedure Notes (Signed)
Procedure Name: Intubation Date/Time: 12/02/2020 1:09 PM Performed by: Waynard Edwards, CRNA Pre-anesthesia Checklist: Patient identified, Emergency Drugs available, Suction available and Patient being monitored Patient Re-evaluated:Patient Re-evaluated prior to induction Oxygen Delivery Method: Circle system utilized Preoxygenation: Pre-oxygenation with 100% oxygen Induction Type: IV induction Ventilation: Mask ventilation without difficulty Laryngoscope Size: Miller and 2 Grade View: Grade I Tube type: Oral Tube size: 7.0 mm Number of attempts: 1 Airway Equipment and Method: Stylet Placement Confirmation: ETT inserted through vocal cords under direct vision,  positive ETCO2 and breath sounds checked- equal and bilateral Secured at: 22 cm Tube secured with: Tape Dental Injury: Teeth and Oropharynx as per pre-operative assessment

## 2020-12-02 NOTE — Interval H&P Note (Signed)
History and Physical Interval Note:  12/02/2020 12:24 PM  Amy Pittman  has presented today for surgery, with the diagnosis of gallstone and pancreatitis.  The various methods of treatment have been discussed with the patient and family. After consideration of risks, benefits and other options for treatment, the patient has consented to  Procedure(s): LAPAROSCOPIC CHOLECYSTECTOMY (N/A) as a surgical intervention.  The patient's history has been reviewed, patient examined, no change in status, stable for surgery.  I have reviewed the patient's chart and labs.  Questions were answered to the patient's satisfaction.     Clovis Pu Ariez Neilan MD   Pt seen, examined and agree  Chart reviewed and needs laparoscopic cholecystectomy/ cholangiogram for gallstone pancreatitis   The procedure has been discussed with the patient. Operative and non operative treatments have been discussed. Risks of surgery include bleeding, infection,  Common bile duct injury,  Injury to the stomach,liver, colon,small intestine, abdominal wall,  Diaphragm,  Major blood vessels,  And the need for an open procedure.  Other risks include worsening of medical problems, death,  DVT and pulmonary embolism, and cardiovascular events.   Medical options have also been discussed. The patient has been informed of long term expectations of surgery and non surgical options,  The patient agrees to proceed.

## 2020-12-02 NOTE — Anesthesia Preprocedure Evaluation (Addendum)
Anesthesia Evaluation  Patient identified by MRN, date of birth, ID band Patient awake    Reviewed: Allergy & Precautions, NPO status , Patient's Chart, lab work & pertinent test results  History of Anesthesia Complications Negative for: history of anesthetic complications  Airway Mallampati: II  TM Distance: >3 FB Neck ROM: Full    Dental  (+) Dental Advisory Given, Teeth Intact   Pulmonary neg pulmonary ROS,  11/29/2020 SARS coronavirus NEG   breath sounds clear to auscultation       Cardiovascular negative cardio ROS   Rhythm:Regular Rate:Normal     Neuro/Psych  Headaches, Seizures -, Well Controlled,  Chronic back pain    GI/Hepatic negative GI ROS, Elevated LFTs: CBD stones   Endo/Other  obese  Renal/GU negative Renal ROS     Musculoskeletal   Abdominal (+) + obese,   Peds  Hematology negative hematology ROS (+)   Anesthesia Other Findings   Reproductive/Obstetrics                            Anesthesia Physical  Anesthesia Plan  ASA: II  Anesthesia Plan: General   Post-op Pain Management:    Induction: Intravenous  PONV Risk Score and Plan: 4 or greater and Ondansetron, Dexamethasone, Scopolamine patch - Pre-op, Midazolam and Treatment may vary due to age or medical condition  Airway Management Planned: Oral ETT  Additional Equipment: None  Intra-op Plan:   Post-operative Plan: Extubation in OR  Informed Consent: I have reviewed the patients History and Physical, chart, labs and discussed the procedure including the risks, benefits and alternatives for the proposed anesthesia with the patient or authorized representative who has indicated his/her understanding and acceptance.     Dental advisory given  Plan Discussed with: CRNA  Anesthesia Plan Comments:        Anesthesia Quick Evaluation

## 2020-12-02 NOTE — Progress Notes (Signed)
Patient off floor to OR. Informed consent signed, CHG bath completed and Pre-meds administered.

## 2020-12-02 NOTE — Transfer of Care (Signed)
Immediate Anesthesia Transfer of Care Note  Patient: Amy Pittman  Procedure(s) Performed: LAPAROSCOPIC CHOLECYSTECTOMY WITH CHOLANGIOGRAM (N/A )  Patient Location: PACU  Anesthesia Type:General  Level of Consciousness: awake and alert   Airway & Oxygen Therapy: Patient Spontanous Breathing and Patient connected to face mask oxygen  Post-op Assessment: Report given to RN and Post -op Vital signs reviewed and stable  Post vital signs: Reviewed and stable  Last Vitals:  Vitals Value Taken Time  BP 111/67 12/02/20 1449  Temp    Pulse 66 12/02/20 1450  Resp 22 12/02/20 1450  SpO2 98 % 12/02/20 1450  Vitals shown include unvalidated device data.  Last Pain:  Vitals:   12/02/20 0915  TempSrc:   PainSc: 1          Complications: No complications documented.

## 2020-12-02 NOTE — Discharge Instructions (Signed)
[facility] ° °Please arrive at least 30 min before your appointment to complete your check in paperwork.  If you are unable to arrive 30 min prior to your appointment time we may have to cancel or reschedule you. °LAPAROSCOPIC SURGERY: POST OP INSTRUCTIONS °Always review your discharge instruction sheet given to you by the facility where your surgery was performed. °IF YOU HAVE DISABILITY OR FAMILY LEAVE FORMS, YOU MUST BRING THEM TO THE OFFICE FOR PROCESSING.   °DO NOT GIVE THEM TO YOUR DOCTOR. ° °PAIN CONTROL ° °1. First take acetaminophen (Tylenol) AND/or ibuprofen (Advil) to control your pain after surgery.  Follow directions on package.  Taking acetaminophen (Tylenol) and/or ibuprofen (Advil) regularly after surgery will help to control your pain and lower the amount of prescription pain medication you may need.  You should not take more than 4,000 mg (4 grams) of acetaminophen (Tylenol) in 24 hours.  You should not take ibuprofen (Advil), aleve, motrin, naprosyn or other NSAIDS if you have a history of stomach ulcers or chronic kidney disease.  °2. A prescription for pain medication may be given to you upon discharge.  Take your pain medication as prescribed, if you still have uncontrolled pain after taking acetaminophen (Tylenol) or ibuprofen (Advil). °3. Use ice packs to help control pain. °4. If you need a refill on your pain medication, please contact your pharmacy.  They will contact our office to request authorization. Prescriptions will not be filled after 5pm or on week-ends. ° °HOME MEDICATIONS °5. Take your usually prescribed medications unless otherwise directed. ° °DIET °6. You should follow a light diet the first few days after arrival home.  Be sure to include lots of fluids daily. Avoid fatty, fried foods.  ° °CONSTIPATION °7. It is common to experience some constipation after surgery and if you are taking pain medication.  Increasing fluid intake and taking a stool  softener (such as Colace) will usually help or prevent this problem from occurring.  A mild laxative (Milk of Magnesia or Miralax) should be taken according to package instructions if there are no bowel movements after 48 hours. ° °WOUND/INCISION CARE °8. Most patients will experience some swelling and bruising in the area of the incisions.  Ice packs will help.  Swelling and bruising can take several days to resolve.  °9. Unless discharge instructions indicate otherwise, follow guidelines below  °a. STERI-STRIPS - you may remove your outer bandages 48 hours after surgery, and you may shower at that time.  You have steri-strips (small skin tapes) in place directly over the incision.  These strips should be left on the skin for 7-10 days.   °b. DERMABOND/SKIN GLUE - you may shower in 24 hours.  The glue will flake off over the next 2-3 weeks. °10. Any sutures or staples will be removed at the office during your follow-up visit. ° °ACTIVITIES °11. You may resume regular (light) daily activities beginning the next day--such as daily self-care, walking, climbing stairs--gradually increasing activities as tolerated.  You may have sexual intercourse when it is comfortable.  Refrain from any heavy lifting or straining until approved by your doctor. °a. You may drive when you are no longer taking prescription pain medication, you can comfortably wear a seatbelt, and you can safely maneuver your car and apply brakes. ° °FOLLOW-UP °12. You should see your doctor in the office for a follow-up appointment approximately 2-3 weeks after your surgery.  You should have been given your post-op/follow-up appointment when   your surgery was scheduled.  If you did not receive a post-op/follow-up appointment, make sure that you call for this appointment within a day or two after you arrive home to insure a convenient appointment time. ° ° °WHEN TO CALL YOUR DOCTOR: °1. Fever over 101.0 °2. Inability to urinate °3. Continued bleeding from  incision. °4. Increased pain, redness, or drainage from the incision. °5. Increasing abdominal pain ° °The clinic staff is available to answer your questions during regular business hours.  Please don’t hesitate to call and ask to speak to one of the nurses for clinical concerns.  If you have a medical emergency, go to the nearest emergency room or call 911.  A surgeon from [facility] is always on call at the hospital. °[address], [address], [place], [place]  [address] ? [address] [address], [place],    [address] °[phone] ? [phone] ? FAX [phone] ° °••••••••• ° ° °Managing Your Pain After Surgery Without Opioids ° ° ° °Thank you for participating in our program to help patients manage their pain after surgery without opioids. This is part of our effort to provide you with the best care possible, without exposing you or your family to the risk that opioids pose. ° °What pain can I expect after surgery? °You can expect to have some pain after surgery. This is normal. The pain is typically worse the day after surgery, and quickly begins to get better. °Many studies have found that many patients are able to manage their pain after surgery with Over-the-Counter (OTC) medications such as Tylenol and Motrin. If you have a condition that does not allow you to take Tylenol or Motrin, notify your surgical team. ° °How will I manage my pain? °The best strategy for controlling your pain after surgery is around the clock pain control with Tylenol (acetaminophen) and Motrin (ibuprofen or Advil). Alternating these medications with each other allows you to maximize your pain control. In addition to Tylenol and Motrin, you can use heating pads or ice packs on your incisions to help reduce your pain. ° °How will I alternate your regular strength over-the-counter pain medication? °You will take a dose of pain medication every three hours. °; Start by taking 650 mg of Tylenol (2 pills of 325  mg) °; 3 hours later take 600 mg of Motrin (3 pills of 200 mg) °; 3 hours after taking the Motrin take 650 mg of Tylenol °; 3 hours after that take 600 mg of Motrin. ° ° °- 1 - ° °See example - if your first dose of Tylenol is at 12:00 PM ° ° °12:00 PM Tylenol 650 mg (2 pills of 325 mg)  °3:00 PM Motrin 600 mg (3 pills of 200 mg)  °6:00 PM Tylenol 650 mg (2 pills of 325 mg)  °9:00 PM Motrin 600 mg (3 pills of 200 mg)  °Continue alternating every 3 hours  ° °We recommend that you follow this schedule around-the-clock for at least 3 days after surgery, or until you feel that it is no longer needed. Use the table on the last page of this handout to keep track of the medications you are taking. °Important: °Do not take more than 3000mg of Tylenol or 3200mg of Motrin in a 24-hour period. °Do not take ibuprofen/Motrin if you have a history of bleeding stomach ulcers, severe kidney disease, &/or actively taking a blood thinner ° °What if I still have pain? °If you have pain that is not   controlled with the over-the-counter pain medications (Tylenol and Motrin or Advil) you might have what we call “breakthrough” pain. You will receive a prescription for a small amount of an opioid pain medication such as Oxycodone, Tramadol, or Tylenol with Codeine. Use these opioid pills in the first 24 hours after surgery if you have breakthrough pain. Do not take more than 1 pill every 4-6 hours. ° °If you still have uncontrolled pain after using all opioid pills, don't hesitate to call our staff using the number provided. We will help make sure you are managing your pain in the best way possible, and if necessary, we can provide a prescription for additional pain medication. ° ° °Day 1   ° °Time  °Name of Medication Number of pills taken  °Amount of Acetaminophen  °Pain Level  ° °Comments  °AM PM       °AM PM       °AM PM       °AM PM       °AM PM       °AM PM       °AM PM       °AM PM       °Total Daily amount of Acetaminophen °Do not  take more than  3,000 mg per day    ° ° °Day 2   ° °Time  °Name of Medication Number of pills °taken  °Amount of Acetaminophen  °Pain Level  ° °Comments  °AM PM       °AM PM       °AM PM       °AM PM       °AM PM       °AM PM       °AM PM       °AM PM       °Total Daily amount of Acetaminophen °Do not take more than  3,000 mg per day    ° ° °Day 3   ° °Time  °Name of Medication Number of pills taken  °Amount of Acetaminophen  °Pain Level  ° °Comments  °AM PM       °AM PM       °AM PM       °AM PM       ° ° ° °AM PM       °AM PM       °AM PM       °AM PM       °Total Daily amount of Acetaminophen °Do not take more than  3,000 mg per day    ° ° °Day 4   ° °Time  °Name of Medication Number of pills taken  °Amount of Acetaminophen  °Pain Level  ° °Comments  °AM PM       °AM PM       °AM PM       °AM PM       °AM PM       °AM PM       °AM PM       °AM PM       °Total Daily amount of Acetaminophen °Do not take more than  3,000 mg per day    ° ° °Day 5   ° °Time  °Name of Medication Number °of pills taken  °Amount of Acetaminophen  °Pain Level  ° °Comments  °AM PM       °AM PM       °AM   PM       °AM PM       °AM PM       °AM PM       °AM PM       °AM PM       °Total Daily amount of Acetaminophen °Do not take more than  3,000 mg per day    ° ° ° °Day 6   ° °Time  °Name of Medication Number of pills °taken  °Amount of Acetaminophen  °Pain Level  °Comments  °AM PM       °AM PM       °AM PM       °AM PM       °AM PM       °AM PM       °AM PM       °AM PM       °Total Daily amount of Acetaminophen °Do not take more than  3,000 mg per day    ° ° °Day 7   ° °Time  °Name of Medication Number of pills taken  °Amount of Acetaminophen  °Pain Level  ° °Comments  °AM PM       °AM PM       °AM PM       °AM PM       °AM PM       °AM PM       °AM PM       °AM PM       °Total Daily amount of Acetaminophen °Do not take more than  3,000 mg per day    ° ° ° ° °For additional information about how and where to safely dispose of unused  opioid °medications - https://www.morepowerfulnc.org ° °Disclaimer: This document contains information and/or instructional materials adapted from [facility] for the typical patient with your condition. It does not replace medical advice from your health care provider because your experience may differ from that of the °typical patient. Talk to your health care provider if you have any questions about this °document, your condition or your treatment plan. °Adapted from [facility] ° ° °Gallbladder Eating Plan °If you have a gallbladder condition, you may have trouble digesting fats. Eating a low-fat diet can help reduce your symptoms, and may be helpful before and after having surgery to remove your gallbladder (cholecystectomy). Your health care provider may recommend that you work with a diet and nutrition specialist (dietitian) to help you reduce the amount of fat in your diet. °What are tips for following this plan? °General guidelines °· Limit your fat intake to less than 30% of your total daily calories. If you eat around 1,800 calories each day, this is less than 60 grams (g) of fat per day. °· Fat is an important part of a healthy diet. Eating a low-fat diet can make it hard to maintain a healthy body weight. Ask your dietitian how much fat, calories, and other nutrients you need each day. °· Eat small, frequent meals throughout the day instead of three large meals. °· Drink at least 8-10 cups of fluid a day. Drink enough fluid to keep your urine clear or pale yellow. °· Limit alcohol intake to no more than 1 drink a day for nonpregnant women and 2 drinks a day for men. One drink equals 12 oz of beer, 5 oz of wine, or 1½ oz of hard liquor. °Reading food labels °· Check Nutrition Facts on food labels   for the amount of fat per serving. Choose foods with less than 3 grams of fat per serving. °Shopping °· Choose nonfat and low-fat healthy foods. Look for the words “nonfat,” “low fat,” or “fat  free.” °· Avoid buying processed or prepackaged foods. °Cooking °· Cook using low-fat methods, such as baking, broiling, grilling, or boiling. °· Cook with small amounts of healthy fats, such as olive oil, grapeseed oil, canola oil, or sunflower oil. °What foods are recommended? ° °· All fresh, frozen, or canned fruits and vegetables. °· Whole grains. °· Low-fat or non-fat (skim) milk and yogurt. °· Lean meat, skinless poultry, fish, eggs, and beans. °· Low-fat protein supplement powders or drinks. °· Spices and herbs. °What foods are not recommended? °· High-fat foods. These include baked goods, fast food, fatty cuts of meat, ice cream, french toast, sweet rolls, pizza, cheese bread, foods covered with butter, creamy sauces, or cheese. °· Fried foods. These include french fries, tempura, battered fish, breaded chicken, fried breads, and sweets. °· Foods with strong odors. °· Foods that cause bloating and gas. °Summary °· A low-fat diet can be helpful if you have a gallbladder condition, or before and after gallbladder surgery. °· Limit your fat intake to less than 30% of your total daily calories. This is about 60 g of fat if you eat 1,800 calories each day. °· Eat small, frequent meals throughout the day instead of three large meals. °This information is not intended to replace advice given to you by your health care provider. Make sure you discuss any questions you have with your health care provider. °Document Revised: 03/15/2019 Document Reviewed: 12/30/2016 °Elsevier Patient Education © 2020 [employer] Inc. ° °

## 2020-12-02 NOTE — Progress Notes (Signed)
1 Day Post-Op  Subjective: Feels well today.  No issues.  ERCP went well with 1 stone removed.  No pain today.  ROS: See above, otherwise other systems negative  Objective: Vital signs in last 24 hours: Temp:  [98.2 F (36.8 C)-98.8 F (37.1 C)] 98.8 F (37.1 C) (12/28 0617) Pulse Rate:  [56-82] 56 (12/28 0617) Resp:  [18-22] 20 (12/28 0617) BP: (100-125)/(52-96) 112/63 (12/28 0617) SpO2:  [94 %-98 %] 96 % (12/28 0617) Weight:  [90.7 kg] 90.7 kg (12/27 1118) Last BM Date: 11/27/20  Intake/Output from previous day: 12/27 0701 - 12/28 0700 In: 1140 [P.O.:240; I.V.:900] Out: 1 [Blood:1] Intake/Output this shift: No intake/output data recorded.  PE: Heart: regular Lungs: CTAB Abd: soft, NT, ND, +BS  Lab Results:  Recent Labs    12/01/20 0128 12/02/20 0512  WBC 11.8* 13.0*  HGB 11.7* 11.6*  HCT 34.1* 33.9*  PLT 304 291   BMET Recent Labs    12/01/20 0128 12/02/20 0512  NA 138 140  K 3.4* 3.8  CL 106 107  CO2 21* 24  GLUCOSE 78 126*  BUN 7 6  CREATININE 0.82 0.76  CALCIUM 8.3* 8.6*   PT/INR No results for input(s): LABPROT, INR in the last 72 hours. CMP     Component Value Date/Time   NA 140 12/02/2020 0512   K 3.8 12/02/2020 0512   CL 107 12/02/2020 0512   CO2 24 12/02/2020 0512   GLUCOSE 126 (H) 12/02/2020 0512   BUN 6 12/02/2020 0512   CREATININE 0.76 12/02/2020 0512   CALCIUM 8.6 (L) 12/02/2020 0512   PROT 5.7 (L) 12/02/2020 0512   ALBUMIN 2.9 (L) 12/02/2020 0512   AST 43 (H) 12/02/2020 0512   ALT 251 (H) 12/02/2020 0512   ALKPHOS 148 (H) 12/02/2020 0512   BILITOT 0.6 12/02/2020 0512   GFRNONAA >60 12/02/2020 0512   GFRAA 159 07/20/2007 1451   Lipase     Component Value Date/Time   LIPASE 101 (H) 12/02/2020 0512       Studies/Results: DG ERCP BILIARY & PANCREATIC DUCTS  Result Date: 12/01/2020 CLINICAL DATA:  34 year old female with choledocholithiasis EXAM: ERCP TECHNIQUE: Multiple spot images obtained with the  fluoroscopic device and submitted for interpretation post-procedure. FLUOROSCOPY TIME:  Fluoroscopy Time:  1 minutes 49 seconds COMPARISON:  MR 11/29/2020 FINDINGS: Limited intraoperative fluoroscopic spot images during ERCP. Initial image demonstrates endoscope projecting over the upper abdomen. There is subsequently placement of a safety wire and retrograde infusion of contrast. Deployment of a retrieval balloon. IMPRESSION: Limited images during ERCP demonstrates deployment of a retrieval balloon for treatment of choledocholithiasis. Please refer to the dictated operative report for full details of intraoperative findings and procedure. Electronically Signed   By: Corrie Mckusick D.O.   On: 12/01/2020 13:27    Anti-infectives: Anti-infectives (From admission, onward)   Start     Dose/Rate Route Frequency Ordered Stop   11/30/20 1400  piperacillin-tazobactam (ZOSYN) IVPB 3.375 g        3.375 g 12.5 mL/hr over 240 Minutes Intravenous Every 8 hours 11/30/20 1107     11/29/20 2100  piperacillin-tazobactam (ZOSYN) IVPB 3.375 g        3.375 g 100 mL/hr over 30 Minutes Intravenous  Once 11/29/20 2049 11/29/20 2144       Assessment/Plan Choledocholithasis -s/p ERCP with stone extraction  Gallstone pancreatitis -lipase down to 100 -no pain -proceed with lap chole today -I have explained the procedure, risks, and aftercare of cholecystectomy.  Risks include but are not limited to bleeding, infection, wound problems, anesthesia, diarrhea, bile leak, injury to common bile duct/liver/intestine.  She seems to understand and agrees to proceed. -will take patient onto our service.  May be able to go home later today after surgery vs tomorrow.  FEN - NPO, IVFs, Tylenol/Celebrex on call to OR VTE - none currently ID - [email or web address]   LOS: 3 days    Letha Cape , [facility] 12/02/2020, 9:19 AM Please see Amion for pager number during day hours 7:00am-4:30pm or 7:00am -11:30am on  weekends

## 2020-12-03 ENCOUNTER — Encounter (HOSPITAL_COMMUNITY): Payer: Self-pay | Admitting: Surgery

## 2020-12-03 NOTE — Discharge Summary (Signed)
Patient ID: Amy Pittman [id number] [birth year] 34 y.o.  Admit date: 11/29/2020 Discharge date: 12/03/2020  Admitting Diagnosis: Choledocholithiasis Gallstone pancreatitis  Discharge Diagnosis Patient Active Problem List   Diagnosis Date Noted  . RUQ abdominal pain   . Gallstones   . Choledocholithiasis   . Pancreatitis 11/29/2020  . Cellulitis of left upper eyelid 10/11/2018  . Low back pain 05/28/2015  . Awareness alteration, transient 03/07/2015  . Routine health maintenance 05/10/2012  . Generalized seizures (HCC)   . Irregular menses   s/p ERCP S/p lap chole  Consultants Dr. Chales Abrahams, GI Dr. Carolynne Edouard, GS  Reason for Admission: Amy Pittman is a 34 y.o. female with medical history significant of seizures who presents with worsening abdominal pain.  Patient states that she has had intermittent right upper quadrant pain for the last 1-1/2 months.  This pain consists of heavy right upper quadrant pain which can be as bad as a 10 out of 10 at times and radiating to her epigastrium/chest and lasts 2 to 3 hours at a time.  She reports she has been having this on average every 3 days or so.              Today her pain started around 9 AM and was quite severe.  She has had some associated nausea.  Pain was again 10 out of 10.  She had nausea with vomiting for the first time and pain on palpation of her right upper quadrant which caused her to seek medical attention out of concern for her gallbladder.             She denies fever, shortness of breath, cough, constipation, diarrhea.  She denies significant alcohol use or NSAID use.  ED Course: Vitals in ED stable.  Lab work-up showed BMP with glucose 122.  LFTs with AST 286, ALT 574, ALP 156, T bili 2.5.  CBC with leukocytosis to 15.4.  Lipase greater than 10,000.  Respiratory panel for flu and Covid negative.  Ultrasound showed cholelithiasis without cholecystitis as well as fatty liver.  Patient given a dose  of Zosyn and pain control in the ED.  General surgery was consulted.  GI also consulted but have not yet responded, per EDP.  Procedures ERCP, Dr. Chales Abrahams 12/28 Lap chole with IOC, Dr. Luisa Hart 12/29  Hospital Course:  The patient was admitted and placed on NPO and IVFs to help her pancreatitis.  She underwent MRCP which was suggestive of a stone in her CBD.  Her pancreatitis quickly resolved.  She underwent an ERCP with stone extraction.  She tolerated this well and the following day underwent lap chole.  She also tolerated this well.  On POD 0, she was tolerating liquids, mobilizing, had good pain control, and was voiding.  She was felt stable for DC home.   Allergies as of 12/02/2020   No Known Allergies     Medication List    TAKE these medications   acetaminophen 500 MG tablet Commonly known as: TYLENOL Take 2 tablets (1,000 mg total) by mouth every 6 (six) hours as needed for mild pain or headache. What changed: how much to take   Mirena (52 MG) 20 MCG/24HR IUD Generic drug: levonorgestrel 1 Intra Uterine Device by Intrauterine route once. Every 7 years   naproxen sodium 220 MG tablet Commonly known as: ALEVE Take 220 mg by mouth daily as needed (headache).   oxyCODONE 5 MG immediate release tablet Commonly known as: Oxy IR/ROXICODONE Take 1-2  tablets (5-10 mg total) by mouth every 4 (four) hours as needed for moderate pain or severe pain.         Follow-up Information    [facility]. Schedule an appointment as soon as possible for a visit.   Contact information: [address] [place] [address] [phone]       Surgery, [place] On 12/23/2020.   Specialty: General Surgery Why: 9 am, arrive by 8:30am for paperwork and check in process. Please bring a copy of your photo ID and insurance card. Please arrive 30 minutes prior to your appointment for paperwork.  Contact information: [address] [place] [address] [phone]               Signed: Barnetta Chapel, [facility] 12/03/2020, 7:47 AM Please see Amion for pager number during day hours 7:00am-4:30pm, 7-11:30am on Weekends

## 2020-12-04 ENCOUNTER — Other Ambulatory Visit: Payer: Self-pay | Admitting: *Deleted

## 2020-12-04 LAB — SURGICAL PATHOLOGY

## 2020-12-04 NOTE — Patient Outreach (Signed)
[facility]) Care Management  12/04/2020  [name] [facility] 1986/12/05 [id number]   Transition of care telephone call  Referral received:12/01/20 Initial outreach:12/04/20 Insurance: [facility] list pre referral   Initial unsuccessful telephone call to patient's preferred number in order to complete transition of care assessment; no answer, left HIPAA compliant voicemail message requesting return call.   Objective: Per the electronic medical record, Amy Pittman  was hospitalized at [facility] 12/25-12/28/21 for Pancreatitis , ERCP stone extraction, Laparoscopic Chloecystectomy . She was discharged to home on 12/02/20 without the need for home health services or durable medical equipment per the discharge summary.   Plan: This RNCM will route unsuccessful outreach letter with [facility] Care Management pamphlet and 24 hour Nurse Advice Line Magnet to [employer] Care Management clinical pool to be mailed to patient's home address. This RNCM will attempt another outreach within 4 business days.  Egbert Garibaldi, RN, BSN  [facility] Care Management,Care Management Coordinator  [phone]- Mobile [phone]- Toll Free Main Office

## 2020-12-09 ENCOUNTER — Other Ambulatory Visit: Payer: Self-pay | Admitting: *Deleted

## 2020-12-09 NOTE — Patient Outreach (Signed)
[facility]) Care Management  12/09/2020  Amy Pittman [facility] 28-May-1986 [id number]   Transition of care call Referral received: 12/01/20 Initial outreach attempt:12/04/20 Insurance: [employer] per referral    2nd unsuccessful telephone call to patient's preferred contact number in order to complete post hospital discharge transition of care assessment , no answer left HIPAA compliant message requesting return call.    Objective: Per the electronic medical record, Amy Pittman  was hospitalized at [facility] 12/25-12/28/21 for Pancreatitis , ERCP stone extraction, Laparoscopic Chloecystectomy . She was discharged to home on 12/02/20 without the need for home health services or durable medical equipment per the discharge summary.      Plan If no return call from patient will attempt 3rd outreach in the next 4 business days.    Amy Garibaldi, RN, BSN  [facility] Care Management,Care Management Coordinator  [phone]- Mobile [phone]- Toll Free Main Office

## 2020-12-11 ENCOUNTER — Encounter: Payer: Self-pay | Admitting: Gastroenterology

## 2020-12-12 ENCOUNTER — Other Ambulatory Visit: Payer: Self-pay | Admitting: *Deleted

## 2020-12-12 NOTE — Patient Outreach (Signed)
[facility]) Care Management  12/12/2020  Amy Pittman [facility] [birth year] [id number]   Transition of care call Referral received: 12/01/20 Initial outreach attempt: 12/04/20 Insurance: [employer] unsuccessful telephone call to patient's preferred contact number in order to complete post hospital discharge transition of care assessment; no answer mailbox is full unable to leave a message.   Objective: Per the electronic medical record,Amy Neustelwas hospitalized at [facility] 12/25-12/28/21 for Pancreatitis , ERCP stone extraction, Laparoscopic Chloecystectomy. Shewas discharged to home on 12/28/21without the need for home health services or durable medical equipment per the discharge summary.   Plan: If no return call from patient,will plan return call in the next 3 weeks.    Joylene Draft, RN, BSN  [place] Management Coordinator  [phone]- Mobile [phone]- Toll Free Main Office

## 2020-12-31 ENCOUNTER — Other Ambulatory Visit: Payer: Self-pay | Admitting: *Deleted

## 2020-12-31 NOTE — Patient Outreach (Signed)
[facility]) Care Management  12/31/2020  Daziyah Cogan [facility] 1986/11/01 [id number]   Transition of care /Case Closure Unsuccessful outreach    Referral received:12/01/20 Initial outreach:12/04/20 Insurance: UMR   #4 Outreach attempt  Unable to complete post hospital discharge transition of care assessment. No return call form patient after 3 call attempts and no response to request to contact RN Care Coordinator in unsuccessful outreach letter mailed to home on 12/04/20.  Objective: Per the electronic medical record,Niasha Neustelwas hospitalized at [facility] 12/25-12/28/21 for Pancreatitis , ERCP stone extraction, Laparoscopic Chloecystectomy. Shewas discharged to home on 12/28/21without the need for home health services or durable medical equipment per the discharge summary.  Plan Case closed to [place] care management Unsuccessful outreach attempts x 4, no response from [employer].    Joylene Draft, RN, BSN  [place] Management Coordinator  [phone]- Mobile [phone]- Toll Free Main Office

## 2021-04-20 ENCOUNTER — Other Ambulatory Visit: Payer: Self-pay | Admitting: Obstetrics and Gynecology

## 2021-04-20 DIAGNOSIS — N6310 Unspecified lump in the right breast, unspecified quadrant: Secondary | ICD-10-CM

## 2021-04-21 ENCOUNTER — Other Ambulatory Visit: Payer: Self-pay | Admitting: Obstetrics and Gynecology

## 2021-04-21 DIAGNOSIS — N6310 Unspecified lump in the right breast, unspecified quadrant: Secondary | ICD-10-CM

## 2021-04-30 DIAGNOSIS — Z8041 Family history of malignant neoplasm of ovary: Secondary | ICD-10-CM | POA: Diagnosis not present

## 2021-04-30 DIAGNOSIS — N6310 Unspecified lump in the right breast, unspecified quadrant: Secondary | ICD-10-CM | POA: Diagnosis not present

## 2021-05-12 DIAGNOSIS — Z01419 Encounter for gynecological examination (general) (routine) without abnormal findings: Secondary | ICD-10-CM | POA: Diagnosis not present

## 2021-05-12 DIAGNOSIS — Z124 Encounter for screening for malignant neoplasm of cervix: Secondary | ICD-10-CM | POA: Diagnosis not present

## 2021-05-21 ENCOUNTER — Other Ambulatory Visit: Payer: Self-pay

## 2021-05-21 ENCOUNTER — Ambulatory Visit
Admission: RE | Admit: 2021-05-21 | Discharge: 2021-05-21 | Disposition: A | Discharge status: Home / Self Care | Payer: 59 | Source: Ambulatory Visit | Attending: Obstetrics and Gynecology | Admitting: Obstetrics and Gynecology

## 2021-05-21 ENCOUNTER — Other Ambulatory Visit: Payer: Self-pay | Admitting: Obstetrics and Gynecology

## 2021-05-21 DIAGNOSIS — N6310 Unspecified lump in the right breast, unspecified quadrant: Secondary | ICD-10-CM

## 2021-05-21 DIAGNOSIS — N6312 Unspecified lump in the right breast, upper inner quadrant: Secondary | ICD-10-CM | POA: Diagnosis not present

## 2021-05-21 DIAGNOSIS — R921 Mammographic calcification found on diagnostic imaging of breast: Secondary | ICD-10-CM | POA: Diagnosis not present

## 2021-05-25 ENCOUNTER — Other Ambulatory Visit: Payer: 59

## 2021-05-27 ENCOUNTER — Other Ambulatory Visit: Payer: Self-pay

## 2021-05-27 ENCOUNTER — Other Ambulatory Visit: Payer: 59

## 2021-05-27 ENCOUNTER — Ambulatory Visit
Admission: RE | Admit: 2021-05-27 | Discharge: 2021-05-27 | Disposition: A | Discharge status: Home / Self Care | Payer: 59 | Source: Ambulatory Visit | Attending: Obstetrics and Gynecology | Admitting: Obstetrics and Gynecology

## 2021-05-27 DIAGNOSIS — N6310 Unspecified lump in the right breast, unspecified quadrant: Secondary | ICD-10-CM

## 2021-05-27 DIAGNOSIS — C50211 Malignant neoplasm of upper-inner quadrant of right female breast: Secondary | ICD-10-CM | POA: Diagnosis not present

## 2021-05-28 ENCOUNTER — Other Ambulatory Visit: Payer: Self-pay | Admitting: Obstetrics and Gynecology

## 2021-05-28 ENCOUNTER — Telehealth: Payer: Self-pay | Admitting: Hematology and Oncology

## 2021-05-28 DIAGNOSIS — C50911 Malignant neoplasm of unspecified site of right female breast: Secondary | ICD-10-CM

## 2021-05-28 NOTE — Telephone Encounter (Signed)
Received a new pt referral from [facility] for new dx of breast cancer. Amy Pittman returned my call to confirm an appt that has been scheduled for her to see Amy Pittman on 6/28 at 4:15pm. Pt aware to arrive 15 minutes early.

## 2021-06-01 ENCOUNTER — Other Ambulatory Visit: Payer: Self-pay

## 2021-06-01 ENCOUNTER — Ambulatory Visit
Admission: RE | Admit: 2021-06-01 | Discharge: 2021-06-01 | Disposition: A | Discharge status: Home / Self Care | Payer: 59 | Source: Ambulatory Visit | Attending: Obstetrics and Gynecology | Admitting: Obstetrics and Gynecology

## 2021-06-01 DIAGNOSIS — C50211 Malignant neoplasm of upper-inner quadrant of right female breast: Secondary | ICD-10-CM | POA: Diagnosis not present

## 2021-06-01 DIAGNOSIS — C50911 Malignant neoplasm of unspecified site of right female breast: Secondary | ICD-10-CM

## 2021-06-01 MED ORDER — GADOBUTROL 1 MMOL/ML IV SOLN
10.0000 mL | Freq: Once | INTRAVENOUS | Status: AC | PRN
  Administered 2021-06-01: 10 mL via INTRAVENOUS

## 2021-06-02 ENCOUNTER — Inpatient Hospital Stay: Payer: 59 | Attending: Hematology and Oncology | Admitting: Hematology and Oncology

## 2021-06-02 DIAGNOSIS — C50211 Malignant neoplasm of upper-inner quadrant of right female breast: Secondary | ICD-10-CM | POA: Insufficient documentation

## 2021-06-02 DIAGNOSIS — Z170 Estrogen receptor positive status [ER+]: Secondary | ICD-10-CM | POA: Insufficient documentation

## 2021-06-02 MED ORDER — TAMOXIFEN CITRATE 20 MG PO TABS: 20.0000 mg | ORAL_TABLET | Freq: Every day | ORAL | 0 refills | Status: DC

## 2021-06-02 NOTE — Assessment & Plan Note (Addendum)
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 After reviewing the pathology in detail, we proceeded to discuss the different treatment options between surgery, radiation, chemotherapy, antiestrogen therapies.  Treatment plan: 1.  Breast conserving surgery with sentinel lymph node biopsy 2. Oncotype DX to determine if she would benefit from chemotherapy 3.  Adjuvant radiation therapy 4.  Followed by adjuvant antiestrogen therapy with complete estrogen blockade  Oncotype counseling: I discussed Oncotype DX test. I explained to the patient that this is a 21 gene panel to evaluate patient tumors DNA to calculate recurrence score. This would help determine whether patient has high risk or intermediate risk or low risk breast cancer. She understands that if her tumor was found to be high risk, she would benefit from systemic chemotherapy. If low risk, no need of chemotherapy. If she was found to be intermediate risk, we would need to evaluate the score as well as other risk factors and determine if an abbreviated chemotherapy may be of benefit.  Alternate treatment: If Dr. Marlou Starks wants [place] to shrink the size of the cancer, we will consider doing neoadjuvant therapy.  For that we will request for Oncotype to be done on the biopsy. If she is high risk, we will do neoadjuvant chemo If she is low risk then we will do neoadjuvant antiestrogen therapy.   Today's treatment: I sent a prescription for tamoxifen that she will start today.  She is very anxious to get surgery done as soon as possible.  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  She is accompanied by her mother today.  Fertility discussion: If she were to need chemotherapy then I will refer her to [facility] fertility clinic for egg harvesting.  Alternately we can consider Zoladex injections through her treatment.  We will call her with results of Oncotype.
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
## 2021-06-03 ENCOUNTER — Telehealth: Payer: Self-pay | Admitting: *Deleted

## 2021-06-03 NOTE — Telephone Encounter (Signed)
Ordered oncotype (core) per Dr. Lindi Adie. Faxed requisition to [facility] and [facility].

## 2021-06-04 ENCOUNTER — Other Ambulatory Visit: Payer: Self-pay | Admitting: *Deleted

## 2021-06-04 ENCOUNTER — Encounter: Payer: Self-pay | Admitting: *Deleted

## 2021-06-04 ENCOUNTER — Telehealth: Payer: Self-pay | Admitting: *Deleted

## 2021-06-04 DIAGNOSIS — Z170 Estrogen receptor positive status [ER+]: Secondary | ICD-10-CM

## 2021-06-04 DIAGNOSIS — C50211 Malignant neoplasm of upper-inner quadrant of right female breast: Secondary | ICD-10-CM

## 2021-06-04 DIAGNOSIS — C50411 Malignant neoplasm of upper-outer quadrant of right female breast: Secondary | ICD-10-CM | POA: Diagnosis not present

## 2021-06-04 NOTE — Telephone Encounter (Signed)
Spoke with patient to discuss navigation resources and give contact information. Patient denies any questions or concerns at this time.  Encouraged her to call should anything arise.Patient verbalized understanding.

## 2021-06-10 ENCOUNTER — Ambulatory Visit: Payer: 59 | Attending: Hematology and Oncology | Admitting: Physical Therapy

## 2021-06-10 ENCOUNTER — Encounter: Payer: Self-pay | Admitting: Physical Therapy

## 2021-06-10 ENCOUNTER — Other Ambulatory Visit: Payer: Self-pay

## 2021-06-10 DIAGNOSIS — C50211 Malignant neoplasm of upper-inner quadrant of right female breast: Secondary | ICD-10-CM | POA: Diagnosis not present

## 2021-06-10 DIAGNOSIS — Z170 Estrogen receptor positive status [ER+]: Secondary | ICD-10-CM | POA: Diagnosis not present

## 2021-06-10 DIAGNOSIS — R293 Abnormal posture: Secondary | ICD-10-CM | POA: Diagnosis not present

## 2021-06-10 NOTE — Therapy (Signed)
[place], [place], [address] Phone: [phone]   Fax:  [phone]  Physical Therapy Evaluation  Patient Details  Name: Amy Pittman MRN: [id number] Date of Birth: [birth year] Referring Provider (PT): Dr. Nicholas Lose   Encounter Date: 06/10/2021   PT End of Session - 06/10/21 1636     Visit Number 1    Number of Visits 2    Date for PT Re-Evaluation 12/11/21    PT Start Time 1500    PT Stop Time 1600    PT Time Calculation (min) 60 min    Activity Tolerance Patient tolerated treatment well    Behavior During Therapy [facility] for tasks assessed/performed             Past Medical History:  Diagnosis Date   Generalized headaches    Generalized seizures [facility]) neurologist--  per pt followed by [place] neurologist   per pt dx 2010 and per pt none since ,did have work-up negative , felt she was going to sleep Abscent seizure  and triggers are stress and exhaustion   Menorrhagia    Wears glasses     Past Surgical History:  Procedure Laterality Date   ANKLE ARTHROSCOPY Right 07/09/2005     dr Mayer Camel  [facility]   w/ Debridement and removal loose body   BIOPSY  12/01/2020   Procedure: BIOPSY;  Surgeon: Jackquline Denmark, MD;  Location: [place];  Service: Endoscopy;;   CHOLECYSTECTOMY N/A 12/02/2020   Procedure: LAPAROSCOPIC CHOLECYSTECTOMY WITH CHOLANGIOGRAM;  Surgeon: Erroll Luna, MD;  Location: [place];  Service: General;  Laterality: N/A;   [place] N/A 11/24/2017   Procedure: DILATATION & CURETTAGE/HYSTEROSCOPY;  Surgeon: Bobbye Charleston, MD;  Location: [place];  Service: Gynecology;  Laterality: N/A;   ERCP N/A 12/01/2020   Procedure: ENDOSCOPIC RETROGRADE CHOLANGIOPANCREATOGRAPHY (ERCP);  Surgeon: Jackquline Denmark, MD;  Location: [facility] ENDOSCOPY;  Service: Endoscopy;  Laterality: N/A;   REMOVAL OF STONES  12/01/2020   Procedure:  REMOVAL OF STONES;  Surgeon: Jackquline Denmark, MD;  Location: [facility] ENDOSCOPY;  Service: Endoscopy;;   SPHINCTEROTOMY  12/01/2020   Procedure: Joan Mayans;  Surgeon: Jackquline Denmark, MD;  Location: [facility] ENDOSCOPY;  Service: Endoscopy;;    There were no vitals filed for this visit.    Subjective Assessment - 06/10/21 1507     Subjective Patient is here today at the [place] clinic to be assessed for her newly diagnosed right breast cancer.    Pertinent History Patient reports she was diagnosed on 05/27/2021 with right grade III invasive ductal carcinoma breast cancer. It measures 4.6 cm and is located in the upper inner quadrant. It is ER/PR positive and HER2 negative with a Ki67 of 60%. She has a hx of a seizure disorder but no seizures since 2010.    Currently in Pain? No/denies   Intermittent right breast pain; none present today               [facility] PT Assessment - 06/10/21 0001       Assessment   Medical Diagnosis Right breast cancer    Referring Provider (PT) Dr. Nicholas Lose    Onset Date/Surgical Date 05/27/21    Hand Dominance Right    Prior Therapy none      Precautions   Precautions Other (comment)    Precaution Comments active cancer      Restrictions   Weight Bearing Restrictions No      Balance Screen  Has the patient fallen in the past 6 months No    Has the patient had a decrease in activity level because of a fear of falling?  No    Is the patient reluctant to leave their home because of a fear of falling?  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    Leisure She exercises with a trainer 3x/week      Cognition   Overall Cognitive Status Within Functional Limits for tasks assessed      Posture/Postural Control    Posture/Postural Control Postural limitations    Postural Limitations Rounded Shoulders;Forward head      ROM / Strength   AROM / PROM / Strength AROM;Strength      AROM   AROM Assessment Site Shoulder    Right/Left Shoulder Right;Left    Right Shoulder Extension 50 Degrees    Right Shoulder Flexion 155 Degrees    Right Shoulder ABduction 164 Degrees    Right Shoulder Internal Rotation 77 Degrees    Right Shoulder External Rotation 87 Degrees    Left Shoulder Extension 65 Degrees    Left Shoulder Flexion 160 Degrees    Left Shoulder ABduction 163 Degrees    Left Shoulder Internal Rotation 80 Degrees    Left Shoulder External Rotation 90 Degrees               LYMPHEDEMA/ONCOLOGY QUESTIONNAIRE - 06/10/21 0001       Type   Cancer Type Right breast cancer      Lymphedema Assessments   Lymphedema Assessments Upper extremities      Right Upper Extremity Lymphedema   10 cm Proximal to Olecranon Process 30.7 cm    Olecranon Process 25.7 cm    10 cm Proximal to Ulnar Styloid Process 21.6 cm    Just Proximal to Ulnar Styloid Process 16.1 cm    Across Hand at [employer] 19.2 cm    At [place] of 2nd Digit 6.4 cm      Left Upper Extremity Lymphedema   10 cm Proximal to Olecranon Process 31.3 cm    Olecranon Process 25.6 cm    10 cm Proximal to Ulnar Styloid Process 22 cm    Just Proximal to Ulnar Styloid Process 16.2 cm    Across Hand at [employer] 19.1 cm    At [place] of 2nd Digit 6.4 cm             L-DEX FLOWSHEETS - 06/10/21 1500       L-DEX LYMPHEDEMA SCREENING   Measurement Type Unilateral    L-DEX MEASUREMENT EXTREMITY Upper Extremity    POSITION  Standing    DOMINANT SIDE Right    At Risk Side Right    BASELINE SCORE (UNILATERAL) -2.4             The patient was assessed using the L-Dex machine today to produce a lymphedema index baseline score. The patient will be reassessed on a regular basis (typically every 3 months) to obtain new L-Dex  scores. If the score is > 6.5 points away from his/her baseline score indicating onset of subclinical lymphedema, it will be recommended to wear a compression garment for  4 weeks, 12 hours per day and then be reassessed. If the score continues to be > 6.5 points from baseline at reassessment, we will initiate lymphedema treatment. Assessing in this manner has a 95% rate of preventing clinically significant lymphedema.      Katina Dung - 06/10/21 0001     Open a tight or new jar No difficulty    Do heavy household chores (wash walls, wash floors) No difficulty    Carry a shopping bag or briefcase No difficulty    Wash your back No difficulty    Use a knife to cut food No difficulty    Recreational activities in which you take some force or impact through your arm, shoulder, or hand (golf, hammering, tennis) No difficulty    During the past week, to what extent has your arm, shoulder or hand problem interfered with your normal social activities with family, friends, neighbors, or groups? Not at all    During the past week, to what extent has your arm, shoulder or hand problem limited your work or other regular daily activities Not at all    Arm, shoulder, or hand pain. None    Tingling (pins and needles) in your arm, shoulder, or hand Mild    Difficulty Sleeping No difficulty    DASH Score 2.27 %              Objective measurements completed on examination: See above findings.         Patient was instructed today in a home exercise program today for post op shoulder range of motion. These included active assist shoulder flexion in sitting, scapular retraction, wall walking with shoulder abduction, and hands behind head external rotation.  She was encouraged to do these twice a day, holding 3 seconds and repeating 5 times when permitted by her physician.        PT Education - 06/10/21 1636     Education Details Lymphedema risk reduction and post op shoulder ROM HEP     Person(s) Educated Patient;Parent(s)    Methods Explanation;Demonstration;Handout    Comprehension Returned demonstration;Verbalized understanding                 PT Long Term Goals - 06/10/21 1639       PT LONG TERM GOAL #1   Title Patient will demonstrate she has regainde full shoulder ROM and function post operatively compared to baselines.    Time 6    Period Months    Status New    Target Date 12/11/21             Breast Clinic Goals - 06/10/21 1639       Patient will be able to verbalize understanding of pertinent lymphedema risk reduction practices relevant to her diagnosis specifically related to skin care.   Time 1    Period Days    Status Achieved      Patient will be able to return demonstrate and/or verbalize understanding of the post-op home exercise program related to regaining shoulder range of motion.   Time 1    Period Days    Status Achieved      Patient will be able to verbalize understanding of the importance of attending the postoperative After Breast Cancer Class for further lymphedema risk reduction education and therapeutic exercise.   Time 1    Period Days    Status Achieved  Plan - 06/10/21 1637     Clinical Impression Statement Patient reports she was diagnosed on 05/27/2021 with right grade III invasive ductal carcinoma breast cancer. It measures 4.6 cm and is located in the upper inner quadrant. It is ER/PR positive and HER2 negative with a Ki67 of 60%. She has a hx of a seizure disorder but no seizures since 2010. She is awaiting Oncotype results on her core biopsy to determine if she needs neoadjuvant chemotherapy. She will likely undergo a right lumpectomy and sentinel node biopsy followed by radiation and anti-estrogen therapy. She will benefit from a post op PT reassessment to determine needs and from L-Dex screens every 3 months for 2 years to detect subclinical lymphedema.    Stability/Clinical Decision  Making Stable/Uncomplicated    Clinical Decision Making Low    Rehab Potential Excellent    PT Frequency --   Eval and 1 f/u visit   PT Treatment/Interventions ADLs/Self Care Home Management;Therapeutic exercise;Patient/family education    PT Next Visit Plan Will reassess 3-4 weeks post op    PT Home Exercise Plan Post op shoulder ROM HEP    Consulted and Agree with Plan of Care Patient;Family member/caregiver    Family Member Consulted mother             Patient will benefit from skilled therapeutic intervention in order to improve the following deficits and impairments:  Postural dysfunction, Decreased range of motion, Decreased knowledge of precautions, Impaired UE functional use, Pain  Visit Diagnosis: Malignant neoplasm of upper-inner quadrant of right breast in female, estrogen receptor positive ([place]) - Plan: PT plan of care cert/re-cert  Abnormal posture - Plan: PT plan of care cert/re-cert  Patient will follow up at outpatient cancer rehab 3-4 weeks following surgery.  If the patient requires physical therapy at that time, a specific plan will be dictated and sent to the referring physician for approval. The patient was educated today on appropriate basic range of motion exercises to begin post operatively and the importance of attending the After Breast Cancer class following surgery.  Patient was educated today on lymphedema risk reduction practices as it pertains to recommendations that will benefit the patient immediately following surgery.  She verbalized good understanding.      Problem List Patient Active Problem List   Diagnosis Date Noted   Malignant neoplasm of upper-inner quadrant of right breast in female, estrogen receptor positive ([place]) 06/02/2021   RUQ abdominal pain    Gallstones    Choledocholithiasis    Pancreatitis 11/29/2020   Cellulitis of left upper eyelid 10/11/2018   Low back pain 05/28/2015   Awareness alteration, transient 03/07/2015   Routine  health maintenance 05/10/2012   Generalized seizures ([place])    Irregular menses    Annia Friendly, [place] 06/10/21 4:41 PM   [place], [place], [address] Phone: [phone]   Fax:  [phone]  Name: Amy Pittman MRN: [id number] Date of Birth: November 22, 1986

## 2021-06-10 NOTE — Patient Instructions (Signed)

## 2021-06-12 DIAGNOSIS — Z170 Estrogen receptor positive status [ER+]: Secondary | ICD-10-CM | POA: Diagnosis not present

## 2021-06-12 DIAGNOSIS — C50211 Malignant neoplasm of upper-inner quadrant of right female breast: Secondary | ICD-10-CM | POA: Diagnosis not present

## 2021-06-15 ENCOUNTER — Telehealth: Payer: Self-pay | Admitting: *Deleted

## 2021-06-15 NOTE — Telephone Encounter (Signed)
Received oncotype score of 30. Physician team notified. Called pt, left vm with appt to see Dr. Lindi Adie on 7/12 at 4pm. Request return call if appt does not work with her scheduled. Contact information provided.

## 2021-06-15 NOTE — Progress Notes (Signed)
Patient Care Team: Hoyt Koch, MD as PCP - General (Internal Medicine) Frederik Pear, MD (Orthopedic Surgery) Rockwell Germany, RN as Oncology Nurse Navigator Mauro Kaufmann, RN as Oncology Nurse Navigator  DIAGNOSIS:    ICD-10-CM   1. Malignant neoplasm of upper-inner quadrant of right breast in female, estrogen receptor positive ([place])  C50.211 CBC with Differential ([place] Only)   Z17.0 CMP ([place] only)    lidocaine-prilocaine (EMLA) cream    ondansetron (ZOFRAN) 8 MG tablet    dexamethasone (DECADRON) 4 MG tablet    prochlorperazine (COMPAZINE) 10 MG tablet      SUMMARY OF ONCOLOGIC HISTORY: Oncology History  Malignant neoplasm of upper-inner quadrant of right breast in female, estrogen receptor positive ([place])  06/02/2021 Initial Diagnosis   Malignant neoplasm of upper-inner quadrant of right breast in female, estrogen receptor positive ([place])    06/02/2021 Cancer Staging   Staging form: Breast, AJCC 8th Edition - Clinical: Stage IIA (cT2, cN0, cM0, G3, ER+, PR+, HER2-) - Signed by Nicholas Lose, MD on 06/02/2021  Stage prefix: Initial diagnosis  Histologic grading system: 3 grade system    06/09/2021 Oncotype testing   Oncotype score 30   06/25/2021 -  Chemotherapy    Patient is on Treatment Plan: BREAST TC Q21D         CHIEF COMPLIANT: Follow-up of right breast cancer  INTERVAL HISTORY: Amy Pittman is a 35 y.o. with above-mentioned history of right breast cancer, currently on antiestrogen therapy with tamoxifen. she is here today to discuss results of Oncotype DX testing.  She started tamoxifen and appears to be tolerating it well.  However because she is high risk by Oncotype score, she will stop tamoxifen and start neoadjuvant chemotherapy with Taxotere and Cytoxan.  ALLERGIES:  has No Known Allergies.  MEDICATIONS:  Current Outpatient Medications  Medication Sig Dispense Refill   acetaminophen (TYLENOL) 500 MG tablet  TAKE 2 TABLETS (1,000 MG TOTAL) BY MOUTH EVERY SIX HOURS AS NEEDED FOR MILD PAIN OR HEADACHE. 30 tablet 0   dexamethasone (DECADRON) 4 MG tablet Take 1 tablet (4 mg total) by mouth 2 (two) times daily. Take 1 tablet day before chemo and 1 tablet day after chemo with food 30 tablet 1   levonorgestrel (MIRENA, 52 MG,) 20 MCG/24HR IUD 1 Intra Uterine Device by Intrauterine route once. Every 7 years     lidocaine-prilocaine (EMLA) cream Apply to affected area once 30 g 3   naproxen sodium (ALEVE) 220 MG tablet Take 220 mg by mouth daily as needed (headache).     ondansetron (ZOFRAN) 8 MG tablet Take 1 tablet (8 mg total) by mouth 2 (two) times daily as needed for refractory nausea / vomiting. Start on day 3 after chemo. 30 tablet 1   prochlorperazine (COMPAZINE) 10 MG tablet Take 1 tablet (10 mg total) by mouth every 6 (six) hours as needed (Nausea or vomiting). 30 tablet 1   tamoxifen (NOLVADEX) 20 MG tablet Take 1 tablet (20 mg total) by mouth daily. 30 tablet 0   No current facility-administered medications for this visit.    PHYSICAL EXAMINATION: ECOG PERFORMANCE STATUS: 1 - Symptomatic but completely ambulatory  Vitals:   06/16/21 1605  BP: 117/71  Pulse: 81  Resp: 19  Temp: 97.6 F (36.4 C)  SpO2: 98%   Filed Weights   06/16/21 1605  Weight: 201 lb 8 oz (91.4 kg)      LABORATORY DATA:  I have reviewed the data as listed  CMP Latest Ref Rng & Units 12/02/2020 12/01/2020 11/30/2020  Glucose 70 - 99 mg/dL 126(H) 78 118(H)  BUN 6 - 20 mg/dL 6 7 7   Creatinine 0.44 - 1.00 mg/dL 0.76 0.82 0.86  Sodium 135 - 145 mmol/L 140 138 139  Potassium 3.5 - 5.1 mmol/L 3.8 3.4(L) 3.9  Chloride 98 - 111 mmol/L 107 106 108  CO2 22 - 32 mmol/L 24 21(L) 24  Calcium 8.9 - 10.3 mg/dL 8.6(L) 8.3(L) 8.3(L)  Total Protein 6.5 - 8.1 g/dL 5.7(L) 5.5(L) 5.8(L)  Total Bilirubin 0.3 - 1.2 mg/dL 0.6 1.5(H) 3.0(H)  Alkaline Phos 38 - 126 U/L 148(H) 175(H) 139(H)  AST 15 - 41 U/L 43(H) 126(H) 288(H)  ALT 0  - 44 U/L 251(H) 385(H) 544(H)    Lab Results  Component Value Date   WBC 13.0 (H) 12/02/2020   HGB 11.6 (L) 12/02/2020   HCT 33.9 (L) 12/02/2020   MCV 89.9 12/02/2020   PLT 291 12/02/2020   NEUTROABS 13.0 (H) 11/29/2020    ASSESSMENT & PLAN:  Malignant neoplasm of upper-inner quadrant of right breast in female, estrogen receptor positive ([place]) Breast MRI 06/01/2021: 4.6 cm spiculated mass in the upper inner quadrant right breast, no abnormal lymph nodes   Right breast biopsy 05/27/2021: Grade 3 IDC with DCIS ER 35%, PR 80%, Ki-67 60%, HER2 1+ negative, T2 N0 stage IIA Oncotype score: 30: High risk  Treatment plan: 1.  Neoadjuvant chemotherapy with Taxotere and Cytoxan every 3 weeks x4  2. followed by breast conserving surgery and sentinel lymph node biopsy 3.  Adjuvant radiation 4. Followed by adjuvant antiestrogen therapy with complete estrogen blockade -------------------------------------------------------------- Plan: Port placement, echo, chemo class URCC nausea study Return to clinic to start chemo.  Chemo counseling:Chemotherapy Counseling: I discussed the risks and benefits of chemotherapy including the risks of nausea/ vomiting, risk of infection from low WBC count, fatigue due to chemo or anemia, bruising or bleeding due to low platelets, mouth sores, loss/ change in taste and decreased appetite. Liver and kidney function will be monitored through out chemotherapy as abnormalities in liver and kidney function may be a side effect of treatment.  Risk of her peripheral neuropathy from Taxotere was discussed. Risk of permanent bone marrow dysfunction and leukemia due to chemo were also discussed.  Fertility preservation: We discussed egg harvesting versus Zoladex injections. We will initiate Zoladex injections starting this week.  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    Orders Placed This Encounter  Procedures   CBC with Differential ([place] Only)     Standing Status:   Standing    Number of Occurrences:   20    Standing Expiration Date:   06/16/2022   CMP ([place] only)    Standing Status:   Standing    Number of Occurrences:   20    Standing Expiration Date:   06/16/2022    The patient has a good understanding of the overall plan. she agrees with it. she will call with any problems that may develop before the next visit here.  Total time spent: 45 mins including face to face time and time spent for planning, charting and coordination of care  Rulon Eisenmenger, MD, MPH 06/16/2021  I, Thana Ates, am acting as scribe for Dr. Nicholas Lose.  I have reviewed the above documentation for accuracy and completeness, and I agree with the above.

## 2021-06-16 ENCOUNTER — Inpatient Hospital Stay: Payer: 59 | Admitting: Emergency Medicine

## 2021-06-16 ENCOUNTER — Other Ambulatory Visit: Payer: Self-pay

## 2021-06-16 ENCOUNTER — Inpatient Hospital Stay: Payer: 59 | Attending: Hematology and Oncology | Admitting: Hematology and Oncology

## 2021-06-16 ENCOUNTER — Other Ambulatory Visit: Payer: Self-pay | Admitting: *Deleted

## 2021-06-16 VITALS — BP 117/71 | HR 81 | Temp 97.6°F | Resp 19 | Ht 65.0 in | Wt 201.5 lb

## 2021-06-16 DIAGNOSIS — Z79899 Other long term (current) drug therapy: Secondary | ICD-10-CM | POA: Insufficient documentation

## 2021-06-16 DIAGNOSIS — Z170 Estrogen receptor positive status [ER+]: Secondary | ICD-10-CM | POA: Insufficient documentation

## 2021-06-16 DIAGNOSIS — Z5111 Encounter for antineoplastic chemotherapy: Secondary | ICD-10-CM | POA: Diagnosis not present

## 2021-06-16 DIAGNOSIS — C50211 Malignant neoplasm of upper-inner quadrant of right female breast: Secondary | ICD-10-CM

## 2021-06-16 DIAGNOSIS — Z79810 Long term (current) use of selective estrogen receptor modulators (SERMs): Secondary | ICD-10-CM | POA: Insufficient documentation

## 2021-06-16 DIAGNOSIS — Z975 Presence of (intrauterine) contraceptive device: Secondary | ICD-10-CM | POA: Diagnosis not present

## 2021-06-16 DIAGNOSIS — Z5189 Encounter for other specified aftercare: Secondary | ICD-10-CM | POA: Insufficient documentation

## 2021-06-16 MED ORDER — PROCHLORPERAZINE MALEATE 10 MG PO TABS: 10.0000 mg | ORAL_TABLET | Freq: Four times a day (QID) | ORAL | 1 refills | Status: DC | PRN

## 2021-06-16 MED ORDER — LIDOCAINE-PRILOCAINE 2.5-2.5 % EX CREA: TOPICAL_CREAM | CUTANEOUS | 3 refills | Status: DC

## 2021-06-16 MED ORDER — ONDANSETRON HCL 8 MG PO TABS: 8.0000 mg | ORAL_TABLET | Freq: Two times a day (BID) | ORAL | 1 refills | Status: DC | PRN

## 2021-06-16 MED ORDER — DEXAMETHASONE 4 MG PO TABS: 4.0000 mg | ORAL_TABLET | Freq: Two times a day (BID) | ORAL | 1 refills | Status: DC

## 2021-06-16 NOTE — Assessment & Plan Note (Addendum)
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%, PR 80%, Ki-67 60%, HER2 1+ negative, T2 N0 stage IIA Oncotype score: 30: High risk  Treatment plan: 1.  Neoadjuvant chemotherapy with Taxotere and Cytoxan every 3 weeks x4  2. followed by breast conserving surgery and sentinel lymph node biopsy 3.  Adjuvant radiation 4. Followed by adjuvant antiestrogen therapy with complete estrogen blockade -------------------------------------------------------------- Plan: Port placement, echo, chemo class URCC nausea study Return to clinic to start chemo. 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

## 2021-06-16 NOTE — Research (Signed)
Trial:  [id number] - TREATMENT OF REFRACTORY NAUSEA  Patient Amy Pittman was identified by MD Lindi Adie as a potential candidate for the above listed study.  This Clinical Research Nurse met with [name], [id number], on 06/16/21 in a manner and location that ensures patient privacy to discuss participation in the above listed research study.  Patient is Accompanied by mother [name] .  A copy of the informed consent document and separate HIPAA Authorization was provided to the patient.  Patient reads, speaks, and understands [place].   Patient was provided with the business card of this Nurse and encouraged to contact the research team with any questions.  Approximately 20 minutes were spent with the patient reviewing the informed consent documents.  Patient was provided the option of taking informed consent documents home to review and was encouraged to review at their convenience with their support network, including other care providers. Patient took the consent documents home to review.  Will f/u with pt in next few days to determine interest.  Wells Guiles 'Ruben Im, RN, BSN Clinical Research Nurse I 06/16/21 5:01 PM

## 2021-06-16 NOTE — Progress Notes (Signed)
START ON PATHWAY REGIMEN - Breast     A cycle is every 21 days:     Docetaxel      Cyclophosphamide   **Always confirm dose/schedule in your pharmacy ordering system**  Patient Characteristics: Preoperative or Nonsurgical Candidate (Clinical Staging), Neoadjuvant Therapy followed by Surgery, Invasive Disease, Chemotherapy, HER2 Negative/Unknown/Equivocal, ER Positive Therapeutic Status: Preoperative or Nonsurgical Candidate (Clinical Staging) AJCC M Category: cM0 AJCC Grade: G2 Breast Surgical Plan: Neoadjuvant Therapy followed by Surgery ER Status: Positive (+) AJCC 8 Stage Grouping: IB HER2 Status: Negative (-) AJCC T Category: cT2 AJCC N Category: cN0 PR Status: Positive (+) Intent of Therapy: Curative Intent, Discussed with Patient

## 2021-06-17 ENCOUNTER — Telehealth: Payer: Self-pay | Admitting: Emergency Medicine

## 2021-06-17 ENCOUNTER — Encounter: Payer: Self-pay | Admitting: *Deleted

## 2021-06-17 ENCOUNTER — Telehealth: Payer: Self-pay | Admitting: *Deleted

## 2021-06-17 NOTE — Telephone Encounter (Signed)
Called pt with port placement with IR. Confirmed for 7/21 arrive at 12pm, NPO after 7am and needs a driver. Received verbal understanding.

## 2021-06-17 NOTE — Telephone Encounter (Signed)
[id number] - TREATMENT OF REFRACTORY NAUSEA  10:39am:  Called patient to inform her that she was found to be ineligible for this trial due to history of seizure disorder.  Patient verbalized understanding.  Clabe Seal Clinical Research Coordinator I  06/17/21  10:51 AM

## 2021-06-17 NOTE — Telephone Encounter (Signed)
DCP-001: Use of a Clinical Trial Screening Tool to Address Cancer Health Disparities in the [facility])  10:39am: Introduced this study to the patient over the phone.  Will approach patient in person at her next appointment in the clinic to review informed consent form for this study.  Patient agreed to this plan.  Clabe Seal Clinical Research Coordinator I  06/17/21  10:52 AM

## 2021-06-18 ENCOUNTER — Encounter: Payer: Self-pay | Admitting: Licensed Clinical Social Worker

## 2021-06-18 NOTE — Progress Notes (Signed)
[place] Work  Clinical Social Work was referred by new patient protocol for assessment of psychosocial needs.  Clinical Social Worker contacted patient by phone  to offer support and assess for needs.    Patient reports that "she is going to be fine" and has good support from her mom and boyfriend. She is worried about losing her hair from chemo, but otherwise feels she is handling everything.  Denied any resource needs at this time.  CSW reviewed support services available. Provided direct contact information and encouraged patient to call with any questions.     [place], [place] Worker [employer]

## 2021-06-19 ENCOUNTER — Inpatient Hospital Stay (HOSPITAL_BASED_OUTPATIENT_CLINIC_OR_DEPARTMENT_OTHER): Payer: 59

## 2021-06-19 ENCOUNTER — Telehealth: Payer: Self-pay | Admitting: Hematology and Oncology

## 2021-06-19 ENCOUNTER — Other Ambulatory Visit: Payer: Self-pay

## 2021-06-19 ENCOUNTER — Encounter: Payer: Self-pay | Admitting: *Deleted

## 2021-06-19 VITALS — BP 122/68 | HR 68 | Temp 98.2°F | Resp 18

## 2021-06-19 DIAGNOSIS — C50211 Malignant neoplasm of upper-inner quadrant of right female breast: Secondary | ICD-10-CM | POA: Diagnosis not present

## 2021-06-19 DIAGNOSIS — Z170 Estrogen receptor positive status [ER+]: Secondary | ICD-10-CM

## 2021-06-19 DIAGNOSIS — Z5189 Encounter for other specified aftercare: Secondary | ICD-10-CM | POA: Diagnosis not present

## 2021-06-19 DIAGNOSIS — Z79899 Other long term (current) drug therapy: Secondary | ICD-10-CM | POA: Diagnosis not present

## 2021-06-19 DIAGNOSIS — Z5111 Encounter for antineoplastic chemotherapy: Secondary | ICD-10-CM | POA: Diagnosis not present

## 2021-06-19 DIAGNOSIS — Z79810 Long term (current) use of selective estrogen receptor modulators (SERMs): Secondary | ICD-10-CM | POA: Diagnosis not present

## 2021-06-19 DIAGNOSIS — Z975 Presence of (intrauterine) contraceptive device: Secondary | ICD-10-CM | POA: Diagnosis not present

## 2021-06-19 MED ORDER — GOSERELIN ACETATE 3.6 MG ~~LOC~~ IMPL
DRUG_IMPLANT | SUBCUTANEOUS | Status: AC
  Filled 2021-06-19: qty 3.6

## 2021-06-19 MED ORDER — GOSERELIN ACETATE 3.6 MG ~~LOC~~ IMPL
3.6000 mg | DRUG_IMPLANT | Freq: Once | SUBCUTANEOUS | Status: AC
Start: 2021-06-19 — End: 2021-06-19
  Administered 2021-06-19: 3.6 mg via SUBCUTANEOUS

## 2021-06-19 NOTE — Telephone Encounter (Signed)
Scheduled appointment per 07/13 sch msg. Left message. 

## 2021-06-19 NOTE — Progress Notes (Signed)
Pharmacist Chemotherapy Monitoring - Initial Assessment    Anticipated start date: 06/26/21  The following has been reviewed per standard work regarding the patient's treatment regimen: The patient's diagnosis, treatment plan and drug doses, and organ/hematologic function Lab orders and baseline tests specific to treatment regimen  The treatment plan start date, drug sequencing, and pre-medications Prior authorization status  Patient's documented medication list, including drug-drug interaction screen and prescriptions for anti-emetics and supportive care specific to the treatment regimen The drug concentrations, fluid compatibility, administration routes, and timing of the medications to be used The patient's access for treatment and lifetime cumulative dose history, if applicable  The patient's medication allergies and previous infusion related reactions, if applicable   Changes made to treatment plan:  treatment plan date  Follow up needed:  Pending authorization for treatment  Watch inj appt sched (needs to be > 24 hrs after chemo).   Tora Kindred, RPH, 06/19/2021  3:17 PM

## 2021-06-23 ENCOUNTER — Inpatient Hospital Stay: Payer: 59

## 2021-06-23 ENCOUNTER — Other Ambulatory Visit: Payer: Self-pay

## 2021-06-24 ENCOUNTER — Other Ambulatory Visit: Payer: Self-pay | Admitting: Student

## 2021-06-24 NOTE — Progress Notes (Signed)
Patient Care Team: Hoyt Koch, MD as PCP - General (Internal Medicine) Frederik Pear, MD (Orthopedic Surgery) Rockwell Germany, RN as Oncology Nurse Navigator Mauro Kaufmann, RN as Oncology Nurse Navigator  DIAGNOSIS:    ICD-10-CM   1. Malignant neoplasm of upper-inner quadrant of right breast in female, estrogen receptor positive ([place])  C50.211    Z17.0       SUMMARY OF ONCOLOGIC HISTORY: Oncology History  Malignant neoplasm of upper-inner quadrant of right breast in female, estrogen receptor positive ([place])  06/02/2021 Initial Diagnosis   Malignant neoplasm of upper-inner quadrant of right breast in female, estrogen receptor positive ([place])    06/02/2021 Cancer Staging   Staging form: Breast, AJCC 8th Edition - Clinical: Stage IIA (cT2, cN0, cM0, G3, ER+, PR+, HER2-) - Signed by Nicholas Lose, MD on 06/02/2021  Stage prefix: Initial diagnosis  Histologic grading system: 3 grade system    06/09/2021 Oncotype testing   Oncotype score 30   06/26/2021 -  Chemotherapy    Patient is on Treatment Plan: BREAST TC Q21D         CHIEF COMPLIANT: Cycle 1 TC start 06/26/2021  INTERVAL HISTORY: Amy Pittman is a 35 y.o. with above-mentioned history of right breast cancer, previously on antiestrogen therapy with tamoxifen which was stopped to Oncotype score showing high risk. She is to start chemotherapy with Taxotere and Cytoxan. She reports to the clinic for cycle 1.  She is here accompanied by her mother.  ALLERGIES:  has No Known Allergies.  MEDICATIONS:  Current Outpatient Medications  Medication Sig Dispense Refill   acetaminophen (TYLENOL) 500 MG tablet TAKE 2 TABLETS (1,000 MG TOTAL) BY MOUTH EVERY SIX HOURS AS NEEDED FOR MILD PAIN OR HEADACHE. 30 tablet 0   dexamethasone (DECADRON) 4 MG tablet Take 1 tablet (4 mg total) by mouth 2 (two) times daily. Take 1 tablet day before chemo and 1 tablet day after chemo with food 30 tablet 1   levonorgestrel  (MIRENA, 52 MG,) 20 MCG/24HR IUD 1 Intra Uterine Device by Intrauterine route once. Every 7 years     lidocaine-prilocaine (EMLA) cream Apply to affected area once 30 g 3   naproxen sodium (ALEVE) 220 MG tablet Take 220 mg by mouth daily as needed (headache).     ondansetron (ZOFRAN) 8 MG tablet Take 1 tablet (8 mg total) by mouth 2 (two) times daily as needed for refractory nausea / vomiting. Start on day 3 after chemo. 30 tablet 1   prochlorperazine (COMPAZINE) 10 MG tablet Take 1 tablet (10 mg total) by mouth every 6 (six) hours as needed (Nausea or vomiting). 30 tablet 1   tamoxifen (NOLVADEX) 20 MG tablet Take 1 tablet (20 mg total) by mouth daily. 30 tablet 0   No current facility-administered medications for this visit.    PHYSICAL EXAMINATION: ECOG PERFORMANCE STATUS: 1 - Symptomatic but completely ambulatory  Vitals:   06/25/21 1041  BP: 116/69  Pulse: 73  Resp: 18  Temp: (!) 97.5 F (36.4 C)  SpO2: 98%   Filed Weights   06/25/21 1041  Weight: 199 lb 8 oz (90.5 kg)    LABORATORY DATA:  I have reviewed the data as listed CMP Latest Ref Rng & Units 12/02/2020 12/01/2020 11/30/2020  Glucose 70 - 99 mg/dL 126(H) 78 118(H)  BUN 6 - 20 mg/dL _0 Creatinine 0.44 - 1.00 mg/dL 0.76 0.82 0.86  Sodium 135 - 145 mmol/L 140 138 139  Potassium 3.5 -  5.1 mmol/L 3.8 3.4(L) 3.9  Chloride 98 - 111 mmol/L 107 106 108  CO2 22 - 32 mmol/L 24 21(L) 24  Calcium 8.9 - 10.3 mg/dL 8.6(L) 8.3(L) 8.3(L)  Total Protein 6.5 - 8.1 g/dL 5.7(L) 5.5(L) 5.8(L)  Total Bilirubin 0.3 - 1.2 mg/dL 0.6 1.5(H) 3.0(H)  Alkaline Phos 38 - 126 U/L 148(H) 175(H) 139(H)  AST 15 - 41 U/L 43(H) 126(H) 288(H)  ALT 0 - 44 U/L 251(H) 385(H) 544(H)    Lab Results  Component Value Date   WBC 7.2 06/25/2021   HGB 13.4 06/25/2021   HCT 39.8 06/25/2021   MCV 88.4 06/25/2021   PLT 369 06/25/2021   NEUTROABS 4.7 06/25/2021    ASSESSMENT & PLAN:  Malignant neoplasm of upper-inner quadrant of right breast in  female, estrogen receptor positive ([place]) Breast MRI 06/01/2021: 4.6 cm spiculated mass in the upper inner quadrant right breast, no abnormal lymph nodes   Right breast biopsy 05/27/2021: 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  Neoadjuvant chemotherapy with Taxotere and Cytoxan every 3 weeks x4  2. followed by breast conserving surgery and sentinel lymph node biopsy 3.  Adjuvant radiation 4. Followed by adjuvant antiestrogen therapy with complete estrogen blockade Fertility preservation: Zoladex injections monthly started 06/19/2021 URCC nausea study -------------------------------------------------------------- Current treatment: Cycle 1 day 1 Taxotere and Cytoxan start 06/26/2021 Port is going to be placed today.  Labs reviewed, chemo education completed, chemo consent obtained  Return to clinic in 1 week for toxicity check    No orders of the defined types were placed in this encounter.  The patient has a good understanding of the overall plan. she agrees with it. she will call with any problems that may develop before the next visit here.  Total time spent: 30 mins including face to face time and time spent for planning, charting and coordination of care  Rulon Eisenmenger, MD, MPH 06/25/2021  I, Thana Ates, am acting as scribe for Dr. Nicholas Lose.  I have reviewed the above documentation for accuracy and completeness, and I agree with the above.

## 2021-06-25 ENCOUNTER — Encounter (HOSPITAL_COMMUNITY): Payer: Self-pay

## 2021-06-25 ENCOUNTER — Inpatient Hospital Stay: Payer: 59

## 2021-06-25 ENCOUNTER — Ambulatory Visit (HOSPITAL_COMMUNITY)
Admission: RE | Admit: 2021-06-25 | Discharge: 2021-06-25 | Disposition: A | Discharge status: Home / Self Care | Payer: 59 | Source: Ambulatory Visit | Attending: Hematology and Oncology | Admitting: Hematology and Oncology

## 2021-06-25 ENCOUNTER — Telehealth: Payer: Self-pay

## 2021-06-25 ENCOUNTER — Inpatient Hospital Stay: Payer: 59 | Admitting: Hematology and Oncology

## 2021-06-25 ENCOUNTER — Other Ambulatory Visit: Payer: Self-pay

## 2021-06-25 ENCOUNTER — Other Ambulatory Visit: Payer: Self-pay | Admitting: Hematology and Oncology

## 2021-06-25 DIAGNOSIS — Z79899 Other long term (current) drug therapy: Secondary | ICD-10-CM | POA: Insufficient documentation

## 2021-06-25 DIAGNOSIS — Z975 Presence of (intrauterine) contraceptive device: Secondary | ICD-10-CM | POA: Diagnosis not present

## 2021-06-25 DIAGNOSIS — C50211 Malignant neoplasm of upper-inner quadrant of right female breast: Secondary | ICD-10-CM

## 2021-06-25 DIAGNOSIS — Z5111 Encounter for antineoplastic chemotherapy: Secondary | ICD-10-CM | POA: Diagnosis not present

## 2021-06-25 DIAGNOSIS — Z452 Encounter for adjustment and management of vascular access device: Secondary | ICD-10-CM | POA: Diagnosis not present

## 2021-06-25 DIAGNOSIS — Z170 Estrogen receptor positive status [ER+]: Secondary | ICD-10-CM

## 2021-06-25 DIAGNOSIS — Z5189 Encounter for other specified aftercare: Secondary | ICD-10-CM | POA: Diagnosis not present

## 2021-06-25 DIAGNOSIS — C50911 Malignant neoplasm of unspecified site of right female breast: Secondary | ICD-10-CM | POA: Insufficient documentation

## 2021-06-25 DIAGNOSIS — Z79810 Long term (current) use of selective estrogen receptor modulators (SERMs): Secondary | ICD-10-CM | POA: Diagnosis not present

## 2021-06-25 HISTORY — DX: Malignant (primary) neoplasm, unspecified: C80.1

## 2021-06-25 HISTORY — PX: IR IMAGING GUIDED PORT INSERTION: IMG5740

## 2021-06-25 LAB — CBC WITH DIFFERENTIAL (CANCER CENTER ONLY)
Abs Immature Granulocytes: 0.01 10*3/uL (ref 0.00–0.07)
Basophils Absolute: 0 10*3/uL (ref 0.0–0.1)
Basophils Relative: 0 %
Eosinophils Absolute: 0.1 10*3/uL (ref 0.0–0.5)
Eosinophils Relative: 1 %
HCT: 39.8 % (ref 36.0–46.0)
Hemoglobin: 13.4 g/dL (ref 12.0–15.0)
Immature Granulocytes: 0 %
Lymphocytes Relative: 26 %
Lymphs Abs: 1.9 10*3/uL (ref 0.7–4.0)
MCH: 29.8 pg (ref 26.0–34.0)
MCHC: 33.7 g/dL (ref 30.0–36.0)
MCV: 88.4 fL (ref 80.0–100.0)
Monocytes Absolute: 0.5 10*3/uL (ref 0.1–1.0)
Monocytes Relative: 8 %
Neutro Abs: 4.7 10*3/uL (ref 1.7–7.7)
Neutrophils Relative %: 65 %
Platelet Count: 369 10*3/uL (ref 150–400)
RBC: 4.5 MIL/uL (ref 3.87–5.11)
RDW: 12.7 % (ref 11.5–15.5)
WBC Count: 7.2 10*3/uL (ref 4.0–10.5)
nRBC: 0 % (ref 0.0–0.2)

## 2021-06-25 LAB — CMP (CANCER CENTER ONLY)
ALT: 21 U/L (ref 0–44)
AST: 18 U/L (ref 15–41)
Albumin: 4.5 g/dL (ref 3.5–5.0)
Alkaline Phosphatase: 41 U/L (ref 38–126)
Anion gap: 7 (ref 5–15)
BUN: 13 mg/dL (ref 6–20)
CO2: 26 mmol/L (ref 22–32)
Calcium: 9.2 mg/dL (ref 8.9–10.3)
Chloride: 107 mmol/L (ref 98–111)
Creatinine: 0.84 mg/dL (ref 0.44–1.00)
GFR, Estimated: >60 mL/min (ref >60)
Glucose, Bld: 97 mg/dL (ref 70–99)
Potassium: 4.4 mmol/L (ref 3.5–5.1)
Sodium: 140 mmol/L (ref 135–145)
Total Bilirubin: 0.7 mg/dL (ref 0.3–1.2)
Total Protein: 7.8 g/dL (ref 6.5–8.1)

## 2021-06-25 MED ORDER — FENTANYL CITRATE (PF) 100 MCG/2ML IJ SOLN
INTRAMUSCULAR | Status: AC | PRN
  Administered 2021-06-25: 50 ug via INTRAVENOUS
  Administered 2021-06-25 (×2): 25 ug via INTRAVENOUS

## 2021-06-25 MED ORDER — SODIUM CHLORIDE 0.9 % IV SOLN: INTRAVENOUS | Status: DC

## 2021-06-25 MED ORDER — HEPARIN SOD (PORK) LOCK FLUSH 100 UNIT/ML IV SOLN
INTRAVENOUS | Status: AC | PRN
  Administered 2021-06-25: 500 [IU]

## 2021-06-25 MED ORDER — HEPARIN SOD (PORK) LOCK FLUSH 100 UNIT/ML IV SOLN
INTRAVENOUS | Status: AC
  Filled 2021-06-25: qty 5

## 2021-06-25 MED ORDER — MIDAZOLAM HCL 2 MG/2ML IJ SOLN
INTRAMUSCULAR | Status: AC
  Filled 2021-06-25: qty 2

## 2021-06-25 MED ORDER — LIDOCAINE HCL 1 % IJ SOLN: INTRAMUSCULAR | Status: AC | PRN

## 2021-06-25 MED ORDER — MIDAZOLAM HCL 2 MG/2ML IJ SOLN
INTRAMUSCULAR | Status: AC | PRN
Start: 2021-06-25 — End: 2021-06-25
  Administered 2021-06-25: 0.5 mg via INTRAVENOUS
  Administered 2021-06-25: 1 mg via INTRAVENOUS
  Administered 2021-06-25: 0.5 mg via INTRAVENOUS

## 2021-06-25 MED ORDER — LIDOCAINE-EPINEPHRINE 1 %-1:100000 IJ SOLN
INTRAMUSCULAR | Status: AC
  Filled 2021-06-25: qty 1

## 2021-06-25 MED ORDER — LIDOCAINE-EPINEPHRINE 1 %-1:100000 IJ SOLN
INTRAMUSCULAR | Status: AC | PRN
  Administered 2021-06-25: 5 mg

## 2021-06-25 MED ORDER — FENTANYL CITRATE (PF) 100 MCG/2ML IJ SOLN
INTRAMUSCULAR | Status: AC
  Filled 2021-06-25: qty 2
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
## 2021-06-25 NOTE — Procedures (Signed)
Vascular and Interventional Radiology Procedure Note  Patient: Amy Pittman DOB: 10-31-1986 Medical Record Number: [id number] Note Date/Time: 06/25/21 2:51 PM   Performing Physician: Michaelle Birks, MD Assistant(s): None  Diagnosis: Breast cancer  Procedure: PORT PLACEMENT  Anesthesia: Conscious Sedation Complications: None Estimated Blood Loss: Minimal  Findings:  Successful left-sided port placement, with the tip of the catheter in the proximal right atrium.  Plan: Catheter ready for use.  See detailed procedure note with images in PACS. The patient tolerated the procedure well without incident or complication and was returned to Recovery in stable condition.    Michaelle Birks, MD [facility] Rad[facility]   Clinic: [phone]

## 2021-06-25 NOTE — Assessment & Plan Note (Signed)
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  Neoadjuvant chemotherapy with Taxotere and Cytoxan every 3 weeks x4  2. followed by breast conserving surgery and sentinel lymph node biopsy 3.  Adjuvant radiation 4. Followed by adjuvant antiestrogen therapy with complete estrogen blockade Fertility preservation: Zoladex injections monthly started 06/19/2021 URCC nausea study -------------------------------------------------------------- Current treatment: Cycle 1 day 1 Taxotere and Cytoxan Labs reviewed, chemo education completed, chemo consent obtained  Return to clinic in 1 week for toxicity check

## 2021-06-25 NOTE — Discharge Instructions (Signed)
Interventional radiology phone numbers °[phone] °After hours [phone] ° ° ° °You have skin glue (dermabond) over your new port. Do not use the lidocaine cream (EMLA cream) over the skin glue until it has healed. The petroleum in the lidocaine cream will dissolve the skin glue resulting in an infection of your new port. Use ice in a zip lock bag for 1-2 minutes over your new port before the cancer center nurses access your port. ° ° °Implanted Port Insertion, Care After °This sheet gives you information about how to care for yourself after your procedure. Your health care provider may also give you more specific instructions. If you have problems or questions, contact your health care provider. °What can I expect after the procedure? °After the procedure, it is common to have: °Discomfort at the port insertion site. °Bruising on the skin over the port. This should improve over 3-4 days. °Follow these instructions at home: °Port care °After your port is placed, you will get a manufacturer's information card. The card has information about your port. Keep this card with you at all times. °Take care of the port as told by your health care provider. Ask your health care provider if you or a family member can get training for taking care of the port at home. A home health care nurse may also take care of the port. °Make sure to remember what type of port you have. °Incision care °Follow instructions from your health care provider about how to take care of your port insertion site. Make sure you: °Wash your hands with soap and water before and after you change your bandage (dressing). If soap and water are not available, use hand sanitizer. °Change your dressing as told by your health care provider. °Leave skin glue in place. These skin closures may need to stay in place for 2 weeks or longer.  °Check your port insertion site every day for signs of infection. Check for: °Redness, swelling, or pain. °Fluid or  blood. °Warmth. °Pus or a bad smell.  °  °  °Activity °Return to your normal activities as told by your health care provider. Ask your health care provider what activities are safe for you. °Do not lift anything that is heavier than 10 lb (4.5 kg), or the limit that you are told, until your health care provider says that it is safe. °General instructions °Take over-the-counter and prescription medicines only as told by your health care provider. °Do not take baths, swim, or use a hot tub until your health care provider approves.You may remove your dressing tomorrow and shower 24 hours after your procedure. °Do not drive for 24 hours if you were given a sedative during your procedure. °Wear a medical alert bracelet in case of an emergency. This will tell any health care providers that you have a port. °Keep all follow-up visits as told by your health care provider. This is important. °Contact a health care provider if: °You cannot flush your port with saline as directed, or you cannot draw blood from the port. °You have a fever or chills. °You have redness, swelling, or pain around your port insertion site. °You have fluid or blood coming from your port insertion site. °Your port insertion site feels warm to the touch. °You have pus or a bad smell coming from the port insertion site. °Get help right away if: °You have chest pain or shortness of breath. °You have bleeding from your port that you cannot control. °Summary °Take care of   the port as told by your health care provider. Keep the manufacturer's information card with you at all times. °Change your dressing as told by your health care provider. °Contact a health care provider if you have a fever or chills or if you have redness, swelling, or pain around your port insertion site. °Keep all follow-up visits as told by your health care provider. °This information is not intended to replace advice given to you by your health care provider. Make sure you discuss any  questions you have with your health care provider. °Document Revised: 06/20/2018 Document Reviewed: 06/20/2018 °Elsevier Patient Education © 2021 Elsevier Inc. ° ° ° °Moderate Conscious Sedation, Adult, Care After °This sheet gives you information about how to care for yourself after your procedure. Your health care provider may also give you more specific instructions. If you have problems or questions, contact your health care provider. °What can I expect after the procedure? °After the procedure, it is common to have: °Sleepiness for several hours. °Impaired judgment for several hours. °Difficulty with balance. °Vomiting if you eat too soon. °Follow these instructions at home: °For the time period you were told by your health care provider: °Rest. °Do not participate in activities where you could fall or become injured. °Do not drive or use machinery. °Do not drink alcohol. °Do not take sleeping pills or medicines that cause drowsiness. °Do not make important decisions or sign legal documents. °Do not take care of children on your own.  °  °  °Eating and drinking °Follow the diet recommended by your health care provider. °Drink enough fluid to keep your urine pale yellow. °If you vomit: °Drink water, juice, or soup when you can drink without vomiting. °Make sure you have little or no nausea before eating solid foods.   °General instructions °Take over-the-counter and prescription medicines only as told by your health care provider. °Have a responsible adult stay with you for the time you are told. It is important to have someone help care for you until you are awake and alert. °Do not smoke. °Keep all follow-up visits as told by your health care provider. This is important. °Contact a health care provider if: °You are still sleepy or having trouble with balance after 24 hours. °You feel light-headed. °You keep feeling nauseous or you keep vomiting. °You develop a rash. °You have a fever. °You have redness or  swelling around the IV site. °Get help right away if: °You have trouble breathing. °You have new-onset confusion at home. °Summary °After the procedure, it is common to feel sleepy, have impaired judgment, or feel nauseous if you eat too soon. °Rest after you get home. Know the things you should not do after the procedure. °Follow the diet recommended by your health care provider and drink enough fluid to keep your urine pale yellow. °Get help right away if you have trouble breathing or new-onset confusion at home. °This information is not intended to replace advice given to you by your health care provider. Make sure you discuss any questions you have with your health care provider. °Document Revised: 03/21/2020 Document Reviewed: 10/18/2019 °Elsevier Patient Education © 2021 Elsevier Inc.  °

## 2021-06-25 NOTE — Telephone Encounter (Signed)
Notified Patient of completion of FMLA paperwork and fax transmission confirmation received  from [employer]. Copy mailed to patient as requested

## 2021-06-26 ENCOUNTER — Inpatient Hospital Stay: Payer: 59 | Admitting: Emergency Medicine

## 2021-06-26 ENCOUNTER — Encounter: Payer: Self-pay | Admitting: *Deleted

## 2021-06-26 ENCOUNTER — Inpatient Hospital Stay: Payer: 59

## 2021-06-26 VITALS — BP 127/79 | HR 65 | Temp 98.1°F | Resp 16 | Ht 65.0 in | Wt 199.4 lb

## 2021-06-26 DIAGNOSIS — C50211 Malignant neoplasm of upper-inner quadrant of right female breast: Secondary | ICD-10-CM | POA: Diagnosis not present

## 2021-06-26 DIAGNOSIS — Z170 Estrogen receptor positive status [ER+]: Secondary | ICD-10-CM

## 2021-06-26 DIAGNOSIS — Z5189 Encounter for other specified aftercare: Secondary | ICD-10-CM | POA: Diagnosis not present

## 2021-06-26 DIAGNOSIS — Z5111 Encounter for antineoplastic chemotherapy: Secondary | ICD-10-CM | POA: Diagnosis not present

## 2021-06-26 DIAGNOSIS — Z79899 Other long term (current) drug therapy: Secondary | ICD-10-CM | POA: Diagnosis not present

## 2021-06-26 DIAGNOSIS — Z79810 Long term (current) use of selective estrogen receptor modulators (SERMs): Secondary | ICD-10-CM | POA: Diagnosis not present

## 2021-06-26 DIAGNOSIS — Z975 Presence of (intrauterine) contraceptive device: Secondary | ICD-10-CM | POA: Diagnosis not present

## 2021-06-26 MED ORDER — COLD PACK MISC ONCOLOGY
1.0000 | Freq: Once | Status: DC | PRN
  Filled 2021-06-26: qty 1

## 2021-06-26 MED ORDER — SODIUM CHLORIDE 0.9% FLUSH
10.0000 mL | INTRAVENOUS | Status: DC | PRN
  Administered 2021-06-26: 10 mL
  Filled 2021-06-26: qty 10

## 2021-06-26 MED ORDER — SODIUM CHLORIDE 0.9 % IV SOLN
Freq: Once | INTRAVENOUS | Status: AC
  Filled 2021-06-26: qty 250

## 2021-06-26 MED ORDER — SODIUM CHLORIDE 0.9 % IV SOLN
10.0000 mg | Freq: Once | INTRAVENOUS | Status: AC
  Administered 2021-06-26: 10 mg via INTRAVENOUS
  Filled 2021-06-26: qty 10

## 2021-06-26 MED ORDER — HEPARIN SOD (PORK) LOCK FLUSH 100 UNIT/ML IV SOLN
500.0000 [IU] | Freq: Once | INTRAVENOUS | Status: AC | PRN
  Administered 2021-06-26: 500 [IU]
  Filled 2021-06-26: qty 5

## 2021-06-26 MED ORDER — SODIUM CHLORIDE 0.9 % IV SOLN
75.0000 mg/m2 | Freq: Once | INTRAVENOUS | Status: AC
  Administered 2021-06-26: 150 mg via INTRAVENOUS
  Filled 2021-06-26: qty 15

## 2021-06-26 MED ORDER — PALONOSETRON HCL INJECTION 0.25 MG/5ML
INTRAVENOUS | Status: AC
  Filled 2021-06-26: qty 5

## 2021-06-26 MED ORDER — SODIUM CHLORIDE 0.9 % IV SOLN
600.0000 mg/m2 | Freq: Once | INTRAVENOUS | Status: AC
  Administered 2021-06-26: 1240 mg via INTRAVENOUS
  Filled 2021-06-26: qty 62

## 2021-06-26 MED ORDER — SODIUM CHLORIDE 0.9 % IV SOLN
150.0000 mg | Freq: Once | INTRAVENOUS | Status: AC
  Administered 2021-06-26: 150 mg via INTRAVENOUS
  Filled 2021-06-26: qty 150

## 2021-06-26 MED ORDER — PALONOSETRON HCL INJECTION 0.25 MG/5ML
0.2500 mg | Freq: Once | INTRAVENOUS | Status: AC
  Administered 2021-06-26: 0.25 mg via INTRAVENOUS

## 2021-06-26 NOTE — Patient Instructions (Signed)
[facility]  Discharge Instructions: Thank you for choosing [facility] to provide your oncology and hematology care.   If you have a lab appointment with the [facility], please go directly to the [facility] and check in at the registration area.   Wear comfortable clothing and clothing appropriate for easy access to any Portacath or PICC line.   We strive to give you quality time with your provider. You may need to reschedule your appointment if you arrive late (15 or more minutes).  Arriving late affects you and other patients whose appointments are after yours.  Also, if you miss three or more appointments without notifying the office, you may be dismissed from the clinic at the provider's discretion.      For prescription refill requests, have your pharmacy contact our office and allow 72 hours for refills to be completed.    Today you received the following chemotherapy and/or immunotherapy agents Docetaxel and Cytoxan      To help prevent nausea and vomiting after your treatment, we encourage you to take your nausea medication as directed.  BELOW ARE SYMPTOMS THAT SHOULD BE REPORTED IMMEDIATELY: *FEVER GREATER THAN 100.4 F (38 C) OR HIGHER *CHILLS OR SWEATING *NAUSEA AND VOMITING THAT IS NOT CONTROLLED WITH YOUR NAUSEA MEDICATION *UNUSUAL SHORTNESS OF BREATH *UNUSUAL BRUISING OR BLEEDING *URINARY PROBLEMS (pain or burning when urinating, or frequent urination) *BOWEL PROBLEMS (unusual diarrhea, constipation, pain near the anus) TENDERNESS IN MOUTH AND THROAT WITH OR WITHOUT PRESENCE OF ULCERS (sore throat, sores in mouth, or a toothache) UNUSUAL RASH, SWELLING OR PAIN  UNUSUAL VAGINAL DISCHARGE OR ITCHING   Items with * indicate a potential emergency and should be followed up as soon as possible or go to the Emergency Department if any problems should occur.  Please show the CHEMOTHERAPY ALERT CARD or IMMUNOTHERAPY ALERT CARD at  check-in to the Emergency Department and triage nurse.  Should you have questions after your visit or need to cancel or reschedule your appointment, please contact [facility]  Dept: [phone]  and follow the prompts.  Office hours are 8:00 a.m. to 4:30 p.m. Monday - Friday. Please note that voicemails left after 4:00 p.m. may not be returned until the following business day.  We are closed weekends and major holidays. You have access to a nurse at all times for urgent questions. Please call the main number to the clinic Dept: [phone] and follow the prompts.   For any non-urgent questions, you may also contact your provider using MyChart. We now offer e-Visits for anyone 59 and older to request care online for non-urgent symptoms. For details visit [email or web address].   Also download the MyChart app! Go to the app store, search "MyChart", open the app, select [facility], and log in with your MyChart username and password.  Due to Covid, a mask is required upon entering the hospital/clinic. If you do not have a mask, one will be given to you upon arrival. For doctor visits, patients may have 1 support person aged 37 or older with them. For treatment visits, patients cannot have anyone with them due to current Covid guidelines and our immunocompromised population.  Cyclophosphamide Injection What is this medication? CYCLOPHOSPHAMIDE (sye kloe FOSS fa mide) is a chemotherapy drug. It slows the growth of cancer cells. This medicine is used to treat many types of cancer like lymphoma, myeloma, leukemia, breast cancer, and ovarian cancer, to name afew. This medicine may  be used for other purposes; ask your health care provider orpharmacist if you have questions. COMMON BRAND NAME(S): Cytoxan, Neosar What should I tell my care team before I take this medication? They need to know if you have any of these conditions: heart disease history of irregular  heartbeat infection kidney disease liver disease low blood counts, like white cells, platelets, or red blood cells on hemodialysis recent or ongoing radiation therapy scarring or thickening of the lungs trouble passing urine an unusual or allergic reaction to cyclophosphamide, other medicines, foods, dyes, or preservatives pregnant or trying to get pregnant breast-feeding How should I use this medication? This drug is usually given as an injection into a vein or muscle or by infusion into a vein. It is administered in a hospital or clinic by a specially trainedhealth care professional. Talk to your pediatrician regarding the use of this medicine in children.Special care may be needed. Overdosage: If you think you have taken too much of this medicine contact apoison control center or emergency room at once. NOTE: This medicine is only for you. Do not share this medicine with others. What if I miss a dose? It is important not to miss your dose. Call your doctor or health careprofessional if you are unable to keep an appointment. What may interact with this medication? amphotericin B azathioprine certain antivirals for HIV or hepatitis certain medicines for blood pressure, heart disease, irregular heart beat certain medicines that treat or prevent blood clots like warfarin certain other medicines for cancer cyclosporine etanercept indomethacin medicines that relax muscles for surgery medicines to increase blood counts metronidazole This list may not describe all possible interactions. Give your health care provider a list of all the medicines, herbs, non-prescription drugs, or dietary supplements you use. Also tell them if you smoke, drink alcohol, or use illegaldrugs. Some items may interact with your medicine. What should I watch for while using this medication? Your condition will be monitored carefully while you are receiving thismedicine. You may need blood work done while you  are taking this medicine. Drink water or other fluids as directed. Urinate often, even at night. Some products may contain alcohol. Ask your health care professional if this medicine contains alcohol. Be sure to tell all health care professionals you are taking this medicine. Certain medicines, like metronidazole and disulfiram, can cause an unpleasant reaction when taken with alcohol. The reaction includes flushing, headache, nausea, vomiting, sweating, and increased thirst. Thereaction can last from 30 minutes to several hours. Do not become pregnant while taking this medicine or for 1 year after stopping it. Women should inform their health care professional if they wish to become pregnant or think they might be pregnant. Men should not father a child while taking this medicine and for 4 months after stopping it. There is potential for serious side effects to an unborn child. Talk to your health care professionalfor more information. Do not breast-feed an infant while taking this medicine or for 1 week afterstopping it. This medicine has caused ovarian failure in some women. This medicine may make it more difficult to get pregnant. Talk to your health care professional if [place] concerned about your fertility. This medicine has caused decreased sperm counts in some men. This may make it more difficult to father a child. Talk to your health care professional if [place] concerned about your fertility. Call your health care professional for advice if you get a fever, chills, or sore throat, or other symptoms of a cold or flu.  Do not treat yourself. This medicine decreases your body's ability to fight infections. Try to avoid beingaround people who are sick. Avoid taking medicines that contain aspirin, acetaminophen, ibuprofen, naproxen, or ketoprofen unless instructed by your health care professional.These medicines may hide a fever. Talk to your health care professional about your risk of cancer. You may  bemore at risk for certain types of cancer if you take this medicine. If you are going to need surgery or other procedure, tell your health careprofessional that you are using this medicine. Be careful brushing or flossing your teeth or using a toothpick because you may get an infection or bleed more easily. If you have any dental work done, Primary school teacher you are receiving this medicine. What side effects may I notice from receiving this medication? Side effects that you should report to your doctor or health care professionalas soon as possible: allergic reactions like skin rash, itching or hives, swelling of the face, lips, or tongue breathing problems nausea, vomiting signs and symptoms of bleeding such as bloody or black, tarry stools; red or dark brown urine; spitting up blood or brown material that looks like coffee grounds; red spots on the skin; unusual bruising or bleeding from the eyes, gums, or nose signs and symptoms of heart failure like fast, irregular heartbeat, sudden weight gain; swelling of the ankles, feet, hands signs and symptoms of infection like fever; chills; cough; sore throat; pain or trouble passing urine signs and symptoms of kidney injury like trouble passing urine or change in the amount of urine signs and symptoms of liver injury like dark yellow or brown urine; general ill feeling or flu-like symptoms; light-colored stools; loss of appetite; nausea; right upper belly pain; unusually weak or tired; yellowing of the eyes or skin Side effects that usually do not require medical attention (report to yourdoctor or health care professional if they continue or are bothersome): confusion decreased hearing diarrhea facial flushing hair loss headache loss of appetite missed menstrual periods signs and symptoms of low red blood cells or anemia such as unusually weak or tired; feeling faint or lightheaded; falls skin discoloration This list may not describe all possible  side effects. Call your doctor for medical advice about side effects. You may report side effects to FDA at1-800-FDA-1088. Where should I keep my medication? This drug is given in a hospital or clinic and will not be stored at home. NOTE: This sheet is a summary. It may not cover all possible information. If you have questions about this medicine, talk to your doctor, pharmacist, orhealth care provider.  2022 Elsevier/Gold Standard (2019-08-27 09:53:29) Docetaxel injection What is this medication? DOCETAXEL (doe se TAX el) is a chemotherapy drug. It targets fast dividing cells, like cancer cells, and causes these cells to die. This medicine is used to treat many types of cancers like breast cancer, certain stomach cancers,head and neck cancer, lung cancer, and prostate cancer. This medicine may be used for other purposes; ask your health care provider orpharmacist if you have questions. COMMON BRAND NAME(S): Docefrez, Taxotere What should I tell my care team before I take this medication? They need to know if you have any of these conditions: infection (especially a virus infection such as chickenpox, cold sores, or herpes) liver disease low blood counts, like low white cell, platelet, or red cell counts an unusual or allergic reaction to docetaxel, polysorbate 80, other chemotherapy agents, other medicines, foods, dyes, or preservatives pregnant or trying to get pregnant breast-feeding How should I  use this medication? This drug is given as an infusion into a vein. It is administered in a hospitalor clinic by a specially trained health care professional. Talk to your pediatrician regarding the use of this medicine in children.Special care may be needed. Overdosage: If you think you have taken too much of this medicine contact apoison control center or emergency room at once. NOTE: This medicine is only for you. Do not share this medicine with others. What if I miss a dose? It is important  not to miss your dose. Call your doctor or health careprofessional if you are unable to keep an appointment. What may interact with this medication? Do not take this medicine with any of the following medications: live virus vaccines This medicine may also interact with the following medications: aprepitant certain antibiotics like erythromycin or clarithromycin certain antivirals for HIV or hepatitis certain medicines for fungal infections like fluconazole, itraconazole, ketoconazole, posaconazole, or voriconazole cimetidine ciprofloxacin conivaptan cyclosporine dronedarone fluvoxamine grapefruit juice imatinib verapamil This list may not describe all possible interactions. Give your health care provider a list of all the medicines, herbs, non-prescription drugs, or dietary supplements you use. Also tell them if you smoke, drink alcohol, or use illegaldrugs. Some items may interact with your medicine. What should I watch for while using this medication? Your condition will be monitored carefully while you are receiving this medicine. You will need important blood work done while you are taking thismedicine. Call your doctor or health care professional for advice if you get a fever, chills or sore throat, or other symptoms of a cold or flu. Do not treat yourself. This drug decreases your body's ability to fight infections. Try toavoid being around people who are sick. Some products may contain alcohol. Ask your health care professional if this medicine contains alcohol. Be sure to tell all health care professionals you are taking this medicine. Certain medicines, like metronidazole and disulfiram, can cause an unpleasant reaction when taken with alcohol. The reaction includes flushing, headache, nausea, vomiting, sweating, and increased thirst. Thereaction can last from 30 minutes to several hours. You may get drowsy or dizzy. Do not drive, use machinery, or do anything that needs mental  alertness until you know how this medicine affects you. Do not stand or sit up quickly, especially if you are an older patient. This reduces the risk of dizzy or fainting spells. Alcohol may interfere with the effect ofthis medicine. Talk to your health care professional about your risk of cancer. You may bemore at risk for certain types of cancer if you take this medicine. Do not become pregnant while taking this medicine or for 6 months after stopping it. Women should inform their doctor if they wish to become pregnant or think they might be pregnant. There is a potential for serious side effects to an unborn child. Talk to your health care professional or pharmacist for more information. Do not breast-feed an infant while taking this medicine orfor 1 week after stopping it. Males who get this medicine must use a condom during sex with females who can get pregnant. If you get a woman pregnant, the baby could have birth defects. The baby could die before they are born. You will need to continue wearing a condom for 3 months after stopping the medicine. Tell your health care providerright away if your partner becomes pregnant while you are taking this medicine. This may interfere with the ability to father a child. You should talk to yourdoctor or health care  professional if you are concerned about your fertility. What side effects may I notice from receiving this medication? Side effects that you should report to your doctor or health care professionalas soon as possible: allergic reactions like skin rash, itching or hives, swelling of the face, lips, or tongue blurred vision breathing problems changes in vision low blood counts - This drug may decrease the number of white blood cells, red blood cells and platelets. You may be at increased risk for infections and bleeding. nausea and vomiting pain, redness or irritation at site where injected pain, tingling, numbness in the hands or feet redness,  blistering, peeling, or loosening of the skin, including inside the mouth signs of decreased platelets or bleeding - bruising, pinpoint red spots on the skin, black, tarry stools, nosebleeds signs of decreased red blood cells - unusually weak or tired, fainting spells, lightheadedness signs of infection - fever or chills, cough, sore throat, pain or difficulty passing urine swelling of the ankle, feet, hands Side effects that usually do not require medical attention (report to yourdoctor or health care professional if they continue or are bothersome): constipation diarrhea fingernail or toenail changes hair loss loss of appetite mouth sores muscle pain This list may not describe all possible side effects. Call your doctor for medical advice about side effects. You may report side effects to FDA at1-800-FDA-1088. Where should I keep my medication? This drug is given in a hospital or clinic and will not be stored at home. NOTE: This sheet is a summary. It may not cover all possible information. If you have questions about this medicine, talk to your doctor, pharmacist, orhealth care provider.  2022 Elsevier/Gold Standard (2019-10-22 19:50:31)

## 2021-06-26 NOTE — Research (Signed)
Trial:  [id number]-001: Use of a Clinical Trial Screening Tool to Address Cancer Health Disparities in the [place] Program [facility]) Patient Amy Pittman was identified by Clinical Research Coordinator Clabe Seal as a potential candidate for the above listed study.  This Clinical Research Nurse met with Amy Pittman, [id number], on 06/26/21 in a manner and location that ensures patient privacy to discuss participation in the above listed research study.  Patient is Unaccompanied.  A copy of the informed consent document and separate HIPAA Authorization was provided to the patient.  Patient reads, speaks, and understands [place].   Patient was provided with the business card of this Nurse and encouraged to contact the research team with any questions.  Approximately 20 minutes were spent with the patient reviewing the informed consent documents.  Patient was provided the option of taking informed consent documents home to review and was encouraged to review at their convenience with their support network, including other care providers. Patient took the consent documents home to review.  Will f/u with patient at her upcoming appts to determine interest in enrollment.  Wells Guiles 'Sterling, RN, BSN Clinical Research Nurse I 06/26/21 9:22 AM

## 2021-06-26 NOTE — Progress Notes (Signed)
Per Dr. Lindi Adie ok to proceed with chemotherapy with ALT 251.

## 2021-06-27 ENCOUNTER — Inpatient Hospital Stay: Payer: 59

## 2021-06-29 ENCOUNTER — Inpatient Hospital Stay: Payer: 59

## 2021-06-29 ENCOUNTER — Encounter: Payer: Self-pay | Admitting: Hematology and Oncology

## 2021-06-29 ENCOUNTER — Encounter: Payer: Self-pay | Admitting: *Deleted

## 2021-06-29 ENCOUNTER — Other Ambulatory Visit: Payer: Self-pay

## 2021-06-29 VITALS — BP 117/83 | HR 86 | Temp 98.2°F | Resp 16

## 2021-06-29 DIAGNOSIS — Z975 Presence of (intrauterine) contraceptive device: Secondary | ICD-10-CM | POA: Diagnosis not present

## 2021-06-29 DIAGNOSIS — C50211 Malignant neoplasm of upper-inner quadrant of right female breast: Secondary | ICD-10-CM | POA: Diagnosis not present

## 2021-06-29 DIAGNOSIS — Z5189 Encounter for other specified aftercare: Secondary | ICD-10-CM | POA: Diagnosis not present

## 2021-06-29 DIAGNOSIS — Z170 Estrogen receptor positive status [ER+]: Secondary | ICD-10-CM

## 2021-06-29 DIAGNOSIS — Z5111 Encounter for antineoplastic chemotherapy: Secondary | ICD-10-CM | POA: Diagnosis not present

## 2021-06-29 DIAGNOSIS — Z79899 Other long term (current) drug therapy: Secondary | ICD-10-CM | POA: Diagnosis not present

## 2021-06-29 DIAGNOSIS — Z79810 Long term (current) use of selective estrogen receptor modulators (SERMs): Secondary | ICD-10-CM | POA: Diagnosis not present

## 2021-06-29 MED ORDER — PEGFILGRASTIM-CBQV 6 MG/0.6ML ~~LOC~~ SOSY
PREFILLED_SYRINGE | SUBCUTANEOUS | Status: AC
  Filled 2021-06-29: qty 0.6

## 2021-06-29 MED ORDER — PEGFILGRASTIM-BMEZ 6 MG/0.6ML ~~LOC~~ SOSY
6.0000 mg | PREFILLED_SYRINGE | Freq: Once | SUBCUTANEOUS | Status: AC
  Administered 2021-06-29: 6 mg via SUBCUTANEOUS

## 2021-06-29 MED ORDER — PEGFILGRASTIM-CBQV 6 MG/0.6ML ~~LOC~~ SOSY: 6.0000 mg | PREFILLED_SYRINGE | Freq: Once | SUBCUTANEOUS | Status: DC

## 2021-06-29 MED ORDER — PEGFILGRASTIM-BMEZ 6 MG/0.6ML ~~LOC~~ SOSY
PREFILLED_SYRINGE | SUBCUTANEOUS | Status: AC
  Filled 2021-06-29: qty 0.6

## 2021-06-29 NOTE — Progress Notes (Signed)
Met w/ pt to introduce myself as her Arboriculturist, discuss copay assistance and the [employer].  Pt gave me consent to apply in her behalf so I enroll her in the [employer] Complete program for [employer] for $15,000 for 12 months from 06/29/21.  Pt is eligible to have $0 OOP costs for each [employer].  I went over the [employer] and gave her the income requirement.  Pt exceeds the income requirement so she doesn't qualify for the [employer] grant at this time.  She has my card for any questions or concerns she may have in the future.

## 2021-06-29 NOTE — Patient Instructions (Addendum)

## 2021-06-30 ENCOUNTER — Encounter: Payer: Self-pay | Admitting: Hematology and Oncology

## 2021-06-30 ENCOUNTER — Other Ambulatory Visit: Payer: Self-pay | Admitting: Hematology and Oncology

## 2021-07-02 ENCOUNTER — Other Ambulatory Visit: Payer: Self-pay

## 2021-07-02 ENCOUNTER — Inpatient Hospital Stay: Payer: 59

## 2021-07-02 ENCOUNTER — Encounter: Payer: Self-pay | Admitting: Adult Health

## 2021-07-02 ENCOUNTER — Inpatient Hospital Stay: Payer: 59 | Admitting: Adult Health

## 2021-07-02 ENCOUNTER — Encounter: Payer: Self-pay | Admitting: *Deleted

## 2021-07-02 VITALS — BP 110/72 | HR 90 | Temp 97.7°F | Resp 20 | Wt 195.8 lb

## 2021-07-02 DIAGNOSIS — Z170 Estrogen receptor positive status [ER+]: Secondary | ICD-10-CM

## 2021-07-02 DIAGNOSIS — C50211 Malignant neoplasm of upper-inner quadrant of right female breast: Secondary | ICD-10-CM | POA: Diagnosis not present

## 2021-07-02 DIAGNOSIS — Z79810 Long term (current) use of selective estrogen receptor modulators (SERMs): Secondary | ICD-10-CM | POA: Diagnosis not present

## 2021-07-02 DIAGNOSIS — Z79899 Other long term (current) drug therapy: Secondary | ICD-10-CM | POA: Diagnosis not present

## 2021-07-02 DIAGNOSIS — Z5111 Encounter for antineoplastic chemotherapy: Secondary | ICD-10-CM | POA: Diagnosis not present

## 2021-07-02 DIAGNOSIS — Z5189 Encounter for other specified aftercare: Secondary | ICD-10-CM | POA: Diagnosis not present

## 2021-07-02 DIAGNOSIS — Z975 Presence of (intrauterine) contraceptive device: Secondary | ICD-10-CM | POA: Diagnosis not present

## 2021-07-02 LAB — CMP (CANCER CENTER ONLY)
ALT: 44 U/L (ref 0–44)
AST: 21 U/L (ref 15–41)
Albumin: 3.7 g/dL (ref 3.5–5.0)
Alkaline Phosphatase: 53 U/L (ref 38–126)
Anion gap: 10 (ref 5–15)
BUN: 6 mg/dL (ref 6–20)
CO2: 25 mmol/L (ref 22–32)
Calcium: 9.3 mg/dL (ref 8.9–10.3)
Chloride: 105 mmol/L (ref 98–111)
Creatinine: 0.74 mg/dL (ref 0.44–1.00)
GFR, Estimated: >60 mL/min (ref >60)
Glucose, Bld: 87 mg/dL (ref 70–99)
Potassium: 3.7 mmol/L (ref 3.5–5.1)
Sodium: 140 mmol/L (ref 135–145)
Total Bilirubin: 0.5 mg/dL (ref 0.3–1.2)
Total Protein: 7.2 g/dL (ref 6.5–8.1)

## 2021-07-02 LAB — CBC WITH DIFFERENTIAL (CANCER CENTER ONLY)
Abs Immature Granulocytes: 0.05 10*3/uL (ref 0.00–0.07)
Basophils Absolute: 0 10*3/uL (ref 0.0–0.1)
Basophils Relative: 1 %
Eosinophils Absolute: 0 10*3/uL (ref 0.0–0.5)
Eosinophils Relative: 2 %
HCT: 36.7 % (ref 36.0–46.0)
Hemoglobin: 12.5 g/dL (ref 12.0–15.0)
Immature Granulocytes: 4 %
Lymphocytes Relative: 64 %
Lymphs Abs: 0.9 10*3/uL (ref 0.7–4.0)
MCH: 29.9 pg (ref 26.0–34.0)
MCHC: 34.1 g/dL (ref 30.0–36.0)
MCV: 87.8 fL (ref 80.0–100.0)
Monocytes Absolute: 0.3 10*3/uL (ref 0.1–1.0)
Monocytes Relative: 22 %
Neutro Abs: 0.1 10*3/uL — CL (ref 1.7–7.7)
Neutrophils Relative %: 7 %
Platelet Count: 250 10*3/uL (ref 150–400)
RBC: 4.18 MIL/uL (ref 3.87–5.11)
RDW: 12.2 % (ref 11.5–15.5)
WBC Count: 1.4 10*3/uL — ABNORMAL LOW (ref 4.0–10.5)
nRBC: 0 % (ref 0.0–0.2)

## 2021-07-02 NOTE — Assessment & Plan Note (Addendum)
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  Neoadjuvant chemotherapy with Taxotere and Cytoxan every 3 weeks x4  2. followed by breast conserving surgery and sentinel lymph node biopsy 3.  Adjuvant radiation 4. Followed by adjuvant antiestrogen therapy with complete estrogen blockade Fertility preservation: Zoladex injections monthly started 06/19/2021 URCC nausea study -------------------------------------------------------------- Current treatment: Cycle 1 day 8 Taxotere and Cytoxan  Day 8 ANC 0.1.  I reviewed neutropenic precautions with patient.  I wrote her out of work for tonight, and the rest of the weekend if she isn't improving.    Her CMP is stable.  Her kidney function and potassium is normal.  I recommended that she continue with her hydration, as she is doing a good job with it thus far.  I let her know that her WBC should increase in the next couple of days and she should start to feel better.    Kennyth Lose will return in 2 weeks for labs, f/u, and her next treatment.
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
## 2021-07-15 NOTE — Progress Notes (Signed)
Patient Care Team: Hoyt Koch, MD as PCP - General (Internal Medicine) Frederik Pear, MD (Orthopedic Surgery) Rockwell Germany, RN as Oncology Nurse Navigator Mauro Kaufmann, RN as Oncology Nurse Navigator  DIAGNOSIS:    ICD-10-CM   1. Malignant neoplasm of upper-inner quadrant of right breast in female, estrogen receptor positive ([place])  C50.211    Z17.0       SUMMARY OF ONCOLOGIC HISTORY: Oncology History  Malignant neoplasm of upper-inner quadrant of right breast in female, estrogen receptor positive ([place])  06/02/2021 Initial Diagnosis   Malignant neoplasm of upper-inner quadrant of right breast in female, estrogen receptor positive ([place])   06/02/2021 Cancer Staging   Staging form: Breast, AJCC 8th Edition - Clinical: Stage IIA (cT2, cN0, cM0, G3, ER+, PR+, HER2-) - Signed by Nicholas Lose, MD on 06/02/2021 Stage prefix: Initial diagnosis Histologic grading system: 3 grade system   06/09/2021 Oncotype testing   Oncotype score 30   06/26/2021 -  Chemotherapy    Patient is on Treatment Plan: BREAST TC Q21D         CHIEF COMPLIANT: Cycle 2 TC  INTERVAL HISTORY: Amy Pittman is a 35 y.o. with above-mentioned history of right breast cancer, previously on antiestrogen therapy with tamoxifen which was stopped to Oncotype score showing high risk, currently on chemotherapy with Taxotere and Cytoxan. She reports to the clinic for cycle 2.  She tolerated cycle 1 fairly well.  She did not have any nausea or vomiting.  She lost her hair and is very sad about it.  She had diarrhea for couple of days which improved with Imodium.  ALLERGIES:  has No Known Allergies.  MEDICATIONS:  Current Outpatient Medications  Medication Sig Dispense Refill   acetaminophen (TYLENOL) 500 MG tablet TAKE 2 TABLETS (1,000 MG TOTAL) BY MOUTH EVERY SIX HOURS AS NEEDED FOR MILD PAIN OR HEADACHE. 30 tablet 0   dexamethasone (DECADRON) 4 MG tablet Take 1 tablet (4 mg total) by mouth 2  (two) times daily. Take 1 tablet day before chemo and 1 tablet day after chemo with food 30 tablet 1   levonorgestrel (MIRENA, 52 MG,) 20 MCG/24HR IUD 1 Intra Uterine Device by Intrauterine route once. Every 7 years     lidocaine-prilocaine (EMLA) cream Apply to affected area once 30 g 3   naproxen sodium (ALEVE) 220 MG tablet Take 220 mg by mouth daily as needed (headache).     ondansetron (ZOFRAN) 8 MG tablet Take 1 tablet (8 mg total) by mouth 2 (two) times daily as needed for refractory nausea / vomiting. Start on day 3 after chemo. 30 tablet 1   prochlorperazine (COMPAZINE) 10 MG tablet Take 1 tablet (10 mg total) by mouth every 6 (six) hours as needed (Nausea or vomiting). 30 tablet 1   tamoxifen (NOLVADEX) 20 MG tablet TAKE 1 TABLET BY MOUTH EVERY DAY 30 tablet 0   No current facility-administered medications for this visit.    PHYSICAL EXAMINATION: ECOG PERFORMANCE STATUS: 1 - Symptomatic but completely ambulatory  There were no vitals filed for this visit. There were no vitals filed for this visit.  LABORATORY DATA:  I have reviewed the data as listed CMP Latest Ref Rng & Units 07/02/2021 06/25/2021 12/02/2020  Glucose 70 - 99 mg/dL 87 97 126(H)  BUN 6 - 20 mg/dL 6 13 6   Creatinine 0.44 - 1.00 mg/dL 0.74 0.84 0.76  Sodium 135 - 145 mmol/L 140 140 140  Potassium 3.5 - 5.1 mmol/L 3.7 4.4 3.8  Chloride 98 - 111 mmol/L 105 107 107  CO2 22 - 32 mmol/L 25 26 24   Calcium 8.9 - 10.3 mg/dL 9.3 9.2 8.6(L)  Total Protein 6.5 - 8.1 g/dL 7.2 7.8 5.7(L)  Total Bilirubin 0.3 - 1.2 mg/dL 0.5 0.7 0.6  Alkaline Phos 38 - 126 U/L 53 41 148(H)  AST 15 - 41 U/L 21 18 43(H)  ALT 0 - 44 U/L 44 21 251(H)    Lab Results  Component Value Date   WBC 1.4 (L) 07/02/2021   HGB 12.5 07/02/2021   HCT 36.7 07/02/2021   MCV 87.8 07/02/2021   PLT 250 07/02/2021   NEUTROABS 0.1 (LL) 07/02/2021    ASSESSMENT & PLAN:  Malignant neoplasm of upper-inner quadrant of right breast in female, estrogen  receptor positive ([place]) Breast MRI 06/01/2021: 4.6 cm spiculated mass in the upper inner quadrant right breast, no abnormal lymph nodes   Right breast biopsy 05/27/2021: 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  Neoadjuvant chemotherapy with Taxotere and Cytoxan every 3 weeks x4  2. followed by breast conserving surgery and sentinel lymph node biopsy 3.  Adjuvant radiation 4. Followed by adjuvant antiestrogen therapy with complete estrogen blockade Fertility preservation: Zoladex injections monthly started 06/19/2021 URCC nausea study -------------------------------------------------------------- Current treatment: Cycle 2 Taxotere and Cytoxan started 06/26/2021  Chemo toxicities: Diarrhea for couple of days improved with Imodium Alopecia We reduced the dosage of for cycle 2 chemo because of nadir count being so low at 0.1 ANC.  She will receive the Zoladex injection today and Neulasta tomorrow. She did not experience any nausea or vomiting.  She felt tired for a week after chemo.  Return to clinic in 3 weeks for cycle 3    No orders of the defined types were placed in this encounter.  The patient has a good understanding of the overall plan. she agrees with it. she will call with any problems that may develop before the next visit here.  Total time spent: 30 mins including face to face time and time spent for planning, charting and coordination of care  Rulon Eisenmenger, MD, MPH 07/16/2021  I, Thana Ates, am acting as scribe for Dr. Nicholas Lose.  I have reviewed the above documentation for accuracy and completeness, and I agree with the above.

## 2021-07-16 ENCOUNTER — Inpatient Hospital Stay (HOSPITAL_BASED_OUTPATIENT_CLINIC_OR_DEPARTMENT_OTHER): Payer: 59 | Admitting: Hematology and Oncology

## 2021-07-16 ENCOUNTER — Inpatient Hospital Stay: Payer: 59 | Attending: Hematology and Oncology

## 2021-07-16 ENCOUNTER — Inpatient Hospital Stay: Payer: 59

## 2021-07-16 ENCOUNTER — Other Ambulatory Visit: Payer: Self-pay

## 2021-07-16 DIAGNOSIS — Z95828 Presence of other vascular implants and grafts: Secondary | ICD-10-CM

## 2021-07-16 DIAGNOSIS — C50211 Malignant neoplasm of upper-inner quadrant of right female breast: Secondary | ICD-10-CM

## 2021-07-16 DIAGNOSIS — Z5189 Encounter for other specified aftercare: Secondary | ICD-10-CM | POA: Diagnosis not present

## 2021-07-16 DIAGNOSIS — Z170 Estrogen receptor positive status [ER+]: Secondary | ICD-10-CM | POA: Diagnosis not present

## 2021-07-16 DIAGNOSIS — Z5111 Encounter for antineoplastic chemotherapy: Secondary | ICD-10-CM | POA: Insufficient documentation

## 2021-07-16 LAB — CBC WITH DIFFERENTIAL (CANCER CENTER ONLY)
Abs Immature Granulocytes: 0.04 10*3/uL (ref 0.00–0.07)
Basophils Absolute: 0 10*3/uL (ref 0.0–0.1)
Basophils Relative: 0 %
Eosinophils Absolute: 0 10*3/uL (ref 0.0–0.5)
Eosinophils Relative: 0 %
HCT: 33.1 % — ABNORMAL LOW (ref 36.0–46.0)
Hemoglobin: 11.3 g/dL — ABNORMAL LOW (ref 12.0–15.0)
Immature Granulocytes: 0 %
Lymphocytes Relative: 16 %
Lymphs Abs: 1.6 10*3/uL (ref 0.7–4.0)
MCH: 29.8 pg (ref 26.0–34.0)
MCHC: 34.1 g/dL (ref 30.0–36.0)
MCV: 87.3 fL (ref 80.0–100.0)
Monocytes Absolute: 0.7 10*3/uL (ref 0.1–1.0)
Monocytes Relative: 7 %
Neutro Abs: 8 10*3/uL — ABNORMAL HIGH (ref 1.7–7.7)
Neutrophils Relative %: 77 %
Platelet Count: 290 10*3/uL (ref 150–400)
RBC: 3.79 MIL/uL — ABNORMAL LOW (ref 3.87–5.11)
RDW: 13.2 % (ref 11.5–15.5)
WBC Count: 10.4 10*3/uL (ref 4.0–10.5)
nRBC: 0.2 % (ref 0.0–0.2)

## 2021-07-16 LAB — CMP (CANCER CENTER ONLY)
ALT: 22 U/L (ref 0–44)
AST: 14 U/L — ABNORMAL LOW (ref 15–41)
Albumin: 4 g/dL (ref 3.5–5.0)
Alkaline Phosphatase: 50 U/L (ref 38–126)
Anion gap: 11 (ref 5–15)
BUN: 14 mg/dL (ref 6–20)
CO2: 24 mmol/L (ref 22–32)
Calcium: 9.1 mg/dL (ref 8.9–10.3)
Chloride: 106 mmol/L (ref 98–111)
Creatinine: 0.67 mg/dL (ref 0.44–1.00)
GFR, Estimated: >60 mL/min (ref >60)
Glucose, Bld: 97 mg/dL (ref 70–99)
Potassium: 3.5 mmol/L (ref 3.5–5.1)
Sodium: 141 mmol/L (ref 135–145)
Total Bilirubin: 0.4 mg/dL (ref 0.3–1.2)
Total Protein: 7.2 g/dL (ref 6.5–8.1)

## 2021-07-16 MED ORDER — SODIUM CHLORIDE 0.9 % IV SOLN
10.0000 mg | Freq: Once | INTRAVENOUS | Status: AC
  Administered 2021-07-16: 10 mg via INTRAVENOUS
  Filled 2021-07-16: qty 10

## 2021-07-16 MED ORDER — SODIUM CHLORIDE 0.9 % IV SOLN
150.0000 mg | Freq: Once | INTRAVENOUS | Status: AC
  Administered 2021-07-16: 150 mg via INTRAVENOUS
  Filled 2021-07-16: qty 150

## 2021-07-16 MED ORDER — SODIUM CHLORIDE 0.9 % IV SOLN
500.0000 mg/m2 | Freq: Once | INTRAVENOUS | Status: AC
  Administered 2021-07-16: 1020 mg via INTRAVENOUS
  Filled 2021-07-16: qty 51

## 2021-07-16 MED ORDER — PALONOSETRON HCL INJECTION 0.25 MG/5ML
0.2500 mg | Freq: Once | INTRAVENOUS | Status: AC
  Administered 2021-07-16: 0.25 mg via INTRAVENOUS
  Filled 2021-07-16: qty 5

## 2021-07-16 MED ORDER — SODIUM CHLORIDE 0.9 % IV SOLN
Freq: Once | INTRAVENOUS | Status: AC
  Filled 2021-07-16: qty 250

## 2021-07-16 MED ORDER — HEPARIN SOD (PORK) LOCK FLUSH 100 UNIT/ML IV SOLN
500.0000 [IU] | Freq: Once | INTRAVENOUS | Status: AC | PRN
  Administered 2021-07-16: 500 [IU]
  Filled 2021-07-16: qty 5

## 2021-07-16 MED ORDER — GOSERELIN ACETATE 3.6 MG ~~LOC~~ IMPL
3.6000 mg | DRUG_IMPLANT | Freq: Once | SUBCUTANEOUS | Status: AC
  Administered 2021-07-16: 3.6 mg via SUBCUTANEOUS
  Filled 2021-07-16: qty 3.6

## 2021-07-16 MED ORDER — SODIUM CHLORIDE 0.9 % IV SOLN
65.0000 mg/m2 | Freq: Once | INTRAVENOUS | Status: AC
  Administered 2021-07-16: 130 mg via INTRAVENOUS
  Filled 2021-07-16: qty 13

## 2021-07-16 MED ORDER — SODIUM CHLORIDE 0.9% FLUSH
10.0000 mL | INTRAVENOUS | Status: DC | PRN
Start: 2021-07-16 — End: 2021-07-16
  Administered 2021-07-16: 10 mL
  Filled 2021-07-16: qty 10

## 2021-07-16 MED ORDER — SODIUM CHLORIDE 0.9% FLUSH
10.0000 mL | INTRAVENOUS | Status: AC | PRN
  Administered 2021-07-16: 10 mL
  Filled 2021-07-16: qty 10

## 2021-07-16 NOTE — Assessment & Plan Note (Signed)
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

## 2021-07-16 NOTE — Patient Instructions (Signed)
[facility] ONCOLOGY  Discharge Instructions: Thank you for choosing [facility] to provide your oncology and hematology care.   If you have a lab appointment with the [facility], please go directly to the [facility] and check in at the registration area.   Wear comfortable clothing and clothing appropriate for easy access to any Portacath or PICC line.   We strive to give you quality time with your provider. You may need to reschedule your appointment if you arrive late (15 or more minutes).  Arriving late affects you and other patients whose appointments are after yours.  Also, if you miss three or more appointments without notifying the office, you may be dismissed from the clinic at the provider's discretion.      For prescription refill requests, have your pharmacy contact our office and allow 72 hours for refills to be completed.    Today you received the following chemotherapy and/or immunotherapy agents Taxotere; cytoxin    To help prevent nausea and vomiting after your treatment, we encourage you to take your nausea medication as directed.  BELOW ARE SYMPTOMS THAT SHOULD BE REPORTED IMMEDIATELY: *FEVER GREATER THAN 100.4 F (38 C) OR HIGHER *CHILLS OR SWEATING *NAUSEA AND VOMITING THAT IS NOT CONTROLLED WITH YOUR NAUSEA MEDICATION *UNUSUAL SHORTNESS OF BREATH *UNUSUAL BRUISING OR BLEEDING *URINARY PROBLEMS (pain or burning when urinating, or frequent urination) *BOWEL PROBLEMS (unusual diarrhea, constipation, pain near the anus) TENDERNESS IN MOUTH AND THROAT WITH OR WITHOUT PRESENCE OF ULCERS (sore throat, sores in mouth, or a toothache) UNUSUAL RASH, SWELLING OR PAIN  UNUSUAL VAGINAL DISCHARGE OR ITCHING   Items with * indicate a potential emergency and should be followed up as soon as possible or go to the Emergency Department if any problems should occur.  Please show the CHEMOTHERAPY ALERT CARD or IMMUNOTHERAPY ALERT CARD at  check-in to the Emergency Department and triage nurse.  Should you have questions after your visit or need to cancel or reschedule your appointment, please contact [facility]  Dept: [phone]  and follow the prompts.  Office hours are 8:00 a.m. to 4:30 p.m. Monday - Friday. Please note that voicemails left after 4:00 p.m. may not be returned until the following business day.  We are closed weekends and major holidays. You have access to a nurse at all times for urgent questions. Please call the main number to the clinic Dept: [phone] and follow the prompts.   For any non-urgent questions, you may also contact your provider using MyChart. We now offer e-Visits for anyone 61 and older to request care online for non-urgent symptoms. For details visit [email or web address].   Also download the MyChart app! Go to the app store, search "MyChart", open the app, select [facility], and log in with your MyChart username and password.  Due to Covid, a mask is required upon entering the hospital/clinic. If you do not have a mask, one will be given to you upon arrival. For doctor visits, patients may have 1 support person aged 37 or older with them. For treatment visits, patients cannot have anyone with them due to current Covid guidelines and our immunocompromised population.

## 2021-07-17 ENCOUNTER — Inpatient Hospital Stay: Payer: 59

## 2021-07-17 ENCOUNTER — Ambulatory Visit: Payer: 59

## 2021-07-17 VITALS — BP 113/76 | HR 73 | Temp 98.6°F | Resp 18

## 2021-07-17 DIAGNOSIS — Z5111 Encounter for antineoplastic chemotherapy: Secondary | ICD-10-CM | POA: Diagnosis not present

## 2021-07-17 DIAGNOSIS — Z170 Estrogen receptor positive status [ER+]: Secondary | ICD-10-CM | POA: Diagnosis not present

## 2021-07-17 DIAGNOSIS — C50211 Malignant neoplasm of upper-inner quadrant of right female breast: Secondary | ICD-10-CM | POA: Diagnosis not present

## 2021-07-17 DIAGNOSIS — Z5189 Encounter for other specified aftercare: Secondary | ICD-10-CM | POA: Diagnosis not present

## 2021-07-17 MED ORDER — PEGFILGRASTIM-BMEZ 6 MG/0.6ML ~~LOC~~ SOSY
6.0000 mg | PREFILLED_SYRINGE | Freq: Once | SUBCUTANEOUS | Status: AC
  Administered 2021-07-17: 6 mg via SUBCUTANEOUS
  Filled 2021-07-17: qty 0.6

## 2021-07-17 NOTE — Patient Instructions (Signed)

## 2021-07-18 ENCOUNTER — Inpatient Hospital Stay: Payer: 59

## 2021-08-05 MED FILL — Dexamethasone Sodium Phosphate Inj 100 MG/10ML: INTRAMUSCULAR | Qty: 1 | Status: AC

## 2021-08-05 MED FILL — Fosaprepitant Dimeglumine For IV Infusion 150 MG (Base Eq): INTRAVENOUS | Qty: 5 | Status: AC

## 2021-08-05 NOTE — Progress Notes (Signed)
Patient Care Team: Hoyt Koch, MD as PCP - General (Internal Medicine) Frederik Pear, MD (Orthopedic Surgery) Rockwell Germany, RN as Oncology Nurse Navigator Mauro Kaufmann, RN as Oncology Nurse Navigator  DIAGNOSIS:    ICD-10-CM   1. Malignant neoplasm of upper-inner quadrant of right breast in female, estrogen receptor positive ([place])  C50.211 MR BREAST BILATERAL W [place] CAD   Z17.0       SUMMARY OF ONCOLOGIC HISTORY: Oncology History  Malignant neoplasm of upper-inner quadrant of right breast in female, estrogen receptor positive ([place])  06/02/2021 Initial Diagnosis   Malignant neoplasm of upper-inner quadrant of right breast in female, estrogen receptor positive ([place])   06/02/2021 Cancer Staging   Staging form: Breast, AJCC 8th Edition - Clinical: Stage IIA (cT2, cN0, cM0, G3, ER+, PR+, HER2-) - Signed by Nicholas Lose, MD on 06/02/2021 Stage prefix: Initial diagnosis Histologic grading system: 3 grade system   06/09/2021 Oncotype testing   Oncotype score 30   06/26/2021 -  Chemotherapy    Patient is on Treatment Plan: BREAST TC Q21D         CHIEF COMPLIANT: Cycle 3 TC  INTERVAL HISTORY: Amy Pittman is a 35 y.o. with above-mentioned history of  right breast cancer, previously on antiestrogen therapy with tamoxifen which was stopped to Oncotype score showing high risk, currently on chemotherapy with Taxotere and Cytoxan. She reports to the clinic for cycle 3.  She tolerated cycle 2 much better.  She was able to work as well.  ALLERGIES:  has No Known Allergies.  MEDICATIONS:  Current Outpatient Medications  Medication Sig Dispense Refill   acetaminophen (TYLENOL) 500 MG tablet TAKE 2 TABLETS (1,000 MG TOTAL) BY MOUTH EVERY SIX HOURS AS NEEDED FOR MILD PAIN OR HEADACHE. 30 tablet 0   dexamethasone (DECADRON) 4 MG tablet Take 1 tablet (4 mg total) by mouth 2 (two) times daily. Take 1 tablet day before chemo and 1 tablet day after chemo  with food 30 tablet 1   levonorgestrel (MIRENA, 52 MG,) 20 MCG/24HR IUD 1 Intra Uterine Device by Intrauterine route once. Every 7 years     lidocaine-prilocaine (EMLA) cream Apply to affected area once 30 g 3   naproxen sodium (ALEVE) 220 MG tablet Take 220 mg by mouth daily as needed (headache).     ondansetron (ZOFRAN) 8 MG tablet Take 1 tablet (8 mg total) by mouth 2 (two) times daily as needed for refractory nausea / vomiting. Start on day 3 after chemo. 30 tablet 1   prochlorperazine (COMPAZINE) 10 MG tablet Take 1 tablet (10 mg total) by mouth every 6 (six) hours as needed (Nausea or vomiting). 30 tablet 1   No current facility-administered medications for this visit.    PHYSICAL EXAMINATION: ECOG PERFORMANCE STATUS: 1 - Symptomatic but completely ambulatory  There were no vitals filed for this visit. There were no vitals filed for this visit.  LABORATORY DATA:  I have reviewed the data as listed CMP Latest Ref Rng & Units 08/06/2021 07/16/2021 07/02/2021  Glucose 70 - 99 mg/dL 91 97 87  BUN 6 - 20 mg/dL 11 14 6   Creatinine 0.44 - 1.00 mg/dL 0.70 0.67 0.74  Sodium 135 - 145 mmol/L 143 141 140  Potassium 3.5 - 5.1 mmol/L 3.4(L) 3.5 3.7  Chloride 98 - 111 mmol/L 108 106 105  CO2 22 - 32 mmol/L 23 24 25   Calcium 8.9 - 10.3 mg/dL 9.4 9.1 9.3  Total Protein 6.5 - 8.1  g/dL 7.0 7.2 7.2  Total Bilirubin 0.3 - 1.2 mg/dL 0.4 0.4 0.5  Alkaline Phos 38 - 126 U/L 54 50 53  AST 15 - 41 U/L 15 14(L) 21  ALT 0 - 44 U/L 18 22 44    Lab Results  Component Value Date   WBC 8.5 08/06/2021   HGB 10.3 (L) 08/06/2021   HCT 29.8 (L) 08/06/2021   MCV 88.2 08/06/2021   PLT 357 08/06/2021   NEUTROABS 6.1 08/06/2021    ASSESSMENT & PLAN:  Malignant neoplasm of upper-inner quadrant of right breast in female, estrogen receptor positive ([place]) Breast MRI 06/01/2021: 4.6 cm spiculated mass in the upper inner quadrant right breast, no abnormal lymph nodes   Right breast biopsy 05/27/2021: Grade 3 IDC  with DCIS ER 35%, PR 80%, 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  Neoadjuvant chemotherapy with Taxotere and Cytoxan every 3 weeks x4  2. followed by breast conserving surgery and sentinel lymph node biopsy 3.  Adjuvant radiation 4. Followed by adjuvant antiestrogen therapy with complete estrogen blockade Fertility preservation: Zoladex injections monthly started 06/19/2021 URCC nausea study -------------------------------------------------------------- Current treatment: Cycle 3 Taxotere and Cytoxan started 06/26/2021   Chemo toxicities: Diarrhea for couple of days improved with Imodium Alopecia We reduced the dosage of for cycle 2 chemo because of nadir count being so low at 0.1 ANC. Denies any nausea or vomiting.  Zoladex injection will be given monthly. She felt tired for a week after chemo. I ordered a breast MRI to be done the week after the last cycle of chemo.  I sent a message to Dr. Marlou Starks to see her after the MRI. Return to clinic in 3 weeks for cycle 4    Orders Placed This Encounter  Procedures   MR BREAST BILATERAL W [place] CAD    Standing Status:   Future    Standing Expiration Date:   08/06/2022    Order Specific Question:   If indicated for the ordered procedure, I authorize the administration of contrast media per Radiology protocol    Answer:   Yes    Order Specific Question:   What is the patient's sedation requirement?    Answer:   No Sedation    Order Specific Question:   Does the patient have a pacemaker or implanted devices?    Answer:   No    Order Specific Question:   Preferred imaging location?    Answer:   [facility] (table limit-550lbs)    Order Specific Question:   Release to patient    Answer:   Immediate    The patient has a good understanding of the overall plan. she agrees with it. she will call with any problems that may develop before the next visit here.  Total time  spent: 30 mins including face to face time and time spent for planning, charting and coordination of care  Rulon Eisenmenger, MD, MPH 08/06/2021  I, Thana Ates, am acting as scribe for Dr. Nicholas Lose.  I have reviewed the above documentation for accuracy and completeness, and I agree with the above.

## 2021-08-06 ENCOUNTER — Encounter: Payer: Self-pay | Admitting: Genetic Counselor

## 2021-08-06 ENCOUNTER — Inpatient Hospital Stay: Payer: 59

## 2021-08-06 ENCOUNTER — Other Ambulatory Visit: Payer: Self-pay

## 2021-08-06 ENCOUNTER — Inpatient Hospital Stay: Payer: 59 | Attending: Hematology and Oncology

## 2021-08-06 ENCOUNTER — Inpatient Hospital Stay: Payer: 59 | Admitting: Hematology and Oncology

## 2021-08-06 VITALS — BP 134/93 | HR 95 | Temp 97.9°F | Resp 18 | Wt 200.2 lb

## 2021-08-06 DIAGNOSIS — Z79899 Other long term (current) drug therapy: Secondary | ICD-10-CM | POA: Insufficient documentation

## 2021-08-06 DIAGNOSIS — Z5189 Encounter for other specified aftercare: Secondary | ICD-10-CM | POA: Insufficient documentation

## 2021-08-06 DIAGNOSIS — Z1379 Encounter for other screening for genetic and chromosomal anomalies: Secondary | ICD-10-CM | POA: Insufficient documentation

## 2021-08-06 DIAGNOSIS — Z170 Estrogen receptor positive status [ER+]: Secondary | ICD-10-CM

## 2021-08-06 DIAGNOSIS — C50211 Malignant neoplasm of upper-inner quadrant of right female breast: Secondary | ICD-10-CM

## 2021-08-06 DIAGNOSIS — Z5111 Encounter for antineoplastic chemotherapy: Secondary | ICD-10-CM | POA: Diagnosis not present

## 2021-08-06 DIAGNOSIS — Z95828 Presence of other vascular implants and grafts: Secondary | ICD-10-CM

## 2021-08-06 LAB — CMP (CANCER CENTER ONLY)
ALT: 18 U/L (ref 0–44)
AST: 15 U/L (ref 15–41)
Albumin: 4 g/dL (ref 3.5–5.0)
Alkaline Phosphatase: 54 U/L (ref 38–126)
Anion gap: 12 (ref 5–15)
BUN: 11 mg/dL (ref 6–20)
CO2: 23 mmol/L (ref 22–32)
Calcium: 9.4 mg/dL (ref 8.9–10.3)
Chloride: 108 mmol/L (ref 98–111)
Creatinine: 0.7 mg/dL (ref 0.44–1.00)
GFR, Estimated: >60 mL/min (ref >60)
Glucose, Bld: 91 mg/dL (ref 70–99)
Potassium: 3.4 mmol/L — ABNORMAL LOW (ref 3.5–5.1)
Sodium: 143 mmol/L (ref 135–145)
Total Bilirubin: 0.4 mg/dL (ref 0.3–1.2)
Total Protein: 7 g/dL (ref 6.5–8.1)

## 2021-08-06 LAB — CBC WITH DIFFERENTIAL (CANCER CENTER ONLY)
Abs Immature Granulocytes: 0.04 10*3/uL (ref 0.00–0.07)
Basophils Absolute: 0 10*3/uL (ref 0.0–0.1)
Basophils Relative: 0 %
Eosinophils Absolute: 0 10*3/uL (ref 0.0–0.5)
Eosinophils Relative: 0 %
HCT: 29.8 % — ABNORMAL LOW (ref 36.0–46.0)
Hemoglobin: 10.3 g/dL — ABNORMAL LOW (ref 12.0–15.0)
Immature Granulocytes: 1 %
Lymphocytes Relative: 19 %
Lymphs Abs: 1.7 10*3/uL (ref 0.7–4.0)
MCH: 30.5 pg (ref 26.0–34.0)
MCHC: 34.6 g/dL (ref 30.0–36.0)
MCV: 88.2 fL (ref 80.0–100.0)
Monocytes Absolute: 0.7 10*3/uL (ref 0.1–1.0)
Monocytes Relative: 9 %
Neutro Abs: 6.1 10*3/uL (ref 1.7–7.7)
Neutrophils Relative %: 71 %
Platelet Count: 357 10*3/uL (ref 150–400)
RBC: 3.38 MIL/uL — ABNORMAL LOW (ref 3.87–5.11)
RDW: 15.1 % (ref 11.5–15.5)
WBC Count: 8.5 10*3/uL (ref 4.0–10.5)
nRBC: 0 % (ref 0.0–0.2)

## 2021-08-06 MED ORDER — HEPARIN SOD (PORK) LOCK FLUSH 100 UNIT/ML IV SOLN
500.0000 [IU] | Freq: Once | INTRAVENOUS | Status: AC | PRN
  Administered 2021-08-06: 500 [IU]

## 2021-08-06 MED ORDER — SODIUM CHLORIDE 0.9 % IV SOLN
65.0000 mg/m2 | Freq: Once | INTRAVENOUS | Status: AC
  Administered 2021-08-06: 130 mg via INTRAVENOUS
  Filled 2021-08-06: qty 13

## 2021-08-06 MED ORDER — PALONOSETRON HCL INJECTION 0.25 MG/5ML
0.2500 mg | Freq: Once | INTRAVENOUS | Status: AC
  Administered 2021-08-06: 0.25 mg via INTRAVENOUS
  Filled 2021-08-06: qty 5

## 2021-08-06 MED ORDER — SODIUM CHLORIDE 0.9 % IV SOLN
500.0000 mg/m2 | Freq: Once | INTRAVENOUS | Status: AC
  Administered 2021-08-06: 1020 mg via INTRAVENOUS
  Filled 2021-08-06: qty 51

## 2021-08-06 MED ORDER — SODIUM CHLORIDE 0.9% FLUSH
10.0000 mL | INTRAVENOUS | Status: DC | PRN
  Administered 2021-08-06: 10 mL

## 2021-08-06 MED ORDER — SODIUM CHLORIDE 0.9 % IV SOLN: Freq: Once | INTRAVENOUS | Status: AC

## 2021-08-06 MED ORDER — SODIUM CHLORIDE 0.9% FLUSH
10.0000 mL | INTRAVENOUS | Status: AC | PRN
  Administered 2021-08-06: 10 mL

## 2021-08-06 MED ORDER — SODIUM CHLORIDE 0.9 % IV SOLN
10.0000 mg | Freq: Once | INTRAVENOUS | Status: AC
  Administered 2021-08-06: 10 mg via INTRAVENOUS
  Filled 2021-08-06: qty 10

## 2021-08-06 MED ORDER — SODIUM CHLORIDE 0.9 % IV SOLN
150.0000 mg | Freq: Once | INTRAVENOUS | Status: AC
  Administered 2021-08-06: 150 mg via INTRAVENOUS
  Filled 2021-08-06: qty 150

## 2021-08-06 NOTE — Patient Instructions (Signed)
[facility]  Discharge Instructions: Thank you for choosing [facility] to provide your oncology and hematology care.   If you have a lab appointment with the Cancer Center, please go directly to the Cancer Center and check in at the registration area.   Wear comfortable clothing and clothing appropriate for easy access to any Portacath or PICC line.   We strive to give you quality time with your provider. You may need to reschedule your appointment if you arrive late (15 or more minutes).  Arriving late affects you and other patients whose appointments are after yours.  Also, if you miss three or more appointments without notifying the office, you may be dismissed from the clinic at the provider's discretion.      For prescription refill requests, have your pharmacy contact our office and allow 72 hours for refills to be completed.    Today you received the following chemotherapy and/or immunotherapy agents : Taxotere, Cytoxan     To help prevent nausea and vomiting after your treatment, we encourage you to take your nausea medication as directed.  BELOW ARE SYMPTOMS THAT SHOULD BE REPORTED IMMEDIATELY: *FEVER GREATER THAN 100.4 F (38 C) OR HIGHER *CHILLS OR SWEATING *NAUSEA AND VOMITING THAT IS NOT CONTROLLED WITH YOUR NAUSEA MEDICATION *UNUSUAL SHORTNESS OF BREATH *UNUSUAL BRUISING OR BLEEDING *URINARY PROBLEMS (pain or burning when urinating, or frequent urination) *BOWEL PROBLEMS (unusual diarrhea, constipation, pain near the anus) TENDERNESS IN MOUTH AND THROAT WITH OR WITHOUT PRESENCE OF ULCERS (sore throat, sores in mouth, or a toothache) UNUSUAL RASH, SWELLING OR PAIN  UNUSUAL VAGINAL DISCHARGE OR ITCHING   Items with * indicate a potential emergency and should be followed up as soon as possible or go to the Emergency Department if any problems should occur.  Please show the CHEMOTHERAPY ALERT CARD or IMMUNOTHERAPY ALERT CARD at  check-in to the Emergency Department and triage nurse.  Should you have questions after your visit or need to cancel or reschedule your appointment, please contact [facility]  Dept: [phone]  and follow the prompts.  Office hours are 8:00 a.m. to 4:30 p.m. Monday - Friday. Please note that voicemails left after 4:00 p.m. may not be returned until the following business day.  We are closed weekends and major holidays. You have access to a nurse at all times for urgent questions. Please call the main number to the clinic Dept: [phone] and follow the prompts.   For any non-urgent questions, you may also contact your provider using MyChart. We now offer e-Visits for anyone 18 and older to request care online for non-urgent symptoms. For details visit [email or web address].   Also download the MyChart app! Go to the app store, search "MyChart", open the app, select [facility], and log in with your MyChart username and password.  Due to Covid, a mask is required upon entering the hospital/clinic. If you do not have a mask, one will be given to you upon arrival. For doctor visits, patients may have 1 support person aged 18 or older with them. For treatment visits, patients cannot have anyone with them due to current Covid guidelines and our immunocompromised population.   

## 2021-08-06 NOTE — Assessment & Plan Note (Signed)
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%, PR 80%, Ki-67 60%, HER2 1+ negative, T2 N0 stageIIA Oncotype score: 30: High risk  Treatment plan: 1.Neoadjuvant chemotherapy with Taxotere and Cytoxan every 3 weeks x4 2.followed by breast conserving surgery and sentinel lymph node biopsy 3.Adjuvant radiation 4.Followed by adjuvant antiestrogen therapy with complete estrogen blockade Fertility preservation: Zoladex injections monthly started 06/19/2021 [facility]RCC nausea study -------------------------------------------------------------- Current treatment:Cycle 3 Taxotere and Cytoxan started 06/26/2021  Chemo toxicities: 1. Diarrhea for couple of days improved with Imodium 2. Alopecia 3. We reduced the dosage of for cycle 2 chemo because of nadir count being so low at 0.1 ANC.  She will receive the Zoladex injection today and Neulasta tomorrow. She did not experience any nausea or vomiting.  She felt tired for a week after chemo.  Return to clinic in 3 weeks for cycle 4

## 2021-08-08 ENCOUNTER — Inpatient Hospital Stay: Payer: 59

## 2021-08-08 ENCOUNTER — Other Ambulatory Visit: Payer: Self-pay

## 2021-08-08 VITALS — BP 102/71 | HR 79 | Temp 97.5°F | Resp 20

## 2021-08-08 DIAGNOSIS — C50211 Malignant neoplasm of upper-inner quadrant of right female breast: Secondary | ICD-10-CM

## 2021-08-08 DIAGNOSIS — Z5189 Encounter for other specified aftercare: Secondary | ICD-10-CM | POA: Diagnosis not present

## 2021-08-08 DIAGNOSIS — Z170 Estrogen receptor positive status [ER+]: Secondary | ICD-10-CM | POA: Diagnosis not present

## 2021-08-08 DIAGNOSIS — Z5111 Encounter for antineoplastic chemotherapy: Secondary | ICD-10-CM | POA: Diagnosis not present

## 2021-08-08 DIAGNOSIS — Z79899 Other long term (current) drug therapy: Secondary | ICD-10-CM | POA: Diagnosis not present

## 2021-08-08 MED ORDER — PEGFILGRASTIM-BMEZ 6 MG/0.6ML ~~LOC~~ SOSY
6.0000 mg | PREFILLED_SYRINGE | Freq: Once | SUBCUTANEOUS | Status: AC
  Administered 2021-08-08: 6 mg via SUBCUTANEOUS

## 2021-08-08 MED ORDER — PEGFILGRASTIM-BMEZ 6 MG/0.6ML ~~LOC~~ SOSY
PREFILLED_SYRINGE | SUBCUTANEOUS | Status: AC
  Filled 2021-08-08: qty 0.6

## 2021-08-14 ENCOUNTER — Inpatient Hospital Stay: Payer: 59

## 2021-08-14 ENCOUNTER — Other Ambulatory Visit: Payer: Self-pay

## 2021-08-14 VITALS — BP 120/77 | HR 94 | Temp 98.6°F | Resp 15

## 2021-08-14 DIAGNOSIS — C50211 Malignant neoplasm of upper-inner quadrant of right female breast: Secondary | ICD-10-CM

## 2021-08-14 DIAGNOSIS — Z170 Estrogen receptor positive status [ER+]: Secondary | ICD-10-CM | POA: Diagnosis not present

## 2021-08-14 DIAGNOSIS — Z5111 Encounter for antineoplastic chemotherapy: Secondary | ICD-10-CM | POA: Diagnosis not present

## 2021-08-14 DIAGNOSIS — Z79899 Other long term (current) drug therapy: Secondary | ICD-10-CM | POA: Diagnosis not present

## 2021-08-14 DIAGNOSIS — Z5189 Encounter for other specified aftercare: Secondary | ICD-10-CM | POA: Diagnosis not present

## 2021-08-14 MED ORDER — GOSERELIN ACETATE 3.6 MG ~~LOC~~ IMPL
3.6000 mg | DRUG_IMPLANT | Freq: Once | SUBCUTANEOUS | Status: AC
  Administered 2021-08-14: 3.6 mg via SUBCUTANEOUS
  Filled 2021-08-14: qty 3.6

## 2021-08-26 MED FILL — Fosaprepitant Dimeglumine For IV Infusion 150 MG (Base Eq): INTRAVENOUS | Qty: 5 | Status: AC

## 2021-08-26 MED FILL — Dexamethasone Sodium Phosphate Inj 100 MG/10ML: INTRAMUSCULAR | Qty: 1 | Status: AC

## 2021-08-26 NOTE — Progress Notes (Signed)
Patient Care Team: Hoyt Koch, MD as PCP - General (Internal Medicine) Frederik Pear, MD (Orthopedic Surgery) Rockwell Germany, RN as Oncology Nurse Navigator Mauro Kaufmann, RN as Oncology Nurse Navigator  DIAGNOSIS:    ICD-10-CM   1. Malignant neoplasm of upper-inner quadrant of right breast in female, estrogen receptor positive ([place])  C50.211    Z17.0       SUMMARY OF ONCOLOGIC HISTORY: Oncology History  Malignant neoplasm of upper-inner quadrant of right breast in female, estrogen receptor positive ([place])  06/02/2021 Initial Diagnosis   Malignant neoplasm of upper-inner quadrant of right breast in female, estrogen receptor positive ([place])   06/02/2021 Cancer Staging   Staging form: Breast, AJCC 8th Edition - Clinical: Stage IIA (cT2, cN0, cM0, G3, ER+, PR+, HER2-) - Signed by Nicholas Lose, MD on 06/02/2021 Stage prefix: Initial diagnosis Histologic grading system: 3 grade system   06/09/2021 Oncotype testing   Oncotype score 30   06/26/2021 -  Chemotherapy    Patient is on Treatment Plan: BREAST TC Q21D         CHIEF COMPLIANT: Cycle 4 TC  INTERVAL HISTORY: Amy Pittman is a 35 y.o. with above-mentioned history of right breast cancer, previously on antiestrogen therapy with tamoxifen which was stopped to Oncotype score showing high risk, currently on chemotherapy with Taxotere and Cytoxan. She reports to the clinic for cycle 4.  Other than the fatigue, she has tolerated the treatment extremely well.  She does not have any nausea or vomiting.  ALLERGIES:  has No Known Allergies.  MEDICATIONS:  Current Outpatient Medications  Medication Sig Dispense Refill   acetaminophen (TYLENOL) 500 MG tablet TAKE 2 TABLETS (1,000 MG TOTAL) BY MOUTH EVERY SIX HOURS AS NEEDED FOR MILD PAIN OR HEADACHE. 30 tablet 0   levonorgestrel (MIRENA, 52 MG,) 20 MCG/24HR IUD 1 Intra Uterine Device by Intrauterine route once. Every 7 years     lidocaine-prilocaine (EMLA)  cream Apply to affected area once 30 g 3   No current facility-administered medications for this visit.    PHYSICAL EXAMINATION: ECOG PERFORMANCE STATUS: 1 - Symptomatic but completely ambulatory  Vitals:   08/27/21 0841  BP: 123/82  Pulse: 95  Resp: 18  Temp: 97.6 F (36.4 C)  SpO2: 100%   Filed Weights   08/27/21 0841  Weight: 197 lb 11.2 oz (89.7 kg)    LABORATORY DATA:  I have reviewed the data as listed CMP Latest Ref Rng & Units 08/06/2021 07/16/2021 07/02/2021  Glucose 70 - 99 mg/dL 91 97 87  BUN 6 - 20 mg/dL _0 Creatinine 0.44 - 1.00 mg/dL 0.70 0.67 0.74  Sodium 135 - 145 mmol/L 143 141 140  Potassium 3.5 - 5.1 mmol/L 3.4(L) 3.5 3.7  Chloride 98 - 111 mmol/L 108 106 105  CO2 22 - 32 mmol/L _1 Calcium 8.9 - 10.3 mg/dL 9.4 9.1 9.3  Total Protein 6.5 - 8.1 g/dL 7.0 7.2 7.2  Total Bilirubin 0.3 - 1.2 mg/dL 0.4 0.4 0.5  Alkaline Phos 38 - 126 U/L 54 50 53  AST 15 - 41 U/L 15 14(L) 21  ALT 0 - 44 U/L 18 22 44    Lab Results  Component Value Date   WBC 9.5 08/27/2021   HGB 11.2 (L) 08/27/2021   HCT 33.5 (L) 08/27/2021   MCV 90.3 08/27/2021   PLT 332 08/27/2021   NEUTROABS 7.8 (H) 08/27/2021    ASSESSMENT & PLAN:  Malignant neoplasm of  upper-inner quadrant of right breast in female, estrogen receptor positive ([facility]) Breast MRI 06/01/2021: 4.6 cm spiculated mass in the upper inner quadrant right breast, no abnormal lymph nodes   Right breast biopsy 05/27/2021: 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  Neoadjuvant chemotherapy with Taxotere and Cytoxan every 3 weeks x4  2. followed by breast conserving surgery and sentinel lymph node biopsy 3.  Adjuvant radiation 4. Followed by adjuvant antiestrogen therapy with complete estrogen blockade Fertility preservation: Zoladex injections monthly started 06/19/2021 URCC nausea  study -------------------------------------------------------------- Current treatment: Cycle 4 Taxotere and Cytoxan started 06/26/2021   Chemo toxicities: Diarrhea for couple of days improved with Imodium Alopecia We reduced the dosage of for cycle 2 chemo because of nadir count being so low at 0.1 ANC. Denies any nausea or vomiting.   Zoladex injection will be given monthly.  Breast MRI scheduled for 09/03/2021 Return to clinic after surgery to discuss pathology report.      No orders of the defined types were placed in this encounter.  The patient has a good understanding of the overall plan. she agrees with it. she will call with any problems that may develop before the next visit here.  Total time spent: 30 mins including face to face time and time spent for planning, charting and coordination of care  Vinay K Gudena, MD, MPH 08/27/2021  I, Kirstyn Evans, am acting as scribe for Dr. Vinay Gudena.  I have reviewed the above documentation for accuracy and completeness, and I agree with the above.       

## 2021-08-27 ENCOUNTER — Encounter: Payer: Self-pay | Admitting: *Deleted

## 2021-08-27 ENCOUNTER — Inpatient Hospital Stay: Payer: 59

## 2021-08-27 ENCOUNTER — Inpatient Hospital Stay: Payer: 59 | Admitting: Hematology and Oncology

## 2021-08-27 ENCOUNTER — Other Ambulatory Visit: Payer: Self-pay

## 2021-08-27 DIAGNOSIS — Z95828 Presence of other vascular implants and grafts: Secondary | ICD-10-CM

## 2021-08-27 DIAGNOSIS — C50211 Malignant neoplasm of upper-inner quadrant of right female breast: Secondary | ICD-10-CM

## 2021-08-27 DIAGNOSIS — Z170 Estrogen receptor positive status [ER+]: Secondary | ICD-10-CM | POA: Diagnosis not present

## 2021-08-27 DIAGNOSIS — Z79899 Other long term (current) drug therapy: Secondary | ICD-10-CM | POA: Diagnosis not present

## 2021-08-27 DIAGNOSIS — Z5189 Encounter for other specified aftercare: Secondary | ICD-10-CM | POA: Diagnosis not present

## 2021-08-27 DIAGNOSIS — Z5111 Encounter for antineoplastic chemotherapy: Secondary | ICD-10-CM | POA: Diagnosis not present

## 2021-08-27 LAB — CBC WITH DIFFERENTIAL (CANCER CENTER ONLY)
Abs Immature Granulocytes: 0.02 10*3/uL (ref 0.00–0.07)
Basophils Absolute: 0 10*3/uL (ref 0.0–0.1)
Basophils Relative: 0 %
Eosinophils Absolute: 0 10*3/uL (ref 0.0–0.5)
Eosinophils Relative: 0 %
HCT: 33.5 % — ABNORMAL LOW (ref 36.0–46.0)
Hemoglobin: 11.2 g/dL — ABNORMAL LOW (ref 12.0–15.0)
Immature Granulocytes: 0 %
Lymphocytes Relative: 12 %
Lymphs Abs: 1.1 10*3/uL (ref 0.7–4.0)
MCH: 30.2 pg (ref 26.0–34.0)
MCHC: 33.4 g/dL (ref 30.0–36.0)
MCV: 90.3 fL (ref 80.0–100.0)
Monocytes Absolute: 0.6 10*3/uL (ref 0.1–1.0)
Monocytes Relative: 6 %
Neutro Abs: 7.8 10*3/uL — ABNORMAL HIGH (ref 1.7–7.7)
Neutrophils Relative %: 82 %
Platelet Count: 332 10*3/uL (ref 150–400)
RBC: 3.71 MIL/uL — ABNORMAL LOW (ref 3.87–5.11)
RDW: 15.7 % — ABNORMAL HIGH (ref 11.5–15.5)
WBC Count: 9.5 10*3/uL (ref 4.0–10.5)
nRBC: 0 % (ref 0.0–0.2)

## 2021-08-27 LAB — CMP (CANCER CENTER ONLY)
ALT: 15 U/L (ref 0–44)
AST: 12 U/L — ABNORMAL LOW (ref 15–41)
Albumin: 4.1 g/dL (ref 3.5–5.0)
Alkaline Phosphatase: 64 U/L (ref 38–126)
Anion gap: 12 (ref 5–15)
BUN: 7 mg/dL (ref 6–20)
CO2: 21 mmol/L — ABNORMAL LOW (ref 22–32)
Calcium: 9.7 mg/dL (ref 8.9–10.3)
Chloride: 110 mmol/L (ref 98–111)
Creatinine: 0.65 mg/dL (ref 0.44–1.00)
GFR, Estimated: >60 mL/min (ref >60)
Glucose, Bld: 113 mg/dL — ABNORMAL HIGH (ref 70–99)
Potassium: 3.7 mmol/L (ref 3.5–5.1)
Sodium: 143 mmol/L (ref 135–145)
Total Bilirubin: 0.3 mg/dL (ref 0.3–1.2)
Total Protein: 7.3 g/dL (ref 6.5–8.1)

## 2021-08-27 MED ORDER — HEPARIN SOD (PORK) LOCK FLUSH 100 UNIT/ML IV SOLN
500.0000 [IU] | Freq: Once | INTRAVENOUS | Status: AC | PRN
  Administered 2021-08-27: 500 [IU]

## 2021-08-27 MED ORDER — SODIUM CHLORIDE 0.9 % IV SOLN
65.0000 mg/m2 | Freq: Once | INTRAVENOUS | Status: AC
  Administered 2021-08-27: 130 mg via INTRAVENOUS
  Filled 2021-08-27: qty 13

## 2021-08-27 MED ORDER — SODIUM CHLORIDE 0.9 % IV SOLN: Freq: Once | INTRAVENOUS | Status: AC

## 2021-08-27 MED ORDER — SODIUM CHLORIDE 0.9 % IV SOLN
150.0000 mg | Freq: Once | INTRAVENOUS | Status: AC
  Administered 2021-08-27: 150 mg via INTRAVENOUS
  Filled 2021-08-27: qty 150

## 2021-08-27 MED ORDER — SODIUM CHLORIDE 0.9% FLUSH
10.0000 mL | INTRAVENOUS | Status: DC | PRN
  Administered 2021-08-27: 10 mL via INTRAVENOUS

## 2021-08-27 MED ORDER — SODIUM CHLORIDE 0.9 % IV SOLN
10.0000 mg | Freq: Once | INTRAVENOUS | Status: AC
  Administered 2021-08-27: 10 mg via INTRAVENOUS
  Filled 2021-08-27: qty 10

## 2021-08-27 MED ORDER — SODIUM CHLORIDE 0.9 % IV SOLN
500.0000 mg/m2 | Freq: Once | INTRAVENOUS | Status: AC
  Administered 2021-08-27: 1020 mg via INTRAVENOUS
  Filled 2021-08-27: qty 51

## 2021-08-27 MED ORDER — PALONOSETRON HCL INJECTION 0.25 MG/5ML
0.2500 mg | Freq: Once | INTRAVENOUS | Status: AC
  Administered 2021-08-27: 0.25 mg via INTRAVENOUS
  Filled 2021-08-27: qty 5

## 2021-08-27 MED ORDER — SODIUM CHLORIDE 0.9% FLUSH
10.0000 mL | INTRAVENOUS | Status: DC | PRN
  Administered 2021-08-27: 10 mL

## 2021-08-27 NOTE — Patient Instructions (Signed)
[facility]   Discharge Instructions: Thank you for choosing [facility] to provide your oncology and hematology care.   If you have a lab appointment with the [facility], please go directly to the [facility] and check in at the registration area.   Wear comfortable clothing and clothing appropriate for easy access to any Portacath or PICC line.   We strive to give you quality time with your provider. You may need to reschedule your appointment if you arrive late (15 or more minutes).  Arriving late affects you and other patients whose appointments are after yours.  Also, if you miss three or more appointments without notifying the office, you may be dismissed from the clinic at the provider's discretion.      For prescription refill requests, have your pharmacy contact our office and allow 72 hours for refills to be completed.    Today you received the following chemotherapy and/or immunotherapy agents: Docetaxel (Tacotere) and Cycloposphamide (Cytoxan).      To help prevent nausea and vomiting after your treatment, we encourage you to take your nausea medication as directed.  BELOW ARE SYMPTOMS THAT SHOULD BE REPORTED IMMEDIATELY: *FEVER GREATER THAN 100.4 F (38 C) OR HIGHER *CHILLS OR SWEATING *NAUSEA AND VOMITING THAT IS NOT CONTROLLED WITH YOUR NAUSEA MEDICATION *UNUSUAL SHORTNESS OF BREATH *UNUSUAL BRUISING OR BLEEDING *URINARY PROBLEMS (pain or burning when urinating, or frequent urination) *BOWEL PROBLEMS (unusual diarrhea, constipation, pain near the anus) TENDERNESS IN MOUTH AND THROAT WITH OR WITHOUT PRESENCE OF ULCERS (sore throat, sores in mouth, or a toothache) UNUSUAL RASH, SWELLING OR PAIN  UNUSUAL VAGINAL DISCHARGE OR ITCHING   Items with * indicate a potential emergency and should be followed up as soon as possible or go to the Emergency Department if any problems should occur.  Please show the CHEMOTHERAPY ALERT CARD  or IMMUNOTHERAPY ALERT CARD at check-in to the Emergency Department and triage nurse.  Should you have questions after your visit or need to cancel or reschedule your appointment, please contact [facility]  Dept: [phone]  and follow the prompts.  Office hours are 8:00 a.m. to 4:30 p.m. Monday - Friday. Please note that voicemails left after 4:00 p.m. may not be returned until the following business day.  We are closed weekends and major holidays. You have access to a nurse at all times for urgent questions. Please call the main number to the clinic Dept: [phone] and follow the prompts.   For any non-urgent questions, you may also contact your provider using MyChart. We now offer e-Visits for anyone 57 and older to request care online for non-urgent symptoms. For details visit [email or web address].   Also download the MyChart app! Go to the app store, search "MyChart", open the app, select [facility], and log in with your MyChart username and password.  Due to Covid, a mask is required upon entering the hospital/clinic. If you do not have a mask, one will be given to you upon arrival. For doctor visits, patients may have 1 support person aged 60 or older with them. For treatment visits, patients cannot have anyone with them due to current Covid guidelines and our immunocompromised population.

## 2021-08-27 NOTE — Assessment & Plan Note (Signed)
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%, PR 80%, Ki-67 60%, HER2 1+ negative, T2 N0 stageIIA Oncotype score: 30: High risk  Treatment plan: 1.Neoadjuvant chemotherapy with Taxotere and Cytoxan every 3 weeks x4 2.followed by breast conserving surgery and sentinel lymph node biopsy 3.Adjuvant radiation 4.Followed by adjuvant antiestrogen therapy with complete estrogen blockade Fertility preservation: Zoladex injections monthly started 06/19/2021 [facility] nausea study -------------------------------------------------------------- Current treatment:Cycle4Taxotere and Cytoxan started7/22/2022  Chemo toxicities: 1. Diarrhea for couple of days improved with Imodium 2. Alopecia 3. We reduced the dosage of for cycle 2 chemo because of nadir count being so low at 0.1 ANC. Denies any nausea or vomiting.  Zoladex injection will be given monthly.  Breast MRI scheduled for 09/03/2021

## 2021-08-29 ENCOUNTER — Other Ambulatory Visit: Payer: Self-pay

## 2021-08-29 ENCOUNTER — Inpatient Hospital Stay: Payer: 59

## 2021-08-29 VITALS — BP 116/74 | HR 78 | Temp 98.0°F | Resp 17

## 2021-08-29 DIAGNOSIS — Z5111 Encounter for antineoplastic chemotherapy: Secondary | ICD-10-CM | POA: Diagnosis not present

## 2021-08-29 DIAGNOSIS — Z5189 Encounter for other specified aftercare: Secondary | ICD-10-CM | POA: Diagnosis not present

## 2021-08-29 DIAGNOSIS — C50211 Malignant neoplasm of upper-inner quadrant of right female breast: Secondary | ICD-10-CM

## 2021-08-29 DIAGNOSIS — Z170 Estrogen receptor positive status [ER+]: Secondary | ICD-10-CM

## 2021-08-29 DIAGNOSIS — Z79899 Other long term (current) drug therapy: Secondary | ICD-10-CM | POA: Diagnosis not present

## 2021-08-29 MED ORDER — PEGFILGRASTIM-BMEZ 6 MG/0.6ML ~~LOC~~ SOSY: 6.0000 mg | PREFILLED_SYRINGE | Freq: Once | SUBCUTANEOUS | Status: AC

## 2021-08-29 MED ORDER — PEGFILGRASTIM-BMEZ 6 MG/0.6ML ~~LOC~~ SOSY
PREFILLED_SYRINGE | SUBCUTANEOUS | Status: AC
  Administered 2021-08-29: 6 mg via SUBCUTANEOUS
  Filled 2021-08-29: qty 0.6

## 2021-09-03 ENCOUNTER — Ambulatory Visit
Admission: RE | Admit: 2021-09-03 | Discharge: 2021-09-03 | Disposition: A | Discharge status: Home / Self Care | Payer: 59 | Source: Ambulatory Visit | Attending: Hematology and Oncology | Admitting: Hematology and Oncology

## 2021-09-03 DIAGNOSIS — C50211 Malignant neoplasm of upper-inner quadrant of right female breast: Secondary | ICD-10-CM | POA: Diagnosis not present

## 2021-09-03 DIAGNOSIS — Z170 Estrogen receptor positive status [ER+]: Secondary | ICD-10-CM

## 2021-09-03 MED ORDER — GADOBUTROL 1 MMOL/ML IV SOLN
8.0000 mL | Freq: Once | INTRAVENOUS | Status: AC | PRN
  Administered 2021-09-03: 8 mL via INTRAVENOUS

## 2021-09-04 ENCOUNTER — Encounter: Payer: Self-pay | Admitting: Hematology and Oncology

## 2021-09-04 ENCOUNTER — Encounter: Payer: Self-pay | Admitting: *Deleted

## 2021-09-04 NOTE — Progress Notes (Signed)
Enrolled pt in the copay program for Ziextenzo thru [place].  The maximum benefit is $10,000 annually.  Pt's OOP is $0 for each dose or cycle.

## 2021-09-14 ENCOUNTER — Inpatient Hospital Stay: Payer: 59 | Attending: Hematology and Oncology

## 2021-09-14 ENCOUNTER — Other Ambulatory Visit: Payer: Self-pay

## 2021-09-14 VITALS — BP 117/79 | HR 97 | Temp 98.5°F | Resp 18

## 2021-09-14 DIAGNOSIS — C50211 Malignant neoplasm of upper-inner quadrant of right female breast: Secondary | ICD-10-CM | POA: Diagnosis not present

## 2021-09-14 DIAGNOSIS — Z170 Estrogen receptor positive status [ER+]: Secondary | ICD-10-CM | POA: Insufficient documentation

## 2021-09-14 MED ORDER — GOSERELIN ACETATE 3.6 MG ~~LOC~~ IMPL
3.6000 mg | DRUG_IMPLANT | Freq: Once | SUBCUTANEOUS | Status: AC
  Administered 2021-09-14: 3.6 mg via SUBCUTANEOUS
  Filled 2021-09-14: qty 3.6

## 2021-09-14 NOTE — Patient Instructions (Signed)
Goserelin injection What is this medication? GOSERELIN (GOE se rel in) is similar to a hormone found in the body. It lowers the amount of sex hormones that the body makes. Men will have lower testosterone levels and women will have lower estrogen levels while taking this medicine. In men, this medicine is used to treat prostate cancer; the injection is either given once per month or once every 12 weeks. A once per month injection (only) is used to treat women with endometriosis, dysfunctional uterine bleeding, or advanced breast cancer. This medicine may be used for other purposes; ask your health care provider or pharmacist if you have questions. COMMON BRAND NAME(S): Zoladex What should I tell my care team before I take this medication? They need to know if you have any of these conditions: bone problems diabetes heart disease history of irregular heartbeat an unusual or allergic reaction to goserelin, other medicines, foods, dyes, or preservatives pregnant or trying to get pregnant breast-feeding How should I use this medication? This medicine is for injection under the skin. It is given by a health care professional in a hospital or clinic setting. Talk to your pediatrician regarding the use of this medicine in children. Special care may be needed. Overdosage: If you think you have taken too much of this medicine contact a poison control center or emergency room at once. NOTE: This medicine is only for you. Do not share this medicine with others. What if I miss a dose? It is important not to miss your dose. Call your doctor or health care professional if you are unable to keep an appointment. What may interact with this medication? Do not take this medicine with any of the following medications: cisapride dronedarone pimozide thioridazine This medicine may also interact with the following medications: other medicines that prolong the QT interval (an abnormal heart rhythm) This list  may not describe all possible interactions. Give your health care provider a list of all the medicines, herbs, non-prescription drugs, or dietary supplements you use. Also tell them if you smoke, drink alcohol, or use illegal drugs. Some items may interact with your medicine. What should I watch for while using this medication? Visit your doctor or health care provider for regular checks on your progress. Your symptoms may appear to get worse during the first weeks of this therapy. Tell your doctor or healthcare provider if your symptoms do not start to get better or if they get worse after this time. Your bones may get weaker if you take this medicine for a long time. If you smoke or frequently drink alcohol you may increase your risk of bone loss. A family history of osteoporosis, chronic use of drugs for seizures (convulsions), or corticosteroids can also increase your risk of bone loss. Talk to your doctor about how to keep your bones strong. This medicine should stop regular monthly menstruation in women. Tell your doctor if you continue to menstruate. Women should not become pregnant while taking this medicine or for 12 weeks after stopping this medicine. Women should inform their doctor if they wish to become pregnant or think they might be pregnant. There is a potential for serious side effects to an unborn child. Talk to your health care professional or pharmacist for more information. Do not breast-feed an infant while taking this medicine. Men should inform their doctors if they wish to father a child. This medicine may lower sperm counts. Talk to your health care professional or pharmacist for more information. This medicine may   increase blood sugar. Ask your healthcare provider if changes in diet or medicines are needed if you have diabetes. What side effects may I notice from receiving this medication? Side effects that you should report to your doctor or health care professional as soon as  possible: allergic reactions like skin rash, itching or hives, swelling of the face, lips, or tongue bone pain breathing problems changes in vision chest pain feeling faint or lightheaded, falls fever, chills pain, swelling, warmth in the leg pain, tingling, numbness in the hands or feet signs and symptoms of high blood sugar such as being more thirsty or hungry or having to urinate more than normal. You may also feel very tired or have blurry vision signs and symptoms of low blood pressure like dizziness; feeling faint or lightheaded, falls; unusually weak or tired stomach pain swelling of the ankles, feet, hands trouble passing urine or change in the amount of urine unusually high or low blood pressure unusually weak or tired Side effects that usually do not require medical attention (report to your doctor or health care professional if they continue or are bothersome): change in sex drive or performance changes in breast size in both males and females changes in emotions or moods headache hot flashes irritation at site where injected loss of appetite skin problems like acne, dry skin vaginal dryness This list may not describe all possible side effects. Call your doctor for medical advice about side effects. You may report side effects to FDA at 1-800-FDA-1088. Where should I keep my medication? This drug is given in a hospital or clinic and will not be stored at home. NOTE: This sheet is a summary. It may not cover all possible information. If you have questions about this medicine, talk to your doctor, pharmacist, or health care provider.  2022 Elsevier/Gold Standard (2019-03-12 14:05:56)  

## 2021-09-17 ENCOUNTER — Ambulatory Visit: Payer: Self-pay | Admitting: General Surgery

## 2021-09-17 DIAGNOSIS — Z170 Estrogen receptor positive status [ER+]: Secondary | ICD-10-CM | POA: Diagnosis not present

## 2021-09-17 DIAGNOSIS — C50211 Malignant neoplasm of upper-inner quadrant of right female breast: Secondary | ICD-10-CM

## 2021-09-17 DIAGNOSIS — C50411 Malignant neoplasm of upper-outer quadrant of right female breast: Secondary | ICD-10-CM | POA: Diagnosis not present

## 2021-09-18 ENCOUNTER — Other Ambulatory Visit: Payer: Self-pay | Admitting: General Surgery

## 2021-09-18 ENCOUNTER — Telehealth: Payer: Self-pay | Admitting: Hematology and Oncology

## 2021-09-18 ENCOUNTER — Encounter: Payer: Self-pay | Admitting: *Deleted

## 2021-09-18 DIAGNOSIS — C50211 Malignant neoplasm of upper-inner quadrant of right female breast: Secondary | ICD-10-CM

## 2021-09-18 DIAGNOSIS — Z170 Estrogen receptor positive status [ER+]: Secondary | ICD-10-CM

## 2021-09-18 NOTE — Telephone Encounter (Signed)
Scheduled per 10/14 in basket, pt has been called and confirmed appt 

## 2021-09-28 NOTE — Progress Notes (Signed)
Location of Breast Cancer:  Malignant neoplasm of upper-inner quadrant of right breast, estrogen receptor positive  Histology per Pathology Report:  05/27/2021 Diagnosis Breast, right, needle core biopsy, upper inner - INVASIVE MAMMARY CARCINOMA, SEE COMMENT. - MAMMARY CARCINOMA IN SITU.  Receptor Status: ER(35%), PR (80%), Her2-neu (Negative), Ki-67(60%)  Did patient present with symptoms (if so, please note symptoms) or was this found on screening mammography?: On a routine examination she felt a lump in the right breast which she brought to the attention of her gynecologist.  She underwent a mammogram and ultrasound followed by biopsy. She had a breast MRI (06/01/2021) which did not show any evidence of axillary lymphadenopathy and the tumor on the breast is 4.6 cm.  MRI Breast Bilateral w/ & w/o Contrast 09/03/2021 --IMPRESSION: Interval significant debulking of the known malignancy in the upper inner right breast. The dominant mass now measures 1.0 x 0.4 x 0.6 cm. There remains subtle ill-defined surrounding non mass enhancement, which is somewhat difficult to measure, but overall spans approximately 4.8 x 0.7 x 3.4 cm. No MRI evidence of malignancy in the left breast. No enlarged or abnormal lymph nodes.  Past/Anticipated interventions by surgeon, if any:  10/16/2021 --Dr. Paul Toth Scheduled for RIGHT BREAST LUMPECTOMY WITH RADIOACTIVE SEED X2 AND SENTINEL LYMPH NODE BIOPSY  Past/Anticipated interventions by medical oncology, if any:  Under care of Dr. Vinay Gudena 08/27/2021 --Current treatment: Cycle 4 Taxotere and Cytoxan started 06/26/2021 --Treatment plan: Neoadjuvant chemotherapy with Taxotere and Cytoxan every 3 weeks x4  followed by breast conserving surgery and sentinel lymph node biopsy Adjuvant radiation Followed by adjuvant antiestrogen therapy with complete estrogen blockade --Fertility preservation: Zoladex injections monthly started 06/19/2021 --[facility] nausea  study --Breast MRI scheduled for 09/03/2021 --Return to clinic after surgery to discuss pathology report.  Lymphedema issues, if any:  Patient denies    Pain issues, if any:  Patient denies    SAFETY ISSUES: Prior radiation? No Pacemaker/ICD? No Possible current pregnancy? No--IUD in place Is the patient on methotrexate? No  Current Complaints / other details:  Nothing else of note       

## 2021-09-28 NOTE — Progress Notes (Signed)
Radiation Oncology         [phone] ________________________________  Initial Outpatient Consultation  Name: Amy Pittman MRN: [id number]  Date: 09/29/2021  DOB: [birth year]  YK:DXIPJASN, Real Cons, MD  Nicholas Lose, MD   REFERRING PHYSICIAN: Nicholas Lose, MD  DIAGNOSIS:    ICD-10-CM   1. 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 female who presents today for consideration of radiotherapy in treatment of her newly diagnosed right breast cancer. This past June, the patient palpated a lump in the right breast which she brought to the attention of her gynecologist. Diagnostic mammogram and right breast US performed on 05/21/21 revealed a highly suspicious mass in the superior central right breast seen at 12:30 o'clock, measuring up to 4 cm on [place]. On mammography, associated calcifications were seen, consistent with associated DCIS. On spot views, calcifications were seen to extend lateral to the mass by at least 7 mm, and up to 1.6 cm.   Right breast UIQ biopsy on date of 05/27/21 showed invasive mammary carcinoma measuring 9 mm in the greatest linear extent, and mammary carcinoma in-situ.  ER status: 35% positive, with moderate staining intensity; PR status 80% positive, with strong staining intensity, Her2 status negative; Ki67 60%; Grade 3.  Oncotype DX was obtained on the final surgical sample and the recurrence score of 30 predicts a risk of recurrence outside the breast over the next 9 years of 19%, if the patient's only systemic therapy is an antiestrogen for 5 years.  It also predicts a significant benefit from chemotherapy.  Bilateral  breast MRI on 06/01/21 demonstrated the spiculated mass in the upper-inner quadrant of the right breast to measure 4.3 cm, corresponding with the patient's known invasive and in-situ mammary carcinoma.   The patient's lymph nodes were negative both on ultrasound and MRI.  Accordingly, the patient was then referred to Dr. Lindi Adie on 06/02/21 to discuss treatment options. Following receiving the patients Oncotype results, the patient proceeded with neoadjuvant chemotherapy treatment consisting of Taxotere and Cytoxan (first dose on 06/19/21). The patient is noted by Dr. Lindi Adie to tolerated her chemo treatments extremely well, with her only reported side effect being fatigue.   Bilateral breast MRI on 09/03/21 demonstrated an interval significant debulking of the known malignancy in the upper-inner right breast, measuring 1.0 x 0.4 x 0.6 cm. Also seen was the remaining ill-defined surrounding non-mass enhancement, spanning approximately 4.8 x 0.7 x 3.4 cm. No enlarged/ abnormal lymph nodes were seen. No MRI evidence of malignancy seen within the left breast.  The patient then met with Dr. Marlou Starks for consultation on 09/17/21 to discuss surgery options. Following discussion of the risks and benefits, the patient opted to proceed with right breast lumpectomy, scheduled for 10/16/21.   She is here with her mother today.  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  She has an IUD and denies current pregnancy.  She understands importance of avoiding pregnancy while going through oncologic treatments.  Genetic testing has revealed: Negative hereditary cancer genetic testing: no pathogenic variants detected in [facility] Panel. Variant of uncertain significance in TP53 detected at  c.704A>G (p.Asn235Ser). The report date is May 12, 2021.   PREVIOUS RADIATION THERAPY: No  PAST MEDICAL HISTORY:  has a past medical history of Cancer ([place]), Generalized headaches, Generalized seizures  ([place]) (neurologist--  per pt followed by [place] neurologist), Menorrhagia, and Wears glasses.    PAST SURGICAL HISTORY: Past Surgical History:  Procedure Laterality Date   ANKLE ARTHROSCOPY Right 07/09/2005     dr Mayer Camel  [facility]   w/ Debridement and removal loose body   BIOPSY  12/01/2020   Procedure: BIOPSY;  Surgeon: Jackquline Denmark, MD;  Location: [facility] ENDOSCOPY;  Service: Endoscopy;;   CHOLECYSTECTOMY N/A 12/02/2020   Procedure: LAPAROSCOPIC CHOLECYSTECTOMY WITH CHOLANGIOGRAM;  Surgeon: Erroll Luna, MD;  Location: [place];  Service: General;  Laterality: N/A;   [place] N/A 11/24/2017   Procedure: DILATATION & CURETTAGE/HYSTEROSCOPY;  Surgeon: Bobbye Charleston, MD;  Location: [place];  Service: Gynecology;  Laterality: N/A;   [facility] N/A 12/01/2020   Procedure: ENDOSCOPIC RETROGRADE CHOLANGIOPANCREATOGRAPHY (ERCP);  Surgeon: Jackquline Denmark, MD;  Location[facility] ENDOSCOPY;  Service: Endoscopy;  Laterality: N/A;   IR IMAGING GUIDED PORT INSERTION  06/25/2021   REMOVAL OF STONES  12/01/2020   Procedure: REMOVAL OF STONES;  Surgeon: Jackquline Denmark, MD;  Location: [facility] ENDOSCOPY;  Service: Endoscopy;;   SPHINCTEROTOMY  12/01/2020   Procedure: Joan Mayans;  Surgeon: Jackquline Denmark, MD;  Location: [facility] ENDOSCOPY;  Service: Endoscopy;;    FAMILY HISTORY: family history includes Arthritis in her maternal grandfather; Cancer in her maternal grandfather; Diabetes in her maternal grandfather; Heart disease in her maternal grandfather; Hypertension in her maternal grandfather; Mental illness in her father; Stroke in her maternal grandfather.  SOCIAL HISTORY:  reports that she has never smoked. She has never used smokeless tobacco. She reports that she does not drink alcohol and does not use drugs.  ALLERGIES: Patient has no known allergies.  MEDICATIONS:  Current Outpatient Medications  Medication Sig Dispense Refill   acetaminophen (TYLENOL) 500 MG  tablet TAKE 2 TABLETS (1,000 MG TOTAL) BY MOUTH EVERY SIX HOURS AS NEEDED FOR MILD PAIN OR HEADACHE. 30 tablet 0   levonorgestrel (MIRENA, 52 MG,) 20 MCG/24HR IUD 1 Intra Uterine Device by Intrauterine route once. Every 7 years     lidocaine-prilocaine (EMLA) cream Apply to affected area once 30 g 3   No current facility-administered medications for this encounter.    REVIEW OF SYSTEMS: As above in HPI.   PHYSICAL EXAM:  height is _0  (1.651 m) and weight is 204 lb 3.2 oz (92.6 kg). Her temperature is 97.6 F (36.4 C). Her blood pressure is 115/80 and her pulse is 92. Her respiration is 18 and oxygen saturation is 97%.   General: Alert and oriented, in no acute distress Heart: Regular in rate and rhythm with no murmurs, rubs, or gallops. Chest: Clear to auscultation bilaterally, with no rhonchi, wheezes, or rales. Skin: No concerning lesions. Musculoskeletal: symmetric strength and muscle tone throughout. Neurologic: Cranial nerves II through XII are grossly intact. No obvious focalities. Speech is fluent. Coordination is intact. Psychiatric: Judgment and insight are intact. Affect is appropriate. Breasts: There is thickening in the upper inner quadrant of the right breast but not a discrete mass. No other palpable masses appreciated in the breasts or axillae bilaterally.   ECOG = 0  0 - Asymptomatic (Fully active, able to carry on all predisease activities without restriction)  1 - Symptomatic but completely ambulatory (Restricted in physically strenuous activity but ambulatory and able to  carry out work of a light or sedentary nature. For example, light housework, office work)  2 - Symptomatic, <50% in bed during the day (Ambulatory and capable of all self care but unable to carry out any work activities. Up and about more than 50% of waking hours)  3 - Symptomatic, >50% in bed, but not bedbound (Capable of only limited self-care, confined to bed or chair 50% or more of waking  hours)  4 - Bedbound (Completely disabled. Cannot carry on any self-care. Totally confined to bed or chair)  5 - Death   Eustace Pen MM, Creech RH, Tormey DC, et al. [phone]). "Toxicity and response criteria of the [facility]". [place] Oncol. 5 (6): 649-55   LABORATORY DATA:  Lab Results  Component Value Date   WBC 9.5 08/27/2021   HGB 11.2 (L) 08/27/2021   HCT 33.5 (L) 08/27/2021   MCV 90.3 08/27/2021   PLT 332 08/27/2021   CMP     Component Value Date/Time   NA 143 08/27/2021 0829   K 3.7 08/27/2021 0829   CL 110 08/27/2021 0829   CO2 21 (L) 08/27/2021 0829   GLUCOSE 113 (H) 08/27/2021 0829   BUN 7 08/27/2021 0829   CREATININE 0.65 08/27/2021 0829   CALCIUM 9.7 08/27/2021 0829   PROT 7.3 08/27/2021 0829   ALBUMIN 4.1 08/27/2021 0829   AST 12 (L) 08/27/2021 0829   ALT 15 08/27/2021 0829   ALKPHOS 64 08/27/2021 0829   BILITOT 0.3 08/27/2021 0829   GFRNONAA >60 08/27/2021 0829   GFRAA 159 07/20/2007 1451         RADIOGRAPHY: MR BREAST BILATERAL W WO CONTRAST INC CAD  Result Date: 09/03/2021 CLINICAL DATA:  Evaluation post neoadjuvant treatment of a right breast cancer. LABS:  None. EXAM: BILATERAL BREAST MRI WITH AND WITHOUT CONTRAST TECHNIQUE: Multiplanar, multisequence MR images of both breasts were obtained prior to and following the intravenous administration of 8 ml of Gadavist Three-dimensional MR images were rendered by post-processing of the original MR data on an independent workstation. The three-dimensional MR images were interpreted, and findings are reported in the following complete MRI report for this study. Three dimensional images were evaluated at the independent interpreting workstation using the DynaCAD thin client. COMPARISON:  Previous exam(s). FINDINGS: Breast composition: c. Heterogeneous fibroglandular tissue. Background parenchymal enhancement: Minimal Right breast: There has been interval significant debulking of the known  malignancy in the upper inner right breast. The dominant mass now measures 1.0 x 0.4 x 0.6 cm (series 6, image 55). There remains subtle surrounding non mass enhancement particularly along the superior aspect, which is somewhat difficult to measure, but overall spans approximately 4.8 x 0.7 x 3.4 cm. Left breast: No mass or abnormal enhancement. Lymph nodes: No abnormal appearing lymph nodes. Ancillary findings:  None. IMPRESSION: 1. Interval significant debulking of the known malignancy in the upper inner right breast. The dominant mass now measures 1.0 x 0.4 x 0.6 cm. There remains subtle ill-defined surrounding non mass enhancement, which is somewhat difficult to measure, but overall spans approximately 4.8 x 0.7 x 3.4 cm. 2. No MRI evidence of malignancy in the left breast. 3. No enlarged or abnormal lymph nodes. RECOMMENDATION: Continue treatment plan for known right breast malignancy. BI-RADS CATEGORY  6: Known biopsy-proven malignancy. Electronically Signed   By: Audie Pinto M.D.   On: 09/03/2021 15:27     IMPRESSION/PLAN: This is a very pleasant 35 year old woman with right breast cancer status post neoadjuvant systemic  therapy.  She anticipates breast conserving surgery.  It was a pleasure meeting the patient today. We discussed the risks, benefits, and side effects of radiotherapy. I recommend radiotherapy to the right breast adjuvantly to reduce her risk of locoregional recurrence by 2/3.  We discussed that radiation would take approximately 4 weeks to complete and that I would give the patient a few weeks to heal following surgery before starting treatment planning.  If any of her lymph nodes are pathologically positive she understands she may need 6 weeks of radiation therapy. We spoke about acute effects including skin irritation and fatigue as well as much less common late effects including internal organ injury or irritation. We spoke about the latest technology that is used to minimize the  risk of late effects for patients undergoing radiotherapy to the breast or chest wall.  We spoke about how much work she can realistically balance while going through treatment.  We spoke about the importance of avoiding pregnancy during radiation.  No guarantees of treatment were given. The patient is enthusiastic about proceeding with treatment.  Consent form has been signed.  I look forward to participating in the patient's care.  I will await her referral back to me for postoperative follow-up and eventual CT simulation/treatment planning.  On date of service, in total, I spent 45 minutes on this encounter. Patient was seen in person.   __________________________________________   Eppie Gibson, MD  This document serves as a record of services personally performed by Eppie Gibson, MD. It was created on her behalf by Roney Mans, a trained medical scribe. The creation of this record is based on the scribe's personal observations and the provider's statements to them. This document has been checked and approved by the attending provider.

## 2021-09-29 ENCOUNTER — Ambulatory Visit
Admission: RE | Admit: 2021-09-29 | Discharge: 2021-09-29 | Disposition: A | Discharge status: Home / Self Care | Payer: 59 | Source: Ambulatory Visit | Attending: Radiation Oncology | Admitting: Radiation Oncology

## 2021-09-29 ENCOUNTER — Encounter: Payer: Self-pay | Admitting: Radiation Oncology

## 2021-09-29 ENCOUNTER — Other Ambulatory Visit: Payer: Self-pay

## 2021-09-29 VITALS — BP 115/80 | HR 92 | Temp 97.6°F | Resp 18 | Ht 65.0 in | Wt 204.2 lb

## 2021-09-29 DIAGNOSIS — C50211 Malignant neoplasm of upper-inner quadrant of right female breast: Secondary | ICD-10-CM

## 2021-09-29 DIAGNOSIS — R5383 Other fatigue: Secondary | ICD-10-CM | POA: Insufficient documentation

## 2021-09-29 DIAGNOSIS — Z170 Estrogen receptor positive status [ER+]: Secondary | ICD-10-CM | POA: Diagnosis not present

## 2021-09-29 DIAGNOSIS — G40909 Epilepsy, unspecified, not intractable, without status epilepticus: Secondary | ICD-10-CM | POA: Diagnosis not present

## 2021-09-29 DIAGNOSIS — Z809 Family history of malignant neoplasm, unspecified: Secondary | ICD-10-CM | POA: Diagnosis not present

## 2021-09-29 DIAGNOSIS — Z79899 Other long term (current) drug therapy: Secondary | ICD-10-CM | POA: Insufficient documentation

## 2021-09-30 ENCOUNTER — Encounter: Payer: Self-pay | Admitting: Radiation Oncology

## 2021-10-07 ENCOUNTER — Encounter (HOSPITAL_BASED_OUTPATIENT_CLINIC_OR_DEPARTMENT_OTHER): Payer: Self-pay | Admitting: General Surgery

## 2021-10-08 NOTE — Progress Notes (Signed)

## 2021-10-15 ENCOUNTER — Other Ambulatory Visit: Payer: Self-pay

## 2021-10-15 ENCOUNTER — Ambulatory Visit
Admission: RE | Admit: 2021-10-15 | Discharge: 2021-10-15 | Disposition: A | Discharge status: Home / Self Care | Payer: 59 | Source: Ambulatory Visit | Attending: General Surgery | Admitting: General Surgery

## 2021-10-15 ENCOUNTER — Inpatient Hospital Stay: Payer: 59 | Attending: Hematology and Oncology

## 2021-10-15 VITALS — BP 105/76 | HR 78 | Temp 98.7°F | Resp 18

## 2021-10-15 DIAGNOSIS — Z170 Estrogen receptor positive status [ER+]: Secondary | ICD-10-CM | POA: Diagnosis not present

## 2021-10-15 DIAGNOSIS — C50211 Malignant neoplasm of upper-inner quadrant of right female breast: Secondary | ICD-10-CM | POA: Insufficient documentation

## 2021-10-15 MED ORDER — GOSERELIN ACETATE 3.6 MG ~~LOC~~ IMPL
3.6000 mg | DRUG_IMPLANT | Freq: Once | SUBCUTANEOUS | Status: AC
  Administered 2021-10-15: 3.6 mg via SUBCUTANEOUS
  Filled 2021-10-15: qty 3.6

## 2021-10-15 NOTE — Patient Instructions (Signed)
Goserelin injection °What is this medication? °GOSERELIN (GOE se rel in) is similar to a hormone found in the body. It lowers the amount of sex hormones that the body makes. Men will have lower testosterone levels and women will have lower estrogen levels while taking this medicine. In men, this medicine is used to treat prostate cancer; the injection is either given once per month or once every 12 weeks. A once per month injection (only) is used to treat women with endometriosis, dysfunctional uterine bleeding, or advanced breast cancer. °This medicine may be used for other purposes; ask your health care provider or pharmacist if you have questions. °COMMON BRAND NAME(S): Zoladex, Zoladex 3-Month °What should I tell my care team before I take this medication? °They need to know if you have any of these conditions: °bone problems °diabetes °heart disease °history of irregular heartbeat °an unusual or allergic reaction to goserelin, other medicines, foods, dyes, or preservatives °pregnant or trying to get pregnant °breast-feeding °How should I use this medication? °This medicine is for injection under the skin. It is given by a health care professional in a hospital or clinic setting. °Talk to your pediatrician regarding the use of this medicine in children. Special care may be needed. °Overdosage: If you think you have taken too much of this medicine contact a poison control center or emergency room at once. °NOTE: This medicine is only for you. Do not share this medicine with others. °What if I miss a dose? °It is important not to miss your dose. Call your doctor or health care professional if you are unable to keep an appointment. °What may interact with this medication? °Do not take this medicine with any of the following medications: °cisapride °dronedarone °pimozide °thioridazine °This medicine may also interact with the following medications: °other medicines that prolong the QT interval (an abnormal heart  rhythm) °This list may not describe all possible interactions. Give your health care provider a list of all the medicines, herbs, non-prescription drugs, or dietary supplements you use. Also tell them if you smoke, drink alcohol, or use illegal drugs. Some items may interact with your medicine. °What should I watch for while using this medication? °Visit your doctor or health care provider for regular checks on your progress. Your symptoms may appear to get worse during the first weeks of this therapy. Tell your doctor or healthcare provider if your symptoms do not start to get better or if they get worse after this time. °Your bones may get weaker if you take this medicine for a long time. If you smoke or frequently drink alcohol you may increase your risk of bone loss. A family history of osteoporosis, chronic use of drugs for seizures (convulsions), or corticosteroids can also increase your risk of bone loss. Talk to your doctor about how to keep your bones strong. °This medicine should stop regular monthly menstruation in women. Tell your doctor if you continue to menstruate. °Women should not become pregnant while taking this medicine or for 12 weeks after stopping this medicine. Women should inform their doctor if they wish to become pregnant or think they might be pregnant. There is a potential for serious side effects to an unborn child. Talk to your health care professional or pharmacist for more information. Do not breast-feed an infant while taking this medicine. °Men should inform their doctors if they wish to father a child. This medicine may lower sperm counts. Talk to your health care professional or pharmacist for more information. °This   medicine may increase blood sugar. Ask your healthcare provider if changes in diet or medicines are needed if you have diabetes. °What side effects may I notice from receiving this medication? °Side effects that you should report to your doctor or health care  professional as soon as possible: °allergic reactions like skin rash, itching or hives, swelling of the face, lips, or tongue °bone pain °breathing problems °changes in vision °chest pain °feeling faint or lightheaded, falls °fever, chills °pain, swelling, warmth in the leg °pain, tingling, numbness in the hands or feet °signs and symptoms of high blood sugar such as being more thirsty or hungry or having to urinate more than normal. You may also feel very tired or have blurry vision °signs and symptoms of low blood pressure like dizziness; feeling faint or lightheaded, falls; unusually weak or tired °stomach pain °swelling of the ankles, feet, hands °trouble passing urine or change in the amount of urine °unusually high or low blood pressure °unusually weak or tired °Side effects that usually do not require medical attention (report to your doctor or health care professional if they continue or are bothersome): °change in sex drive or performance °changes in breast size in both males and females °changes in emotions or moods °headache °hot flashes °irritation at site where injected °loss of appetite °skin problems like acne, dry skin °vaginal dryness °This list may not describe all possible side effects. Call your doctor for medical advice about side effects. You may report side effects to FDA at 1-800-FDA-1088. °Where should I keep my medication? °This drug is given in a hospital or clinic and will not be stored at home. °NOTE: This sheet is a summary. It may not cover all possible information. If you have questions about this medicine, talk to your doctor, pharmacist, or health care provider. °© 2022 [employer]/Gold Standard (2019-03-23 00:00:00) ° °

## 2021-10-16 ENCOUNTER — Encounter (HOSPITAL_BASED_OUTPATIENT_CLINIC_OR_DEPARTMENT_OTHER)
Admission: RE | Disposition: A | Discharge status: Home / Self Care | Payer: Self-pay | Source: Home / Self Care | Attending: General Surgery

## 2021-10-16 ENCOUNTER — Ambulatory Visit (HOSPITAL_BASED_OUTPATIENT_CLINIC_OR_DEPARTMENT_OTHER): Payer: 59 | Admitting: Certified Registered"

## 2021-10-16 ENCOUNTER — Ambulatory Visit (HOSPITAL_BASED_OUTPATIENT_CLINIC_OR_DEPARTMENT_OTHER)
Admission: RE | Admit: 2021-10-16 | Discharge: 2021-10-16 | Disposition: A | Discharge status: Home / Self Care | Payer: 59 | Attending: General Surgery | Admitting: General Surgery

## 2021-10-16 ENCOUNTER — Ambulatory Visit
Admission: RE | Admit: 2021-10-16 | Discharge: 2021-10-16 | Disposition: A | Discharge status: Home / Self Care | Payer: 59 | Source: Ambulatory Visit | Attending: General Surgery | Admitting: General Surgery

## 2021-10-16 ENCOUNTER — Encounter (HOSPITAL_BASED_OUTPATIENT_CLINIC_OR_DEPARTMENT_OTHER): Payer: Self-pay | Admitting: General Surgery

## 2021-10-16 ENCOUNTER — Other Ambulatory Visit: Payer: Self-pay

## 2021-10-16 DIAGNOSIS — Z170 Estrogen receptor positive status [ER+]: Secondary | ICD-10-CM | POA: Diagnosis not present

## 2021-10-16 DIAGNOSIS — H00034 Abscess of left upper eyelid: Secondary | ICD-10-CM | POA: Diagnosis not present

## 2021-10-16 DIAGNOSIS — C50211 Malignant neoplasm of upper-inner quadrant of right female breast: Secondary | ICD-10-CM

## 2021-10-16 DIAGNOSIS — C50911 Malignant neoplasm of unspecified site of right female breast: Secondary | ICD-10-CM | POA: Diagnosis not present

## 2021-10-16 DIAGNOSIS — C50411 Malignant neoplasm of upper-outer quadrant of right female breast: Secondary | ICD-10-CM | POA: Insufficient documentation

## 2021-10-16 DIAGNOSIS — G8918 Other acute postprocedural pain: Secondary | ICD-10-CM | POA: Diagnosis not present

## 2021-10-16 DIAGNOSIS — R928 Other abnormal and inconclusive findings on diagnostic imaging of breast: Secondary | ICD-10-CM | POA: Diagnosis not present

## 2021-10-16 HISTORY — PX: BREAST LUMPECTOMY WITH RADIOACTIVE SEED AND SENTINEL LYMPH NODE BIOPSY: SHX6550

## 2021-10-16 HISTORY — PX: BREAST LUMPECTOMY: SHX2

## 2021-10-16 LAB — POCT PREGNANCY, URINE: Preg Test, Ur: NEGATIVE

## 2021-10-16 SURGERY — BREAST LUMPECTOMY WITH RADIOACTIVE SEED AND SENTINEL LYMPH NODE BIOPSY
Anesthesia: General | Site: Breast | Laterality: Right

## 2021-10-16 MED ORDER — ACETAMINOPHEN 500 MG PO TABS
1000.0000 mg | ORAL_TABLET | ORAL | Status: AC
  Administered 2021-10-16: 1000 mg via ORAL

## 2021-10-16 MED ORDER — GABAPENTIN 300 MG PO CAPS
ORAL_CAPSULE | ORAL | Status: AC
  Filled 2021-10-16: qty 1

## 2021-10-16 MED ORDER — LACTATED RINGERS IV SOLN: INTRAVENOUS | Status: DC | PRN

## 2021-10-16 MED ORDER — PROPOFOL 500 MG/50ML IV EMUL
INTRAVENOUS | Status: DC | PRN
  Administered 2021-10-16 (×5): 200 ug/kg/min via INTRAVENOUS

## 2021-10-16 MED ORDER — ACETAMINOPHEN 160 MG/5ML PO SOLN: 1000.0000 mg | Freq: Once | ORAL | Status: DC | PRN

## 2021-10-16 MED ORDER — SILVER NITRATE-POT NITRATE 75-25 % EX MISC
CUTANEOUS | Status: AC
  Filled 2021-10-16: qty 10

## 2021-10-16 MED ORDER — OXYCODONE HCL 5 MG/5ML PO SOLN: 5.0000 mg | Freq: Once | ORAL | Status: DC | PRN

## 2021-10-16 MED ORDER — ACETAMINOPHEN 500 MG PO TABS: 1000.0000 mg | ORAL_TABLET | Freq: Once | ORAL | Status: DC | PRN

## 2021-10-16 MED ORDER — LIDOCAINE HCL (CARDIAC) PF 100 MG/5ML IV SOSY
PREFILLED_SYRINGE | INTRAVENOUS | Status: DC | PRN
  Administered 2021-10-16: 60 mg via INTRATRACHEAL

## 2021-10-16 MED ORDER — FENTANYL CITRATE (PF) 100 MCG/2ML IJ SOLN
INTRAMUSCULAR | Status: AC
  Filled 2021-10-16: qty 2

## 2021-10-16 MED ORDER — CEFAZOLIN SODIUM-DEXTROSE 2-4 GM/100ML-% IV SOLN
2.0000 g | INTRAVENOUS | Status: AC
  Administered 2021-10-16: 2 g via INTRAVENOUS

## 2021-10-16 MED ORDER — CHLORHEXIDINE GLUCONATE CLOTH 2 % EX PADS: 6.0000 | MEDICATED_PAD | Freq: Once | CUTANEOUS | Status: DC

## 2021-10-16 MED ORDER — DEXAMETHASONE SODIUM PHOSPHATE 10 MG/ML IJ SOLN
INTRAMUSCULAR | Status: DC | PRN
  Administered 2021-10-16: 5 mg via INTRAVENOUS

## 2021-10-16 MED ORDER — BUPIVACAINE-EPINEPHRINE (PF) 0.5% -1:200000 IJ SOLN
INTRAMUSCULAR | Status: DC | PRN
  Administered 2021-10-16: 30 mL via PERINEURAL

## 2021-10-16 MED ORDER — FENTANYL CITRATE (PF) 100 MCG/2ML IJ SOLN: 25.0000 ug | INTRAMUSCULAR | Status: DC | PRN

## 2021-10-16 MED ORDER — BUPIVACAINE-EPINEPHRINE (PF) 0.25% -1:200000 IJ SOLN
INTRAMUSCULAR | Status: AC
  Filled 2021-10-16: qty 60

## 2021-10-16 MED ORDER — CEFAZOLIN SODIUM-DEXTROSE 2-3 GM-%(50ML) IV SOLR
INTRAVENOUS | Status: DC | PRN
  Administered 2021-10-16: 2 g via INTRAVENOUS

## 2021-10-16 MED ORDER — PROPOFOL 10 MG/ML IV BOLUS
INTRAVENOUS | Status: DC | PRN
  Administered 2021-10-16: 20 mg via INTRAVENOUS
  Administered 2021-10-16: 180 mg via INTRAVENOUS
  Administered 2021-10-16: 100 mg via INTRAVENOUS

## 2021-10-16 MED ORDER — MIDAZOLAM HCL 2 MG/2ML IJ SOLN
INTRAMUSCULAR | Status: DC | PRN
  Administered 2021-10-16: 2 mg via INTRAVENOUS

## 2021-10-16 MED ORDER — BUPIVACAINE-EPINEPHRINE 0.25% -1:200000 IJ SOLN
INTRAMUSCULAR | Status: DC | PRN
  Administered 2021-10-16: 25 mL

## 2021-10-16 MED ORDER — LIDOCAINE 2% (20 MG/ML) 5 ML SYRINGE
INTRAMUSCULAR | Status: AC
  Filled 2021-10-16: qty 5

## 2021-10-16 MED ORDER — DEXAMETHASONE SODIUM PHOSPHATE 10 MG/ML IJ SOLN
INTRAMUSCULAR | Status: AC
  Filled 2021-10-16: qty 1

## 2021-10-16 MED ORDER — MIDAZOLAM HCL 2 MG/2ML IJ SOLN
2.0000 mg | Freq: Once | INTRAMUSCULAR | Status: AC
  Administered 2021-10-16: 2 mg via INTRAVENOUS

## 2021-10-16 MED ORDER — MAGTRACE LYMPHATIC TRACER
INTRAMUSCULAR | Status: DC | PRN
  Administered 2021-10-16: 2 mL via INTRAMUSCULAR

## 2021-10-16 MED ORDER — CELECOXIB 200 MG PO CAPS
ORAL_CAPSULE | ORAL | Status: AC
  Filled 2021-10-16: qty 1

## 2021-10-16 MED ORDER — BUPIVACAINE HCL (PF) 0.25 % IJ SOLN
INTRAMUSCULAR | Status: AC
  Filled 2021-10-16: qty 60

## 2021-10-16 MED ORDER — HYDROCODONE-ACETAMINOPHEN 5-325 MG PO TABS: 1.0000 | ORAL_TABLET | Freq: Four times a day (QID) | ORAL | 0 refills | Status: DC | PRN

## 2021-10-16 MED ORDER — CELECOXIB 200 MG PO CAPS
200.0000 mg | ORAL_CAPSULE | ORAL | Status: AC
  Administered 2021-10-16: 200 mg via ORAL

## 2021-10-16 MED ORDER — ONDANSETRON HCL 4 MG/2ML IJ SOLN
INTRAMUSCULAR | Status: AC
  Filled 2021-10-16: qty 2

## 2021-10-16 MED ORDER — PROPOFOL 10 MG/ML IV BOLUS
INTRAVENOUS | Status: AC
  Filled 2021-10-16: qty 40

## 2021-10-16 MED ORDER — ACETAMINOPHEN 10 MG/ML IV SOLN: 1000.0000 mg | Freq: Once | INTRAVENOUS | Status: DC | PRN

## 2021-10-16 MED ORDER — FENTANYL CITRATE (PF) 100 MCG/2ML IJ SOLN
INTRAMUSCULAR | Status: DC | PRN
  Administered 2021-10-16 (×2): 25 ug via INTRAVENOUS
  Administered 2021-10-16: 50 ug via INTRAVENOUS
  Administered 2021-10-16 (×8): 25 ug via INTRAVENOUS

## 2021-10-16 MED ORDER — PHENYLEPHRINE HCL (PRESSORS) 10 MG/ML IV SOLN
INTRAVENOUS | Status: DC | PRN
  Administered 2021-10-16: 120 ug via INTRAVENOUS

## 2021-10-16 MED ORDER — MIDAZOLAM HCL 2 MG/2ML IJ SOLN
INTRAMUSCULAR | Status: AC
  Filled 2021-10-16: qty 2

## 2021-10-16 MED ORDER — ONDANSETRON HCL 4 MG/2ML IJ SOLN
INTRAMUSCULAR | Status: DC | PRN
  Administered 2021-10-16: 4 mg via INTRAVENOUS

## 2021-10-16 MED ORDER — GABAPENTIN 300 MG PO CAPS
300.0000 mg | ORAL_CAPSULE | ORAL | Status: AC
  Administered 2021-10-16: 300 mg via ORAL

## 2021-10-16 MED ORDER — 0.9 % SODIUM CHLORIDE (POUR BTL) OPTIME
TOPICAL | Status: DC | PRN
  Administered 2021-10-16: 350 mL

## 2021-10-16 MED ORDER — ACETAMINOPHEN 500 MG PO TABS
ORAL_TABLET | ORAL | Status: AC
  Filled 2021-10-16: qty 2

## 2021-10-16 MED ORDER — LACTATED RINGERS IV SOLN: INTRAVENOUS | Status: DC

## 2021-10-16 MED ORDER — CEFAZOLIN SODIUM-DEXTROSE 2-4 GM/100ML-% IV SOLN
INTRAVENOUS | Status: AC
  Filled 2021-10-16: qty 100

## 2021-10-16 MED ORDER — OXYCODONE HCL 5 MG PO TABS: 5.0000 mg | ORAL_TABLET | Freq: Once | ORAL | Status: DC | PRN

## 2021-10-16 MED ORDER — FENTANYL CITRATE (PF) 100 MCG/2ML IJ SOLN
100.0000 ug | Freq: Once | INTRAMUSCULAR | Status: AC
  Administered 2021-10-16: 50 ug via INTRAVENOUS

## 2021-10-16 SURGICAL SUPPLY — 47 items
ADH SKN CLS APL DERMABOND .7 (GAUZE/BANDAGES/DRESSINGS) ×1
APL PRP STRL LF DISP 70% ISPRP (MISCELLANEOUS) ×1
APPLIER CLIP 9.375 MED OPEN (MISCELLANEOUS) ×2
APR CLP MED 9.3 20 MLT OPN (MISCELLANEOUS) ×1
BLADE SURG 15 STRL LF DISP TIS (BLADE) ×1 IMPLANT
BLADE SURG 15 STRL SS (BLADE) ×2
CANISTER SUC SOCK COL 7IN (MISCELLANEOUS) IMPLANT
CANISTER SUCT 1200ML W/VALVE (MISCELLANEOUS) ×2 IMPLANT
CHLORAPREP W/TINT 26 (MISCELLANEOUS) ×2 IMPLANT
CLIP APPLIE 9.375 MED OPEN (MISCELLANEOUS) ×1 IMPLANT
COVER BACK TABLE 60X90IN (DRAPES) ×2 IMPLANT
COVER MAYO STAND STRL (DRAPES) ×2 IMPLANT
COVER PROBE W GEL 5X96 (DRAPES) ×2 IMPLANT
DECANTER SPIKE VIAL GLASS SM (MISCELLANEOUS) IMPLANT
DERMABOND ADVANCED (GAUZE/BANDAGES/DRESSINGS) ×1
DERMABOND ADVANCED .7 DNX12 (GAUZE/BANDAGES/DRESSINGS) ×1 IMPLANT
DRAPE LAPAROSCOPIC ABDOMINAL (DRAPES) ×2 IMPLANT
DRAPE UTILITY XL STRL (DRAPES) ×2 IMPLANT
ELECT COATED BLADE 2.86 ST (ELECTRODE) ×2 IMPLANT
ELECT REM PT RETURN 9FT ADLT (ELECTROSURGICAL) ×2
ELECTRODE REM PT RTRN 9FT ADLT (ELECTROSURGICAL) ×1 IMPLANT
GLOVE SURG ENC MOIS LTX SZ7.5 (GLOVE) ×2 IMPLANT
GLOVE SURG POLYISO LF SZ6.5 (GLOVE) ×1 IMPLANT
GLOVE SURG UNDER POLY LF SZ6.5 (GLOVE) ×1 IMPLANT
GLOVE SURG UNDER POLY LF SZ7 (GLOVE) ×2 IMPLANT
GOWN STRL REUS W/ TWL LRG LVL3 (GOWN DISPOSABLE) ×2 IMPLANT
GOWN STRL REUS W/TWL LRG LVL3 (GOWN DISPOSABLE) ×4
ILLUMINATOR WAVEGUIDE N/F (MISCELLANEOUS) IMPLANT
KIT MARKER MARGIN INK (KITS) ×2 IMPLANT
LIGHT WAVEGUIDE WIDE FLAT (MISCELLANEOUS) IMPLANT
NDL SAFETY ECLIPSE 18X1.5 (NEEDLE) IMPLANT
NEEDLE HYPO 18GX1.5 SHARP (NEEDLE)
NEEDLE HYPO 25X1 1.5 SAFETY (NEEDLE) ×4 IMPLANT
NS IRRIG 1000ML POUR BTL (IV SOLUTION) ×1 IMPLANT
PACK BASIN DAY SURGERY FS (CUSTOM PROCEDURE TRAY) ×2 IMPLANT
PENCIL SMOKE EVACUATOR (MISCELLANEOUS) ×2 IMPLANT
SLEEVE SCD COMPRESS KNEE MED (STOCKING) ×2 IMPLANT
SPONGE T-LAP 18X18 ~~LOC~~+RFID (SPONGE) ×2 IMPLANT
SUT MON AB 4-0 PC3 18 (SUTURE) ×4 IMPLANT
SUT SILK 2 0 SH (SUTURE) IMPLANT
SUT VICRYL 3-0 CR8 SH (SUTURE) ×2 IMPLANT
SYR CONTROL 10ML LL (SYRINGE) ×2 IMPLANT
TOWEL GREEN STERILE FF (TOWEL DISPOSABLE) ×2 IMPLANT
TRACER MAGTRACE VIAL (MISCELLANEOUS) ×1 IMPLANT
TRAY FAXITRON CT DISP (TRAY / TRAY PROCEDURE) ×2 IMPLANT
TUBE CONNECTING 20X1/4 (TUBING) ×1 IMPLANT
YANKAUER SUCT BULB TIP NO VENT (SUCTIONS) ×2 IMPLANT

## 2021-10-16 NOTE — Anesthesia Procedure Notes (Addendum)
Procedure Name: LMA Insertion Date/Time: 10/16/2021 8:54 AM Performed by: Verita Lamb, CRNA Pre-anesthesia Checklist: Patient identified, Emergency Drugs available, Suction available and Patient being monitored Patient Re-evaluated:Patient Re-evaluated prior to induction Oxygen Delivery Method: Circle system utilized Preoxygenation: Pre-oxygenation with 100% oxygen Induction Type: IV induction Ventilation: Mask ventilation without difficulty LMA: LMA inserted LMA Size: 4.0 Number of attempts: 1 Airway Equipment and Method: Bite block Placement Confirmation: positive ETCO2, breath sounds checked- equal and bilateral and CO2 detector Tube secured with: Tape Dental Injury: Teeth and Oropharynx as per pre-operative assessment

## 2021-10-16 NOTE — Anesthesia Preprocedure Evaluation (Addendum)
Anesthesia Evaluation  Patient identified by MRN, date of birth, ID band Patient awake    Reviewed: Allergy & Precautions, NPO status , Patient's Chart, lab work & pertinent test results  History of Anesthesia Complications Negative for: history of anesthetic complications  Airway Mallampati: III  TM Distance: >3 FB Neck ROM: Full    Dental  (+) Teeth Intact, Dental Advisory Given   Pulmonary neg pulmonary ROS,    breath sounds clear to auscultation       Cardiovascular negative cardio ROS   Rhythm:Regular     Neuro/Psych  Headaches, Seizures -, Well Controlled,  negative psych ROS   GI/Hepatic negative GI ROS, Neg liver ROS,   Endo/Other  negative endocrine ROS  Renal/GU negative Renal ROS     Musculoskeletal negative musculoskeletal ROS (+)   Abdominal   Peds  Hematology negative hematology ROS (+)   Anesthesia Other Findings   Reproductive/Obstetrics Lab Results      Component                Value               Date                      PREGTESTUR               NEGATIVE            10/16/2021                                        Anesthesia Physical Anesthesia Plan  ASA: 2  Anesthesia Plan: General   Post-op Pain Management:    Induction: Intravenous  PONV Risk Score and Plan: 3 and Ondansetron, Dexamethasone, Propofol infusion, TIVA and Midazolam  Airway Management Planned: LMA  Additional Equipment: None  Intra-op Plan:   Post-operative Plan: Extubation in OR  Informed Consent: I have reviewed the patients History and Physical, chart, labs and discussed the procedure including the risks, benefits and alternatives for the proposed anesthesia with the patient or authorized representative who has indicated his/her understanding and acceptance.     Dental advisory given  Plan Discussed with: CRNA and Anesthesiologist  Anesthesia Plan Comments:          Anesthesia Quick Evaluation

## 2021-10-16 NOTE — Interval H&P Note (Signed)
History and Physical Interval Note:  10/16/2021 8:17 AM  Amy Pittman  has presented today for surgery, with the diagnosis of RIGHT BREAST CANCER.  The various methods of treatment have been discussed with the patient and family. After consideration of risks, benefits and other options for treatment, the patient has consented to  Procedure(s): RIGHT BREAST LUMPECTOMY WITH RADIOACTIVE SEED X2 AND SENTINEL LYMPH NODE BIOPSY (Right) as a surgical intervention.  The patient's history has been reviewed, patient examined, no change in status, stable for surgery.  I have reviewed the patient's chart and labs.  Questions were answered to the patient's satisfaction.     Autumn Messing III

## 2021-10-16 NOTE — H&P (Signed)
PROVIDER: Landry Corporal, MD  MRN: [id number] DOB: 09-19-1986 Subjective   Chief Complaint: No chief complaint on file.   History of Present Illness: Amy Pittman is a 35 y.o. female who is seen today for right breast cancer. The patient is a 35 year old white female who was initially seen with a 4.6 cm cancer in the upper portion of the right breast that was ER and PR positive and HER2 negative with a Ki-67 of 60%. She was treated with neoadjuvant chemotherapy and has had a good response. The mass has shrunk to 1 cm but there is still about 4.8 cm of enhancement around the mass. The lymph nodes still look normal. She is now ready to schedule her definitive surgery. She strongly desires breast conservation if possible. Her last dose of chemotherapy was on September 22  Review of Systems: A complete review of systems was obtained from the patient. I have reviewed this information and discussed as appropriate with the patient. See HPI as well for other ROS.  ROS   Medical History: Past Medical History:  Diagnosis Date   History of cancer   Patient Active Problem List  Diagnosis   Malignant neoplasm of upper-outer quadrant of right breast in female, estrogen receptor positive (CMS-HCC)   Past Surgical History:  Procedure Laterality Date   CHOLECYSTECTOMY    No Known Allergies  Current Outpatient Medications on File Prior to Visit  Medication Sig Dispense Refill   acetaminophen (TYLENOL) 500 MG tablet TAKE 2 TABLETS (1,000 MG TOTAL) BY MOUTH EVERY SIX HOURS AS NEEDED FOR MILD PAIN OR HEADACHE.   levonorgestreL (MIRENA 52 MG) 20 mcg/24 hr (8 years) IUD Insert 1 each into the uterus once Follow package directions.   lidocaine-prilocaine (EMLA) cream APPLY TO AFFECTED AREA ONCE AS DIRECTED   No current facility-administered medications on file prior to visit.   History reviewed. No pertinent family history.   Social History   Tobacco Use  Smoking Status Never Smoker   Smokeless Tobacco Never Used    Social History   Socioeconomic History   Marital status: Unknown  Tobacco Use   Smoking status: Never Smoker   Smokeless tobacco: Never Used  Substance and Sexual Activity   Alcohol use: Never   Drug use: Never   Objective:   There were no vitals filed for this visit.  There is no height or weight on file to calculate BMI.  Physical Exam Vitals reviewed.  Constitutional:  General: She is not in acute distress. Appearance: Normal appearance.  HENT:  Head: Normocephalic and atraumatic.  Right Ear: External ear normal.  Left Ear: External ear normal.  Nose: Nose normal.  Mouth/Throat:  Mouth: Mucous membranes are moist.  Pharynx: Oropharynx is clear.  Eyes:  General: No scleral icterus. Extraocular Movements: Extraocular movements intact.  Conjunctiva/sclera: Conjunctivae normal.  Pupils: Pupils are equal, round, and reactive to light.  Cardiovascular:  Rate and Rhythm: Normal rate and regular rhythm.  Pulses: Normal pulses.  Heart sounds: Normal heart sounds.  Pulmonary:  Effort: Pulmonary effort is normal. No respiratory distress.  Breath sounds: Normal breath sounds.  Abdominal:  General: Bowel sounds are normal.  Palpations: Abdomen is soft.  Tenderness: There is no abdominal tenderness.  Musculoskeletal:  General: No swelling, tenderness or deformity. Normal range of motion.  Cervical back: Normal range of motion and neck supple.  Skin: General: Skin is warm and dry.  Coloration: Skin is not jaundiced.  Neurological:  General: No focal deficit present.  Mental Status:  She is alert and oriented to person, place, and time.  Psychiatric:  Mood and Affect: Mood normal.  Behavior: Behavior normal.     Breast: There is no palpable mass in either breast. There is no palpable axillary, supraclavicular, or cervical lymphadenopathy.  Labs, Imaging and Diagnostic Testing:  Assessment and Plan:  Diagnoses and all orders for  this visit:  Malignant neoplasm of upper-outer quadrant of right breast in female, estrogen receptor positive (CMS-HCC) - [facility] Case Posting Request; Future    The patient appears to have had a 4.6 cm cancer in the upper portion of the right breast with clinically negative nodes that has had a good response to neoadjuvant chemotherapy. I have discussed with her the different options and she still strongly favors breast conservation if possible. She understands that there is a slightly higher risk of having a positive margin given the original size of the cancer but it is certainly not unreasonable to try to do this for her. She is still a good candidate for sentinel node biopsy. I have discussed with her in detail the risks and benefits of the operation as well as some of the technical aspects including the use of radioactive seeds to bracket the area and she understands and wishes to proceed.

## 2021-10-16 NOTE — Transfer of Care (Signed)
Immediate Anesthesia Transfer of Care Note  Patient: Amy Pittman  Procedure(s) Performed: RIGHT BREAST LUMPECTOMY WITH RADIOACTIVE SEED X2 AND SENTINEL LYMPH NODE BIOPSY (Right: Breast)  Patient Location: PACU  Anesthesia Type:General  Level of Consciousness: awake, alert  and oriented  Airway & Oxygen Therapy: Patient Spontanous Breathing and Patient connected to face mask oxygen  Post-op Assessment: Report given to RN and Post -op Vital signs reviewed and stable  Post vital signs: Reviewed and stable  Last Vitals:  Vitals Value Taken Time  BP    Temp    Pulse    Resp    SpO2      Last Pain:  Vitals:   10/16/21 0647  TempSrc: Oral  PainSc: 0-No pain         Complications: No notable events documented.

## 2021-10-16 NOTE — Discharge Instructions (Signed)
  Post Anesthesia Home Care Instructions  Activity: Get plenty of rest for the remainder of the day. A responsible individual must stay with you for 24 hours following the procedure.  For the next 24 hours, DO NOT: -Drive a car -Paediatric nurse -Drink alcoholic beverages -Take any medication unless instructed by your physician -Make any legal decisions or sign important papers.  Meals: Start with liquid foods such as gelatin or soup. Progress to regular foods as tolerated. Avoid greasy, spicy, heavy foods. If nausea and/or vomiting occur, drink only clear liquids until the nausea and/or vomiting subsides. Call your physician if vomiting continues.  Special Instructions/Symptoms: Your throat may feel dry or sore from the anesthesia or the breathing tube placed in your throat during surgery. If this causes discomfort, gargle with warm salt water. The discomfort should disappear within 24 hours.  If you had a scopolamine patch placed behind your ear for the management of post- operative nausea and/or vomiting:  1. The medication in the patch is effective for 72 hours, after which it should be removed.  Wrap patch in a tissue and discard in the trash. Wash hands thoroughly with soap and water. 2. You may remove the patch earlier than 72 hours if you experience unpleasant side effects which may include dry mouth, dizziness or visual disturbances. 3. Avoid touching the patch. Wash your hands with soap and water after contact with the patch.     No tylenol until after 1pm today, if needed.

## 2021-10-16 NOTE — Op Note (Signed)
10/16/2021  10:19 AM  PATIENT:  Amy Pittman  35 y.o. female  PRE-OPERATIVE DIAGNOSIS:  RIGHT BREAST CANCER  POST-OPERATIVE DIAGNOSIS:  RIGHT BREAST CANCER  PROCEDURE:  Procedure(s): RIGHT BREAST LUMPECTOMY WITH RADIOACTIVE SEED LOCALIZATION X2 AND DEEP RIGHT AXILLARY SENTINEL LYMPH NODE BIOPSY (Right)  SURGEON:  Surgeon(s) and Role:    * Jovita Kussmaul, MD - Primary  PHYSICIAN ASSISTANT:   ASSISTANTS: none   ANESTHESIA:   local and general  EBL:  Minimal   BLOOD ADMINISTERED:none  DRAINS: none   LOCAL MEDICATIONS USED:  MARCAINE     SPECIMEN:  Source of Specimen:  right breast tissue and sentinel node  DISPOSITION OF SPECIMEN:  PATHOLOGY  COUNTS:  YES  TOURNIQUET:  * No tourniquets in log *  DICTATION: .Dragon Dictation  After informed consent was obtained the patient was brought to the operating room and placed in the supine position on the operating table.  After adequate induction of general anesthesia the patient's right chest, breast, and axillary area were prepped with ChloraPrep, allowed to dry, and draped in usual sterile manner.  An appropriate timeout was performed.  2 cc of magnesium oxide were then injected into the subareolar plexus of the right breast and the breast was massaged for about 5 minutes.  Previously 2 I-125 seeds were placed in the upper portion of the right breast to bracket an area of invasive breast cancer.  Attention was first turned to the right axilla.  The mag trace was able to identify a good signal in the right axilla.  This area was infiltrated with quarter percent Marcaine.  A small transversely oriented incision was made with a 15 blade knife overlying the area of signal.  The incision was then carried through the skin and subcutaneous tissue sharply with the electrocautery until the deep right axillary space was entered.  I could not identify a lymph node where the signal seem to be but in that same general area I did  identify a cluster of several large palpable lymph nodes.  These were excised sharply with the electrocautery and the surrounding small vessels and lymphatics were controlled with clips.  No other hot or palpable nodes were identified in the right axilla.  These nodes were sent as sentinel node #1.  Hemostasis was achieved using the Bovie electrocautery.  The deep layer of the incision was closed with interrupted 3-0 Vicryl stitches.  The skin was closed with a running 4-0 Monocryl subcuticular stitch.  Attention was then turned to the right breast.  The neoprobe was set to I-125 in the area of radioactivity was readily identified in the upper central right breast.  The area around this was infiltrated with quarter percent Marcaine.  I elected to make a curvilinear incision along the upper edge of the areola of the right breast with a 15 blade knife.  The incision was carried through the skin and subcutaneous tissue sharply with the electrocautery.  Dissection was then carried out superiorly between the breast tissue and the subcutaneous fat and skin until the dissection was well beyond the area of radioactivity.  I then removed a large circular portion of breast tissue sharply with the electrocautery around the 2 radioactive seeds while checking the area of radioactivity frequently.  This dissection was carried all the way to the chest wall muscle.  Once this was complete the lumpectomy tissue was removed from the patient.  It was oriented with the appropriate paint colors.  A specimen radiograph was  obtained that showed the clip and 2 seeds to be near the center of the specimen.  The specimen was then sent to pathology for further evaluation.  Hemostasis was achieved using the Bovie electrocautery.  The wound was irrigated with saline and infiltrated with more quarter percent Marcaine.  The deep layer of the wound was then closed with layers of interrupted 3-0 Vicryl stitches.  The skin was then closed with  interrupted 4-0 Monocryl subcuticular stitches.  Dermabond dressings were applied.  The patient tolerated the procedure well.  At the end of the case all needle sponge and instrument counts were correct.  The patient was then awakened and taken to recovery in stable condition.  PLAN OF CARE: Discharge to home after PACU  PATIENT DISPOSITION:  PACU - hemodynamically stable.   Delay start of Pharmacological VTE agent (>24hrs) due to surgical blood loss or risk of bleeding: not applicable

## 2021-10-16 NOTE — Progress Notes (Signed)
Assisted Dr. Moser with right, ultrasound guided, pectoralis block. Side rails up, monitors on throughout procedure. See vital signs in flow sheet. Tolerated Procedure well. 

## 2021-10-16 NOTE — Anesthesia Procedure Notes (Signed)
Anesthesia Regional Block: Pectoralis block   Pre-Anesthetic Checklist: , timeout performed,  Correct Patient, Correct Site, Correct Laterality,  Correct Procedure, Correct Position, site marked,  Risks and benefits discussed,  Surgical consent,  Pre-op evaluation,  At surgeon's request and post-op pain management  Laterality: Left  Prep: chloraprep       Needles:  Injection technique: Single-shot      Needle Length: 9cm  Needle Gauge: 22     Additional Needles: Arrow StimuQuik ECHO Echogenic Stimulating PNB Needle  Procedures:,,,, ultrasound used (permanent image in chart),,    Narrative:  Start time: 10/16/2021 8:24 AM End time: 10/16/2021 8:30 AM Injection made incrementally with aspirations every 5 mL.  Performed by: Personally  Anesthesiologist: Oleta Mouse, MD

## 2021-10-17 NOTE — Anesthesia Postprocedure Evaluation (Signed)
Anesthesia Post Note  Patient: Emunah Texidor Millis  Procedure(s) Performed: RIGHT BREAST LUMPECTOMY WITH RADIOACTIVE SEED X2 AND SENTINEL LYMPH NODE BIOPSY (Right: Breast)     Patient location during evaluation: PACU Anesthesia Type: General and Regional Level of consciousness: awake and alert Pain management: pain level controlled Vital Signs Assessment: post-procedure vital signs reviewed and stable Respiratory status: spontaneous breathing, nonlabored ventilation, respiratory function stable and patient connected to nasal cannula oxygen Cardiovascular status: blood pressure returned to baseline and stable Postop Assessment: no apparent nausea or vomiting Anesthetic complications: no   No notable events documented.  Last Vitals:  Vitals:   10/16/21 1045 10/16/21 1102  BP: 120/69 (!) 144/93  Pulse: 99 99  Resp: (!) 21 19  Temp:  36.8 C  SpO2: 94% 93%    Last Pain:  Vitals:   10/16/21 1102  TempSrc:   PainSc: 0-No pain                 Alec Jaros

## 2021-10-19 ENCOUNTER — Encounter (HOSPITAL_BASED_OUTPATIENT_CLINIC_OR_DEPARTMENT_OTHER): Payer: Self-pay | Admitting: General Surgery

## 2021-10-19 NOTE — Progress Notes (Signed)
Left message stating courtesy call and if any questions or concerns please call the doctors office.  

## 2021-10-19 NOTE — Addendum Note (Signed)
Addendum  created 10/19/21 1215 by Tawni Millers, CRNA   Charge Capture section accepted

## 2021-10-22 ENCOUNTER — Encounter: Payer: Self-pay | Admitting: *Deleted

## 2021-10-22 DIAGNOSIS — Z170 Estrogen receptor positive status [ER+]: Secondary | ICD-10-CM

## 2021-10-26 NOTE — Progress Notes (Signed)
Patient Care Team: Hoyt Koch, MD as PCP - General (Internal Medicine) Frederik Pear, MD (Orthopedic Surgery) Rockwell Germany, RN as Oncology Nurse Navigator Mauro Kaufmann, RN as Oncology Nurse Navigator  DIAGNOSIS:    ICD-10-CM   1. Malignant neoplasm of upper-inner quadrant of right breast in female, estrogen receptor positive ([place])  C50.211    Z17.0       SUMMARY OF ONCOLOGIC HISTORY: Oncology History  Malignant neoplasm of upper-inner quadrant of right breast in female, estrogen receptor positive ([place])  05/27/2021 Initial Diagnosis   Right breast biopsy 05/27/2021: Grade 3 IDC with DCIS ER 35%, PR 80%, Ki-67 60%, HER2 1+ negative,T2 N0 stage IIA Oncotype score: 30: High risk   06/02/2021 Cancer Staging   Staging form: Breast, AJCC 8th Edition - Clinical: Stage IIA (cT2, cN0, cM0, G3, ER+, PR+, HER2-) - Signed by Nicholas Lose, MD on 06/02/2021 Stage prefix: Initial diagnosis Histologic grading system: 3 grade system    06/09/2021 Oncotype testing   Oncotype score 30   06/26/2021 - 08/27/2021 Chemotherapy    Patient is on Treatment Plan: BREAST TC Q21D x4 cycles       10/16/2021 Surgery   Right lumpectomy: Residual grade 2 IDC 0.4 cm, residual DCIS high-grade, margins negative, 0/4 lymph nodes negative, ER 40% weak staining, PR 20% moderate staining, Ki-67 less than 1%, HER2 equal vocal 2+ by IHC, FISH pending, RCB class I     CHIEF COMPLIANT: Follow-up of right breast cancer  INTERVAL HISTORY: Amy Pittman is a 35 y.o. with above-mentioned history of right breast cancer, previously on antiestrogen therapy with tamoxifen which was stopped to Oncotype score showing high risk. Right lumpectomy on 10/16/2021 showed grade 2 residual invasive ductal carcinoma and moderate to high grade residual DCIS. She presents to the clinic today for follow-up and to review the pathology report.   Her major complaint today is soreness under the arm as well as an  abnormal sensation on the lateral aspect of the right arm.  She does not have any pain and has stopped taking pain medications.  ALLERGIES:  has No Known Allergies.  MEDICATIONS:  Current Outpatient Medications  Medication Sig Dispense Refill   acetaminophen (TYLENOL) 500 MG tablet TAKE 2 TABLETS (1,000 MG TOTAL) BY MOUTH EVERY SIX HOURS AS NEEDED FOR MILD PAIN OR HEADACHE. 30 tablet 0   HYDROcodone-acetaminophen (NORCO/VICODIN) 5-325 MG tablet Take 1 tablet by mouth every 6 (six) hours as needed for moderate pain or severe pain. 15 tablet 0   levonorgestrel (MIRENA, 52 MG,) 20 MCG/24HR IUD 1 Intra Uterine Device by Intrauterine route once. Every 7 years     lidocaine-prilocaine (EMLA) cream Apply to affected area once 30 g 3   No current facility-administered medications for this visit.    PHYSICAL EXAMINATION: ECOG PERFORMANCE STATUS: 1 - Symptomatic but completely ambulatory  Vitals:   10/27/21 0950  BP: 131/81  Pulse: 83  Resp: 18  Temp: (!) 97.5 F (36.4 C)  SpO2: 100%   Filed Weights   10/27/21 0950  Weight: 205 lb (93 kg)    BREAST: Right axillary scar appears to be healing very well  LABORATORY DATA:  I have reviewed the data as listed CMP Latest Ref Rng & Units 08/27/2021 08/06/2021 07/16/2021  Glucose 70 - 99 mg/dL 113(H) 91 97  BUN 6 - 20 mg/dL 7 11 14   Creatinine 0.44 - 1.00 mg/dL 0.65 0.70 0.67  Sodium 135 - 145 mmol/L 143 143 141  Potassium  3.5 - 5.1 mmol/L 3.7 3.4(L) 3.5  Chloride 98 - 111 mmol/L 110 108 106  CO2 22 - 32 mmol/L 21(L) 23 24  Calcium 8.9 - 10.3 mg/dL 9.7 9.4 9.1  Total Protein 6.5 - 8.1 g/dL 7.3 7.0 7.2  Total Bilirubin 0.3 - 1.2 mg/dL 0.3 0.4 0.4  Alkaline Phos 38 - 126 U/L 64 54 50  AST 15 - 41 U/L 12(L) 15 14(L)  ALT 0 - 44 U/L 15 18 22     Lab Results  Component Value Date   WBC 9.5 08/27/2021   HGB 11.2 (L) 08/27/2021   HCT 33.5 (L) 08/27/2021   MCV 90.3 08/27/2021   PLT 332 08/27/2021   NEUTROABS 7.8 (H) 08/27/2021     ASSESSMENT & PLAN:  Malignant neoplasm of upper-inner quadrant of right breast in female, estrogen receptor positive ([place]) 05/27/2021:Right breast biopsy 05/27/2021: 4 cm of grade 3 IDC with DCIS ER 35%, PR 80%, Ki-67 60%, HER2 1+ negative,T2 N0 stage IIA Oncotype score: 30: High risk Neoadjuvant chemo with Taxotere and Cytoxan x4 cycles completed 08/27/2021   10/16/2021:Right lumpectomy: Residual grade 2 IDC 0.4 cm, residual DCIS high-grade, margins negative, 0/4 lymph nodes negative, ER 40% weak staining, PR 20% moderate staining, Ki-67 less than 1%, HER2 equal vocal 2+ by IHC, FISH pending, RCB class I  Pathology counseling: I discussed the final pathology report of the patient provided  a copy of this report. I discussed the margins as well as lymph node surgeries. We also discussed the final staging along with previously performed ER/PR and HER-2/neu testing.  Treatment plan: 1. Adjuvant radiation 2. Followed by adjuvant antiestrogen therapy with complete estrogen blockade Fertility preservation: Zoladex injections monthly started 06/19/2021 Patient may decide to stop the Zoladex injections if she wants to get pregnant. We will discuss this when she comes back at the end of radiation.   No orders of the defined types were placed in this encounter.  The patient has a good understanding of the overall plan. she agrees with it. she will call with any problems that may develop before the next visit here.  Total time spent: 30 mins including face to face time and time spent for planning, charting and coordination of care  Rulon Eisenmenger, MD, MPH 10/27/2021  I, Thana Ates, am acting as scribe for Dr. Nicholas Lose.  I have reviewed the above documentation for accuracy and completeness, and I agree with the above.

## 2021-10-27 ENCOUNTER — Encounter: Payer: Self-pay | Admitting: *Deleted

## 2021-10-27 ENCOUNTER — Inpatient Hospital Stay: Payer: 59 | Admitting: Hematology and Oncology

## 2021-10-27 ENCOUNTER — Other Ambulatory Visit: Payer: Self-pay

## 2021-10-27 DIAGNOSIS — C50211 Malignant neoplasm of upper-inner quadrant of right female breast: Secondary | ICD-10-CM

## 2021-10-27 DIAGNOSIS — Z170 Estrogen receptor positive status [ER+]: Secondary | ICD-10-CM | POA: Diagnosis not present

## 2021-10-27 NOTE — Assessment & Plan Note (Signed)
05/27/2021:Right breast biopsy 05/27/2021: Grade 3 IDC with DCIS ER 35%, PR 80%, Ki-67 60%, HER2 1+ negative,T2 N0 stage IIA Oncotype score: 30: High risk Neoadjuvant chemo with Taxotere and Cytoxan x4 cycles completed 08/27/2021   10/16/2021:Right lumpectomy: Residual grade 2 IDC 0.4 cm, residual DCIS high-grade, margins negative, 0/4 lymph nodes negative, ER 40% weak staining, PR 20% moderate staining, Ki-67 less than 1%, HER2 equal vocal 2+ by IHC, FISH pending, RCB class I  Pathology counseling: I discussed the final pathology report of the patient provided  a copy of this report. I discussed the margins as well as lymph node surgeries. We also discussed the final staging along with previously performed ER/PR and HER-2/neu testing.  Treatment plan: 1. Adjuvant radiation 2.Followed by adjuvant antiestrogen therapy with complete estrogen blockade Fertility preservation: Zoladex injections monthly started 06/19/2021

## 2021-11-02 ENCOUNTER — Other Ambulatory Visit: Payer: Self-pay

## 2021-11-02 ENCOUNTER — Encounter: Payer: Self-pay | Admitting: Physical Therapy

## 2021-11-02 ENCOUNTER — Ambulatory Visit: Payer: 59 | Attending: Hematology and Oncology | Admitting: Physical Therapy

## 2021-11-02 DIAGNOSIS — R293 Abnormal posture: Secondary | ICD-10-CM | POA: Insufficient documentation

## 2021-11-02 DIAGNOSIS — Z170 Estrogen receptor positive status [ER+]: Secondary | ICD-10-CM | POA: Diagnosis not present

## 2021-11-02 DIAGNOSIS — Z483 Aftercare following surgery for neoplasm: Secondary | ICD-10-CM | POA: Diagnosis not present

## 2021-11-02 DIAGNOSIS — C50211 Malignant neoplasm of upper-inner quadrant of right female breast: Secondary | ICD-10-CM | POA: Diagnosis not present

## 2021-11-02 LAB — SURGICAL PATHOLOGY

## 2021-11-02 NOTE — Therapy (Signed)
[facility] @ [address] [place], [place], [address] Phone: [phone]   Fax:  [phone]  Physical Therapy Treatment  Patient Details  Name: Amy Pittman MRN: [id number] Date of Birth: 1986/11/27 Referring Provider (PT): Dr. Autumn Messing   Encounter Date: 11/02/2021   PT End of Session - 11/02/21 1049     Visit Number 2    Number of Visits 2    PT Start Time 4818    PT Stop Time 1049    PT Time Calculation (min) 47 min    Activity Tolerance Patient tolerated treatment well    Behavior During Therapy [facility] for tasks assessed/performed             Past Medical History:  Diagnosis Date   Cancer [facility])    Generalized headaches    Generalized seizures [facility]) neurologist--  per pt followed by [place] neurologist   per pt dx 2010 and per pt none since ,did have work-up negative , felt she was going to sleep Abscent seizure  and triggers are stress and exhaustion   Menorrhagia    Wears glasses     Past Surgical History:  Procedure Laterality Date   ANKLE ARTHROSCOPY Right 07/09/2005     dr Mayer Camel  [facility]   w/ Debridement and removal loose body   BIOPSY  12/01/2020   Procedure: BIOPSY;  Surgeon: Jackquline Denmark, MD;  Location: [facility] ENDOSCOPY;  Service: Endoscopy;;   BREAST LUMPECTOMY WITH RADIOACTIVE SEED AND SENTINEL LYMPH NODE BIOPSY Right 10/16/2021   Procedure: RIGHT BREAST LUMPECTOMY WITH RADIOACTIVE SEED X2 AND SENTINEL LYMPH NODE BIOPSY;  Surgeon: Jovita Kussmaul, MD;  Location: [place];  Service: General;  Laterality: Right;   CHOLECYSTECTOMY N/A 12/02/2020   Procedure: LAPAROSCOPIC CHOLECYSTECTOMY WITH CHOLANGIOGRAM;  Surgeon: Erroll Luna, MD;  Location: [place];  Service: General;  Laterality: N/A;   [place] N/A 11/24/2017   Procedure: DILATATION & CURETTAGE/HYSTEROSCOPY;  Surgeon: Bobbye Charleston, MD;  Location: [place];   Service: Gynecology;  Laterality: N/A;   ERCP N/A 12/01/2020   Procedure: ENDOSCOPIC RETROGRADE CHOLANGIOPANCREATOGRAPHY (ERCP);  Surgeon: Jackquline Denmark, MD;  Location: [facility] ENDOSCOPY;  Service: Endoscopy;  Laterality: N/A;   IR IMAGING GUIDED PORT INSERTION  06/25/2021   REMOVAL OF STONES  12/01/2020   Procedure: REMOVAL OF STONES;  Surgeon: Jackquline Denmark, MD;  Location: [facility] ENDOSCOPY;  Service: Endoscopy;;   SPHINCTEROTOMY  12/01/2020   Procedure: Joan Mayans;  Surgeon: Jackquline Denmark, MD;  Location: [facility] ENDOSCOPY;  Service: Endoscopy;;    There were no vitals filed for this visit.   Subjective Assessment - 11/02/21 1006     Subjective Patient underwent neoadjuvant chemotherapy 06/26/2021 - 08/27/2021. Then she had a right lumpectomy and sentinel node biopsy (4 negative nodes) on 10/16/2021. She will undergo radiation and anti-estrogen therapy.    Pertinent History Patient reports she was diagnosed on 05/27/2021 with right grade III invasive ductal carcinoma breast cancer. She underwent neoadjuvant chemotherapy 06/26/2021 - 08/27/2021. Then she had a right lumpectomy and sentinel node biopsy (4 negative nodes) on 10/16/2021. It is ER/PR positive and HER2 negative with a Ki67 of 60%. She has a hx of a seizure disorder but no seizures since 2010.    Limitations Standing    Patient Stated Goals See if my arm is doing well    Currently in Pain? Yes    Pain Score 3     Pain Location Arm  Pain Orientation Right;Posterior    Pain Descriptors / Indicators Tingling    Pain Type Surgical pain    Pain Onset 1 to 4 weeks ago    Pain Frequency Intermittent    Aggravating Factors  Using arm    Pain Relieving Factors Nothing                [facility] PT Assessment - 11/02/21 0001       Assessment   Medical Diagnosis Right breast cancer s/p lumpectomy    Referring Provider (PT) Dr. Autumn Messing    Onset Date/Surgical Date 10/16/21    Hand Dominance Right    Prior Therapy Baselines      Precautions    Precautions Other (comment)    Precaution Comments Right arm lymphedema risk      Restrictions   Weight Bearing Restrictions No      Balance Screen   Has the patient fallen in the past 6 months No    Has the patient had a decrease in activity level because of a fear of falling?  No    Is the patient reluctant to leave their home because of a fear of falling?  No      Home Social worker Private residence    Living Arrangements Parent    Available Help at Discharge Family      Prior Function   Level of Independence Independent    Vocation Full time employment    Conservation officer, nature    Leisure She is not exercising      Cognition   Overall Cognitive Status Within Functional Limits for tasks assessed      Observation/Other Assessments   Observations Right axillary and breast incisions appear to be healing well. Mild edema present superior to her axillary incision. BReast incision is still healing but no redness or significant edema noted.      Posture/Postural Control   Posture/Postural Control Postural limitations    Postural Limitations Rounded Shoulders;Forward head      ROM / Strength   AROM / PROM / Strength AROM;Strength      AROM   AROM Assessment Site Shoulder    Right/Left Shoulder Right;Left    Right Shoulder Extension 59 Degrees    Right Shoulder Flexion 139 Degrees    Right Shoulder ABduction 151 Degrees    Right Shoulder Internal Rotation 80 Degrees    Right Shoulder External Rotation 88 Degrees               LYMPHEDEMA/ONCOLOGY QUESTIONNAIRE - 11/02/21 0001       Type   Cancer Type Right breast cancer      Surgeries   Lumpectomy Date 10/16/21    Sentinel Lymph Node Biopsy Date 10/16/21    Number Lymph Nodes Removed 4      Treatment   Active Chemotherapy Treatment No    Past Chemotherapy Treatment Yes    Date 08/27/21    Active Radiation Treatment No    Past Radiation Treatment No     Current Hormone Treatment Yes    Date 06/19/21    Drug Name Zoladex    Past Hormone Therapy No      What other symptoms do you have   Are you Having Heaviness or Tightness No    Are you having Pain Yes    Are you having pitting edema No    Is it Hard or Difficult finding clothes that fit No  Do you have infections No    Is there Decreased scar mobility Yes    Stemmer Sign No      Lymphedema Assessments   Lymphedema Assessments Upper extremities      Right Upper Extremity Lymphedema   10 cm Proximal to Olecranon Process 31.5 cm    Olecranon Process 25.5 cm    10 cm Proximal to Ulnar Styloid Process 22.2 cm    Just Proximal to Ulnar Styloid Process 16.2 cm    Across Hand at [employer] 19.5 cm    At [place] of 2nd Digit 6.6 cm      Left Upper Extremity Lymphedema   10 cm Proximal to Olecranon Process 32 cm    Olecranon Process 25.9 cm    10 cm Proximal to Ulnar Styloid Process 22.2 cm    Just Proximal to Ulnar Styloid Process 16.3 cm    Across Hand at [employer] 20 cm    At [place] of 2nd Digit 6.5 cm                Quick Dash - 11/02/21 0001     Open a tight or new jar Moderate difficulty    Do heavy household chores (wash walls, wash floors) No difficulty    Carry a shopping bag or briefcase No difficulty    Wash your back Mild difficulty    Use a knife to cut food No difficulty    Recreational activities in which you take some force or impact through your arm, shoulder, or hand (golf, hammering, tennis) Mild difficulty    During the past week, to what extent has your arm, shoulder or hand problem interfered with your normal social activities with family, friends, neighbors, or groups? Not at all    During the past week, to what extent has your arm, shoulder or hand problem limited your work or other regular daily activities Not at all    Arm, shoulder, or hand pain. Mild    Tingling (pins and needles) in your arm, shoulder, or hand Mild    Difficulty  Sleeping Mild difficulty    DASH Score 15.91 %                             PT Education - 11/02/21 1042     Education Details Lymphedema education and aftercare; scar massage    Person(s) Educated Patient;Parent(s)    Methods Explanation;Demonstration;Handout    Comprehension Returned demonstration;Verbalized understanding                 PT Long Term Goals - 11/02/21 1052       PT LONG TERM GOAL #1   Title Patient will demonstrate she has regainde full shoulder ROM and function post operatively compared to baselines.    Time 6    Period Months    Status Partially Met                   Plan - 11/02/21 1049     Clinical Impression Statement Patient is doing very well s/p right lumpectomy and sentinel node biopsy (4 negative nodes) on 10/16/2021. She has not seen her surgeon for a f/u visit yet but her shoulder ROM is nearly back to baseline, there are no signs of lymphedema, and her incisions appear to be healing well. There is mild edema present just superior to her axillary incision but no signs of infection. She was  given additional exericses today to regian the last 10 degrees of shoulder flexion and abduction but otherwise has no need for PT at this time. he will attend the After Breast Cancer class on 11/09/2021 for lymphedema risk reduction education.    PT Treatment/Interventions ADLs/Self Care Home Management;Therapeutic exercise;Patient/family education    PT Next Visit Plan D/C    PT Home Exercise Plan Post op HEP and closed chain exercises    Consulted and Agree with Plan of Care Patient;Family member/caregiver    Family Member Consulted Mother             Patient will benefit from skilled therapeutic intervention in order to improve the following deficits and impairments:  Postural dysfunction, Decreased range of motion, Decreased knowledge of precautions, Impaired UE functional use, Pain  Visit Diagnosis: Malignant neoplasm of  upper-inner quadrant of right breast in female, estrogen receptor positive ([place])  Abnormal posture  Aftercare following surgery for neoplasm     Problem List Patient Active Problem List   Diagnosis Date Noted   Genetic testing 08/06/2021   Malignant neoplasm of upper-inner quadrant of right breast in female, estrogen receptor positive ([place]) 06/02/2021   RUQ abdominal pain    Gallstones    Choledocholithiasis    Pancreatitis 11/29/2020   Cellulitis of left upper eyelid 10/11/2018   Low back pain 05/28/2015   Awareness alteration, transient 03/07/2015   Routine health maintenance 05/10/2012   Generalized seizures ([place])    Irregular menses    PHYSICAL THERAPY DISCHARGE SUMMARY  Visits from Start of Care: 2  Current functional level related to goals / functional outcomes: Goals met except slightly limited with shoulder flexion and abduction.   Remaining deficits: None   Education / Equipment: HEP and lymphedema education  Patient agrees to discharge. Patient goals were partially met. Patient is being discharged due to being pleased with the current functional level.  Annia Friendly, [place] 11/02/21 10:53 AM    [facility] @ [address], [place], [address] Phone: [phone]   Fax:  [phone]  Name: Amy Pittman MRN: [id number] Date of Birth: Jul 03, 1986

## 2021-11-02 NOTE — Patient Instructions (Addendum)
     [facility]  [address], [address]  [place] [address]  [phone]  After Breast Cancer Class It is recommended you attend the ABC class to be educated on lymphedema risk reduction. This class is free of charge and lasts for 1 hour. It is a 1-time class. You will need to download the Webex app either on your phone or computer. We will send you a link the night before or the morning of the class. You should be able to click on that link to join the class. This is not a confidential class. You don't have to turn your camera on, but other participants may be able to see your email address. You are scheduled for December 5th at 11:00.  Scar massage You can begin gentle scar massage to you incision sites (wait to work on the one around your nipple until you see Dr. Marlou Starks). Gently place one hand on the incision and move the skin (without sliding on the skin) in various directions. Do this for a few minutes and then you can gently massage either coconut oil or vitamin E cream into the scars.  Compression garment You should continue wearing your compression bra until you feel like you no longer have swelling.  Home exercise Program Continue doing the exercises you were given until you feel like you can do them without feeling any tightness at the end.   Walking Program Studies show that 30 minutes of walking per day (fast enough to elevate your heart rate) can significantly reduce the risk of a cancer recurrence. If you can't walk due to other medical reasons, we encourage you to find another activity you could do (like a stationary bike or water exercise).  Posture After breast cancer surgery, people frequently sit with rounded shoulders posture because it puts their incisions on slack and feels better. If you sit like this and scar tissue forms in that position, you can become very tight and have pain sitting or standing with good posture. Try to be aware of your  posture and sit and stand up tall to heal properly.  Follow up PT: It is recommended you return every 3 months for the first 3 years following surgery to be assessed on the SOZO machine for an L-Dex score. This helps prevent clinically significant lymphedema in 95% of patients. These follow up screens are 10 minute appointments that you are not billed for. You are scheduled for 9:30 on February 13th, 2023.  Closed Chain: Shoulder Abduction / Adduction - on Wall    One hand on wall, step to side and return. Stepping causes shoulder to abduct and adduct. Step _5__ times, holding 5 seconds, 2_ times per day.  http://ss.exer.us/267   Copyright  VHI. All rights reserved.  Closed Chain: Shoulder Flexion / Extension - on Wall    Hands on wall, step backward. Return. Stepping causes shoulder flexion and extension Step _5__ times, holding 5 seconds, 2_ times per day.  http://ss.exer.us/265   Copyright  VHI. All rights reserved.

## 2021-11-05 ENCOUNTER — Encounter: Payer: Self-pay | Admitting: *Deleted

## 2021-11-05 NOTE — Progress Notes (Signed)
Radiation Oncology         [phone] ________________________________  Name: Amy Pittman MRN: [id number]  Date: 11/06/2021  DOB: 13-Jan-1986  Follow-Up New Visit Note  Outpatient  CC: Hoyt Koch, MD  Nicholas Lose, MD  Diagnosis:      ICD-10-CM   1. 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  Systemic therapy, if applicable, involved (dates and therapy as follows): the patient received chemotherapy consisting of Taxotere and Cytoxan under the care of Dr. Lindi Adie on 06/26/21 through 08/27/21.   The patient has also been receiving goserelin injections once monthly, last dose on 10/15/21.  Symptomatically, the patient reports:  Lymphedema issues, if any:  Patient denies; Had PT evaluation on 11/02/2021. Will follow up with PT on 01/18/2021 for reassessment    Pain issues, if any:  Patient denies. Reports lingering tinging sensation to her right arm since the surgery,  but  denies any range of motion issues or other concerns  SAFETY ISSUES: Prior radiation? No Pacemaker/ICD? No Possible current pregnancy? No--IUD in place Is the patient on methotrexate? No  Current Complaints / other details:  Nothing else of note         ALLERGIES:  has No Known Allergies.  Meds: Current Outpatient Medications  Medication Sig Dispense Refill   acetaminophen (TYLENOL) 500 MG tablet TAKE 2 TABLETS (1,000 MG TOTAL) BY MOUTH EVERY SIX HOURS AS NEEDED FOR MILD PAIN OR HEADACHE. 30 tablet 0   levonorgestrel (MIRENA, 52 MG,) 20 MCG/24HR IUD 1 Intra Uterine Device by Intrauterine route once. Every 7 years     lidocaine-prilocaine (EMLA) cream Apply to affected area once 30 g 3   No current facility-administered medications for this encounter.    Physical Findings:  height is 5' 5" (1.651 m) and weight is 209 lb 4 oz (94.9 kg). Her temporal temperature is 97.2 F (36.2 C) (abnormal). Her blood pressure is 130/86 and her pulse is 80. Her respiration is 18 and oxygen saturation is 98%. .     General: Alert and oriented, in no acute distress Heart: Regular in rate and rhythm with no murmurs, rubs, or gallops. Chest: Clear to auscultation bilaterally, with no rhonchi, wheezes, or rales.  Left Port-A-Cath in place Psychiatric: Judgment and insight are intact. Affect is appropriate. Breast exam reveals satisfactory healing of right axillary and right lumpectomy surgical scars.  No sign of excessive edema in breast. EXT: Good range of motion in right shoulder  Lab Findings: Lab  Results  Component Value Date   WBC 9.5 08/27/2021   HGB 11.2 (L) 08/27/2021   HCT 33.5 (L) 08/27/2021   MCV 90.3 08/27/2021   PLT 332 08/27/2021      Radiographic Findings: MM Breast Surgical Specimen  Result Date: 10/16/2021 CLINICAL DATA:  Specimen radiograph status post right breast lumpectomy. EXAM: SPECIMEN RADIOGRAPH OF THE right BREAST COMPARISON:  Previous exam(s). FINDINGS: Status  post excision of the right breast. The 2 radioactive seeds and the ribbon shaped biopsy marker clip are and completely intact within the specimen. These findings were communicated to the OR at 10 o'clock a.m. IMPRESSION: Specimen radiograph of the right breast. Electronically Signed   By: Ammie Ferrier M.D.   On: 10/16/2021 10:01  MM RT RADIOACTIVE SEED LOC MAMMO GUIDE  Result Date: 10/15/2021 CLINICAL DATA:  Pre lumpectomy bracketed localization of a 4.8 cm area of invasive mammary carcinoma and mammary carcinoma in situ in the 12:30 o'clock region of the right breast EXAM: MAMMOGRAPHIC GUIDED RADIOACTIVE SEED LOCALIZATION OF THE RIGHT BREAST X 2 COMPARISON:  Previous examinations, including the breast MRI dated 09/03/2021 FINDINGS: Patient presents for radioactive seed localization prior to right lumpectomy. I met with the patient and we discussed the procedure of seed localization including benefits and alternatives. We discussed the high likelihood of a successful procedure. We discussed the risks of the procedure including infection, bleeding, tissue injury and further surgery. We discussed the low dose of radioactivity involved in the procedure. Informed, written consent was given. The usual time-out protocol was performed immediately prior to the procedure. SEED 1: ANTERIOR ASPECT OF AREA OF MALIGNANCY WITH DISTORTION IN THE UPPER RIGHT BREAST Using mammographic guidance, sterile technique, 1% lidocaine and an I-125 radioactive seed, the anterior aspect of the area of malignancy with distortion in the upper right breast, containing a ribbon shaped biopsy marker clip and calcifications was localized using a cephalad approach. The follow-up mammogram images confirm the seed in the expected location and were marked for Dr. Marlou Starks. Follow-up survey of the patient confirms presence of the radioactive seed. Order number of I-125 seed:  [id number]. Total activity:  0.240 mCi reference Date: 09/01/2021 SEED 2:  POSTERIOR ASPECT OF THE AREA OF MALIGNANCY WITH DISTORTION IN THE UPPER RIGHT BREAST Using mammographic guidance, sterile technique, 1% lidocaine and an I-125 radioactive seed, the posterior aspect of the area of malignancy with distortion in the upper right breast, containing a ribbon shaped biopsy marker clip and calcifications was localized using a cephalad approach. The follow-up mammogram images confirm the seed in the expected location and were marked for Dr. Marlou Starks. Follow-up survey of the patient confirms presence of the radioactive seed. Order number of I-125 seed:  [id number]. Total activity:  0.240 mCi reference Date: 09/01/2021 The patient tolerated the procedure well and was released from the [facility]. She was given instructions regarding seed removal. IMPRESSION: 1. Radioactive seed localization right breast x 2. No apparent complications. 2. The postprocedure mammogram images demonstrate calcifications suspicious for DCIS extending 2 cm posterior to the more posterior seed. The seeds are located 3.5 cm apart. Electronically Signed   By: Claudie Revering M.D.   On: 10/15/2021 14:51  MM RT RADIO SEED EA ADD LESION LOC MAMMO  Result Date: 10/15/2021 CLINICAL DATA:  Pre lumpectomy bracketed localization of a 4.8 cm area of invasive mammary carcinoma and mammary carcinoma in situ in the 12:30 o'clock region of the right breast EXAM: MAMMOGRAPHIC GUIDED RADIOACTIVE SEED LOCALIZATION OF THE RIGHT BREAST X 2  COMPARISON:  Previous examinations, including the breast MRI dated 09/03/2021 FINDINGS: Patient presents for radioactive seed localization prior to right lumpectomy. I met with the patient and we discussed the procedure of seed localization including benefits and alternatives. We discussed the high likelihood of a successful procedure. We discussed the risks of the procedure including infection, bleeding, tissue injury and further surgery. We discussed the low dose of radioactivity involved in the  procedure. Informed, written consent was given. The usual time-out protocol was performed immediately prior to the procedure. SEED 1: ANTERIOR ASPECT OF AREA OF MALIGNANCY WITH DISTORTION IN THE UPPER RIGHT BREAST Using mammographic guidance, sterile technique, 1% lidocaine and an I-125 radioactive seed, the anterior aspect of the area of malignancy with distortion in the upper right breast, containing a ribbon shaped biopsy marker clip and calcifications was localized using a cephalad approach. The follow-up mammogram images confirm the seed in the expected location and were marked for Dr. Marlou Starks. Follow-up survey of the patient confirms presence of the radioactive seed. Order number of I-125 seed:  [id number]. Total activity:  0.240 mCi reference Date: 09/01/2021 SEED 2: POSTERIOR ASPECT OF THE AREA OF MALIGNANCY WITH DISTORTION IN THE UPPER RIGHT BREAST Using mammographic guidance, sterile technique, 1% lidocaine and an I-125 radioactive seed, the posterior aspect of the area of malignancy with distortion in the upper right breast, containing a ribbon shaped biopsy marker clip and calcifications was localized using a cephalad approach. The follow-up mammogram images confirm the seed in the expected location and were marked for Dr. Marlou Starks. Follow-up survey of the patient confirms presence of the radioactive seed. Order number of I-125 seed:  [id number]. Total activity:  0.240 mCi reference Date: 09/01/2021 The patient tolerated the procedure well and was released from the [facility]. She was given instructions regarding seed removal. IMPRESSION: 1. Radioactive seed localization right breast x 2. No apparent complications. 2. The postprocedure mammogram images demonstrate calcifications suspicious for DCIS extending 2 cm posterior to the more posterior seed. The seeds are located 3.5 cm apart. Electronically Signed   By: Claudie Revering M.D.   On: 10/15/2021 14:51   Impression/Plan: Right breast cancer  We discussed  adjuvant radiotherapy today.  I recommend adjuvant radiation to the right breast over 4 weeks in order to reduce risk of local regional recurrence by two thirds.  I reviewed the logistics, benefits, risks, and potential side effects of this treatment in detail. Risks may include but not necessary be limited to acute and late injury tissue in the radiation fields such as skin irritation (change in color/pigmentation, itching, dryness, pain, peeling). She may experience fatigue.  Internal organ injury is much less common.  The patient asked good questions which I answered to her satisfaction. She is enthusiastic about proceeding with treatment. A consent form had been previously signed and placed in her chart.  We again reviewed the contents of that consent form today so that potential acute and long-term risks and side effects are fresh in her mind.   Anticipate treatment planning to take place next week and treatment to start the week thereafter.  On date of service, in total, I spent 30 minutes on this encounter. Patient was seen in person.  _____________________________________   Eppie Gibson, MD  This document serves as a record of services personally performed by Eppie Gibson, MD. It was created on her behalf by Roney Mans, a trained medical scribe. The creation of this record is based on the scribe's personal observations and the provider's  statements to them. This document has been checked and approved by the attending provider.  

## 2021-11-05 NOTE — Progress Notes (Signed)
Location of Breast Cancer:  Malignant neoplasm of upper-inner quadrant of right breast, estrogen receptor positive  Histology per Pathology Report:  10/16/2021 FINAL MICROSCOPIC DIAGNOSIS:  A. LYMPH NODE, RIGHT AXILLARY, SENTINEL, EXCISION:  - 1 lymph node, negative for carcinoma (0/1).  B. LYMPH NODE, RIGHT AXILLARY, SENTINEL, EXCISION:  - 1 lymph node, negative for carcinoma (0/1).  C. LYMPH NODE, RIGHT AXILLARY, SENTINEL, EXCISION:  - 1 lymph node, negative for carcinoma (0/1).  D. LYMPH NODE, RIGHT AXILLARY, SENTINEL, EXCISION:  - 1 lymph node, negative for carcinoma (0/1).  E. BREAST, RIGHT, LUMPECTOMY:  - Residual invasive ductal carcinoma, 0.4 cm in maximal extent, grade 2.  - Invasive tumor comes to 0.5 cm of closest (lateral) resection margin.  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  - In situ carcinoma comes to 0.1 cm of closest (deep) margin.  - Biopsy site changes.  Receptor Status: ER(35%), PR (80%), Her2-neu (Negative via IHC), Ki-67(60%)  Did patient present with symptoms (if so, please note symptoms) or was this found on screening mammography?: On a routine examination she felt a lump in the right breast which she brought to the attention of her gynecologist.  She underwent a mammogram and ultrasound followed by biopsy. She had a breast MRI (06/01/2021) which did not show any evidence of axillary lymphadenopathy and the tumor on the breast is 4.6 cm.  Past/Anticipated interventions by surgeon, if any:  11/04/2021 --Dr. Paul Toth (office visit) The patient is about 2 weeks status post right breast lumpectomy for breast cancer.  She tolerated the surgery well.  At this point she will follow-up with medical and radiation oncology for adjuvant treatments. She would also like to have her port removed.  I have discussed with her in detail the risks and benefits of the operation as well as some of the technical aspects and she understands and wishes to  proceed.  Otherwise I will plan to see her back in about 6 months.  10/16/2021 --Dr. Paul Toth RIGHT BREAST LUMPECTOMY WITH RADIOACTIVE SEED LOCALIZATION X2  DEEP RIGHT AXILLARY SENTINEL LYMPH NODE BIOPSY  Past/Anticipated interventions by medical oncology, if any:  Under care of Dr. Vinay Gudena 10/27/2021 --Treatment Plan:  BREAST TC Q21D x4 cycles (final cycle 08/27/2021) Adjuvant radiation Followed by adjuvant antiestrogen therapy with complete estrogen blockade --Fertility preservation: Zoladex injections monthly started 06/19/2021 Patient may decide to stop the Zoladex injections if she wants to get pregnant. We will discuss this when she comes back at the end of radiation.  Lymphedema issues, if any:  Patient denies; Had PT evaluation on 11/02/2021. Will follow up with PT on 01/18/2021 for reassessment    Pain issues, if any:  Patient denies. Reports lingering tinging sensation to her right arm since the surgery, but  denies any range of motion issues or other concerns  SAFETY ISSUES: Prior radiation? No Pacemaker/ICD? No Possible current pregnancy? No--IUD in place Is the patient on methotrexate? No  Current Complaints / other details:  Nothing else of note       

## 2021-11-06 ENCOUNTER — Encounter: Payer: Self-pay | Admitting: Radiation Oncology

## 2021-11-06 ENCOUNTER — Ambulatory Visit
Admission: RE | Admit: 2021-11-06 | Discharge: 2021-11-06 | Disposition: A | Discharge status: Home / Self Care | Payer: 59 | Source: Ambulatory Visit | Attending: Radiation Oncology | Admitting: Radiation Oncology

## 2021-11-06 ENCOUNTER — Other Ambulatory Visit: Payer: Self-pay

## 2021-11-06 VITALS — BP 130/86 | HR 80 | Temp 97.2°F | Resp 18 | Ht 65.0 in | Wt 209.2 lb

## 2021-11-06 DIAGNOSIS — Z170 Estrogen receptor positive status [ER+]: Secondary | ICD-10-CM | POA: Insufficient documentation

## 2021-11-06 DIAGNOSIS — C50211 Malignant neoplasm of upper-inner quadrant of right female breast: Secondary | ICD-10-CM | POA: Diagnosis not present

## 2021-11-06 DIAGNOSIS — Z79899 Other long term (current) drug therapy: Secondary | ICD-10-CM | POA: Diagnosis not present

## 2021-11-06 MED ORDER — ALRA NON-METALLIC DEODORANT (RAD-ONC)
1.0000 "application " | Freq: Once | TOPICAL | Status: AC
  Administered 2021-11-06: 1 via TOPICAL

## 2021-11-06 NOTE — Addendum Note (Signed)
Encounter addended by: Zola Button, RN on: 11/06/2021 11:23 AM  Actions taken: MAR administration accepted

## 2021-11-06 NOTE — Addendum Note (Signed)
Encounter addended by: Zola Button, RN on: 11/06/2021 11:25 AM  Actions taken: Order list changed, Diagnosis association updated, MAR administration accepted, Clinical Note Signed

## 2021-11-09 ENCOUNTER — Other Ambulatory Visit: Payer: Self-pay

## 2021-11-09 ENCOUNTER — Ambulatory Visit
Admission: RE | Admit: 2021-11-09 | Discharge: 2021-11-09 | Disposition: A | Discharge status: Home / Self Care | Payer: 59 | Source: Ambulatory Visit | Attending: Radiation Oncology | Admitting: Radiation Oncology

## 2021-11-09 DIAGNOSIS — Z510 Encounter for antineoplastic radiation therapy: Secondary | ICD-10-CM | POA: Diagnosis not present

## 2021-11-09 DIAGNOSIS — C50211 Malignant neoplasm of upper-inner quadrant of right female breast: Secondary | ICD-10-CM | POA: Insufficient documentation

## 2021-11-09 DIAGNOSIS — Z170 Estrogen receptor positive status [ER+]: Secondary | ICD-10-CM | POA: Insufficient documentation

## 2021-11-11 DIAGNOSIS — C50211 Malignant neoplasm of upper-inner quadrant of right female breast: Secondary | ICD-10-CM | POA: Diagnosis not present

## 2021-11-11 DIAGNOSIS — Z170 Estrogen receptor positive status [ER+]: Secondary | ICD-10-CM | POA: Diagnosis not present

## 2021-11-11 DIAGNOSIS — Z510 Encounter for antineoplastic radiation therapy: Secondary | ICD-10-CM | POA: Diagnosis not present

## 2021-11-12 ENCOUNTER — Encounter: Payer: Self-pay | Admitting: *Deleted

## 2021-11-13 ENCOUNTER — Telehealth: Payer: Self-pay | Admitting: Hematology and Oncology

## 2021-11-13 ENCOUNTER — Other Ambulatory Visit: Payer: Self-pay

## 2021-11-13 ENCOUNTER — Inpatient Hospital Stay: Payer: 59 | Attending: Hematology and Oncology

## 2021-11-13 VITALS — BP 123/72 | HR 99 | Temp 98.1°F | Resp 17

## 2021-11-13 DIAGNOSIS — Z170 Estrogen receptor positive status [ER+]: Secondary | ICD-10-CM | POA: Diagnosis not present

## 2021-11-13 DIAGNOSIS — C50211 Malignant neoplasm of upper-inner quadrant of right female breast: Secondary | ICD-10-CM | POA: Insufficient documentation

## 2021-11-13 MED ORDER — GOSERELIN ACETATE 3.6 MG ~~LOC~~ IMPL
3.6000 mg | DRUG_IMPLANT | Freq: Once | SUBCUTANEOUS | Status: AC
  Administered 2021-11-13: 3.6 mg via SUBCUTANEOUS
  Filled 2021-11-13: qty 3.6

## 2021-11-13 NOTE — Telephone Encounter (Signed)
Scheduled per sch msg. Called and spoke with patient. Confirmed appt  

## 2021-11-16 ENCOUNTER — Ambulatory Visit
Admission: RE | Admit: 2021-11-16 | Discharge: 2021-11-16 | Disposition: A | Discharge status: Home / Self Care | Payer: 59 | Source: Ambulatory Visit | Attending: Radiation Oncology | Admitting: Radiation Oncology

## 2021-11-16 ENCOUNTER — Other Ambulatory Visit: Payer: Self-pay

## 2021-11-16 DIAGNOSIS — C50211 Malignant neoplasm of upper-inner quadrant of right female breast: Secondary | ICD-10-CM | POA: Diagnosis not present

## 2021-11-16 DIAGNOSIS — Z510 Encounter for antineoplastic radiation therapy: Secondary | ICD-10-CM | POA: Diagnosis not present

## 2021-11-16 DIAGNOSIS — Z170 Estrogen receptor positive status [ER+]: Secondary | ICD-10-CM | POA: Diagnosis not present

## 2021-11-16 MED ORDER — ALRA NON-METALLIC DEODORANT (RAD-ONC)
1.0000 "application " | Freq: Once | TOPICAL | Status: AC
  Administered 2021-11-16: 1 via TOPICAL

## 2021-11-16 MED ORDER — RADIAPLEXRX EX GEL: Freq: Once | CUTANEOUS | Status: AC

## 2021-11-16 NOTE — Progress Notes (Signed)

## 2021-11-17 ENCOUNTER — Ambulatory Visit
Admission: RE | Admit: 2021-11-17 | Discharge: 2021-11-17 | Disposition: A | Discharge status: Home / Self Care | Payer: 59 | Source: Ambulatory Visit | Attending: Radiation Oncology | Admitting: Radiation Oncology

## 2021-11-17 DIAGNOSIS — C50211 Malignant neoplasm of upper-inner quadrant of right female breast: Secondary | ICD-10-CM | POA: Diagnosis not present

## 2021-11-17 DIAGNOSIS — Z170 Estrogen receptor positive status [ER+]: Secondary | ICD-10-CM | POA: Diagnosis not present

## 2021-11-17 DIAGNOSIS — Z510 Encounter for antineoplastic radiation therapy: Secondary | ICD-10-CM | POA: Diagnosis not present

## 2021-11-18 ENCOUNTER — Ambulatory Visit
Admission: RE | Admit: 2021-11-18 | Discharge: 2021-11-18 | Disposition: A | Discharge status: Home / Self Care | Payer: 59 | Source: Ambulatory Visit | Attending: Radiation Oncology | Admitting: Radiation Oncology

## 2021-11-18 ENCOUNTER — Other Ambulatory Visit: Payer: Self-pay

## 2021-11-18 DIAGNOSIS — Z510 Encounter for antineoplastic radiation therapy: Secondary | ICD-10-CM | POA: Diagnosis not present

## 2021-11-18 DIAGNOSIS — C50211 Malignant neoplasm of upper-inner quadrant of right female breast: Secondary | ICD-10-CM | POA: Diagnosis not present

## 2021-11-18 DIAGNOSIS — Z170 Estrogen receptor positive status [ER+]: Secondary | ICD-10-CM | POA: Diagnosis not present

## 2021-11-19 ENCOUNTER — Ambulatory Visit
Admission: RE | Admit: 2021-11-19 | Discharge: 2021-11-19 | Disposition: A | Discharge status: Home / Self Care | Payer: 59 | Source: Ambulatory Visit | Attending: Radiation Oncology | Admitting: Radiation Oncology

## 2021-11-19 DIAGNOSIS — Z510 Encounter for antineoplastic radiation therapy: Secondary | ICD-10-CM | POA: Diagnosis not present

## 2021-11-19 DIAGNOSIS — Z170 Estrogen receptor positive status [ER+]: Secondary | ICD-10-CM | POA: Diagnosis not present

## 2021-11-19 DIAGNOSIS — C50211 Malignant neoplasm of upper-inner quadrant of right female breast: Secondary | ICD-10-CM | POA: Diagnosis not present

## 2021-11-20 ENCOUNTER — Ambulatory Visit
Admission: RE | Admit: 2021-11-20 | Discharge: 2021-11-20 | Disposition: A | Discharge status: Home / Self Care | Payer: 59 | Source: Ambulatory Visit | Attending: Radiation Oncology | Admitting: Radiation Oncology

## 2021-11-20 ENCOUNTER — Other Ambulatory Visit: Payer: Self-pay

## 2021-11-20 DIAGNOSIS — Z170 Estrogen receptor positive status [ER+]: Secondary | ICD-10-CM | POA: Diagnosis not present

## 2021-11-20 DIAGNOSIS — C50211 Malignant neoplasm of upper-inner quadrant of right female breast: Secondary | ICD-10-CM | POA: Diagnosis not present

## 2021-11-20 DIAGNOSIS — Z510 Encounter for antineoplastic radiation therapy: Secondary | ICD-10-CM | POA: Diagnosis not present

## 2021-11-23 ENCOUNTER — Encounter: Payer: Self-pay | Admitting: *Deleted

## 2021-11-23 ENCOUNTER — Ambulatory Visit
Admission: RE | Admit: 2021-11-23 | Discharge: 2021-11-23 | Disposition: A | Discharge status: Home / Self Care | Payer: 59 | Source: Ambulatory Visit | Attending: Radiation Oncology | Admitting: Radiation Oncology

## 2021-11-23 DIAGNOSIS — C50211 Malignant neoplasm of upper-inner quadrant of right female breast: Secondary | ICD-10-CM | POA: Diagnosis not present

## 2021-11-23 DIAGNOSIS — Z170 Estrogen receptor positive status [ER+]: Secondary | ICD-10-CM | POA: Diagnosis not present

## 2021-11-23 DIAGNOSIS — Z510 Encounter for antineoplastic radiation therapy: Secondary | ICD-10-CM | POA: Diagnosis not present

## 2021-11-24 ENCOUNTER — Ambulatory Visit
Admission: RE | Admit: 2021-11-24 | Discharge: 2021-11-24 | Disposition: A | Discharge status: Home / Self Care | Payer: 59 | Source: Ambulatory Visit | Attending: Radiation Oncology | Admitting: Radiation Oncology

## 2021-11-24 ENCOUNTER — Other Ambulatory Visit: Payer: Self-pay

## 2021-11-24 DIAGNOSIS — Z510 Encounter for antineoplastic radiation therapy: Secondary | ICD-10-CM | POA: Diagnosis not present

## 2021-11-24 DIAGNOSIS — C50211 Malignant neoplasm of upper-inner quadrant of right female breast: Secondary | ICD-10-CM | POA: Diagnosis not present

## 2021-11-24 DIAGNOSIS — Z170 Estrogen receptor positive status [ER+]: Secondary | ICD-10-CM | POA: Diagnosis not present

## 2021-11-25 ENCOUNTER — Ambulatory Visit
Admission: RE | Admit: 2021-11-25 | Discharge: 2021-11-25 | Disposition: A | Discharge status: Home / Self Care | Payer: 59 | Source: Ambulatory Visit | Attending: Radiation Oncology | Admitting: Radiation Oncology

## 2021-11-25 DIAGNOSIS — Z510 Encounter for antineoplastic radiation therapy: Secondary | ICD-10-CM | POA: Diagnosis not present

## 2021-11-25 DIAGNOSIS — Z170 Estrogen receptor positive status [ER+]: Secondary | ICD-10-CM | POA: Diagnosis not present

## 2021-11-25 DIAGNOSIS — C50211 Malignant neoplasm of upper-inner quadrant of right female breast: Secondary | ICD-10-CM | POA: Diagnosis not present

## 2021-11-26 ENCOUNTER — Ambulatory Visit
Admission: RE | Admit: 2021-11-26 | Discharge: 2021-11-26 | Disposition: A | Discharge status: Home / Self Care | Payer: 59 | Source: Ambulatory Visit | Attending: Radiation Oncology | Admitting: Radiation Oncology

## 2021-11-26 ENCOUNTER — Other Ambulatory Visit: Payer: Self-pay

## 2021-11-26 DIAGNOSIS — Z170 Estrogen receptor positive status [ER+]: Secondary | ICD-10-CM | POA: Diagnosis not present

## 2021-11-26 DIAGNOSIS — Z510 Encounter for antineoplastic radiation therapy: Secondary | ICD-10-CM | POA: Diagnosis not present

## 2021-11-26 DIAGNOSIS — C50211 Malignant neoplasm of upper-inner quadrant of right female breast: Secondary | ICD-10-CM | POA: Diagnosis not present

## 2021-11-27 ENCOUNTER — Ambulatory Visit
Admission: RE | Admit: 2021-11-27 | Discharge: 2021-11-27 | Disposition: A | Discharge status: Home / Self Care | Payer: 59 | Source: Ambulatory Visit | Attending: Radiation Oncology | Admitting: Radiation Oncology

## 2021-11-27 DIAGNOSIS — Z170 Estrogen receptor positive status [ER+]: Secondary | ICD-10-CM | POA: Diagnosis not present

## 2021-11-27 DIAGNOSIS — Z510 Encounter for antineoplastic radiation therapy: Secondary | ICD-10-CM | POA: Diagnosis not present

## 2021-11-27 DIAGNOSIS — C50211 Malignant neoplasm of upper-inner quadrant of right female breast: Secondary | ICD-10-CM | POA: Diagnosis not present

## 2021-12-01 ENCOUNTER — Ambulatory Visit
Admission: RE | Admit: 2021-12-01 | Discharge: 2021-12-01 | Disposition: A | Discharge status: Home / Self Care | Payer: 59 | Source: Ambulatory Visit | Attending: Radiation Oncology | Admitting: Radiation Oncology

## 2021-12-01 ENCOUNTER — Other Ambulatory Visit: Payer: Self-pay

## 2021-12-01 DIAGNOSIS — C50211 Malignant neoplasm of upper-inner quadrant of right female breast: Secondary | ICD-10-CM | POA: Diagnosis not present

## 2021-12-01 DIAGNOSIS — Z510 Encounter for antineoplastic radiation therapy: Secondary | ICD-10-CM | POA: Diagnosis not present

## 2021-12-01 DIAGNOSIS — Z170 Estrogen receptor positive status [ER+]: Secondary | ICD-10-CM | POA: Diagnosis not present

## 2021-12-02 ENCOUNTER — Ambulatory Visit: Payer: 59 | Admitting: Radiation Oncology

## 2021-12-02 ENCOUNTER — Ambulatory Visit
Admission: RE | Admit: 2021-12-02 | Discharge: 2021-12-02 | Disposition: A | Discharge status: Home / Self Care | Payer: 59 | Source: Ambulatory Visit | Attending: Radiation Oncology | Admitting: Radiation Oncology

## 2021-12-02 DIAGNOSIS — Z170 Estrogen receptor positive status [ER+]: Secondary | ICD-10-CM | POA: Diagnosis not present

## 2021-12-02 DIAGNOSIS — Z510 Encounter for antineoplastic radiation therapy: Secondary | ICD-10-CM | POA: Diagnosis not present

## 2021-12-02 DIAGNOSIS — C50211 Malignant neoplasm of upper-inner quadrant of right female breast: Secondary | ICD-10-CM | POA: Diagnosis not present

## 2021-12-03 ENCOUNTER — Other Ambulatory Visit: Payer: Self-pay

## 2021-12-03 ENCOUNTER — Ambulatory Visit
Admission: RE | Admit: 2021-12-03 | Discharge: 2021-12-03 | Disposition: A | Discharge status: Home / Self Care | Payer: 59 | Source: Ambulatory Visit | Attending: Radiation Oncology | Admitting: Radiation Oncology

## 2021-12-03 DIAGNOSIS — Z170 Estrogen receptor positive status [ER+]: Secondary | ICD-10-CM | POA: Diagnosis not present

## 2021-12-03 DIAGNOSIS — Z510 Encounter for antineoplastic radiation therapy: Secondary | ICD-10-CM | POA: Diagnosis not present

## 2021-12-03 DIAGNOSIS — C50211 Malignant neoplasm of upper-inner quadrant of right female breast: Secondary | ICD-10-CM | POA: Diagnosis not present

## 2021-12-04 ENCOUNTER — Ambulatory Visit
Admission: RE | Admit: 2021-12-04 | Discharge: 2021-12-04 | Disposition: A | Discharge status: Home / Self Care | Payer: 59 | Source: Ambulatory Visit | Attending: Radiation Oncology | Admitting: Radiation Oncology

## 2021-12-04 DIAGNOSIS — Z170 Estrogen receptor positive status [ER+]: Secondary | ICD-10-CM | POA: Diagnosis not present

## 2021-12-04 DIAGNOSIS — Z510 Encounter for antineoplastic radiation therapy: Secondary | ICD-10-CM | POA: Diagnosis not present

## 2021-12-04 DIAGNOSIS — C50211 Malignant neoplasm of upper-inner quadrant of right female breast: Secondary | ICD-10-CM | POA: Diagnosis not present

## 2021-12-08 ENCOUNTER — Ambulatory Visit
Admission: RE | Admit: 2021-12-08 | Discharge: 2021-12-08 | Disposition: A | Discharge status: Home / Self Care | Payer: 59 | Source: Ambulatory Visit | Attending: Radiation Oncology | Admitting: Radiation Oncology

## 2021-12-08 ENCOUNTER — Other Ambulatory Visit: Payer: Self-pay

## 2021-12-08 DIAGNOSIS — Z170 Estrogen receptor positive status [ER+]: Secondary | ICD-10-CM | POA: Insufficient documentation

## 2021-12-08 DIAGNOSIS — C50211 Malignant neoplasm of upper-inner quadrant of right female breast: Secondary | ICD-10-CM | POA: Insufficient documentation

## 2021-12-08 MED ORDER — SILVER SULFADIAZINE 1 % EX CREA: TOPICAL_CREAM | Freq: Every day | CUTANEOUS | Status: DC

## 2021-12-09 ENCOUNTER — Ambulatory Visit
Admission: RE | Admit: 2021-12-09 | Discharge: 2021-12-09 | Disposition: A | Discharge status: Home / Self Care | Payer: 59 | Source: Ambulatory Visit | Attending: Radiation Oncology | Admitting: Radiation Oncology

## 2021-12-09 DIAGNOSIS — C50211 Malignant neoplasm of upper-inner quadrant of right female breast: Secondary | ICD-10-CM | POA: Diagnosis not present

## 2021-12-09 DIAGNOSIS — Z170 Estrogen receptor positive status [ER+]: Secondary | ICD-10-CM | POA: Diagnosis not present

## 2021-12-10 ENCOUNTER — Other Ambulatory Visit: Payer: Self-pay

## 2021-12-10 ENCOUNTER — Ambulatory Visit
Admission: RE | Admit: 2021-12-10 | Discharge: 2021-12-10 | Disposition: A | Discharge status: Home / Self Care | Payer: 59 | Source: Ambulatory Visit | Attending: Radiation Oncology | Admitting: Radiation Oncology

## 2021-12-10 DIAGNOSIS — Z170 Estrogen receptor positive status [ER+]: Secondary | ICD-10-CM | POA: Diagnosis not present

## 2021-12-10 DIAGNOSIS — C50211 Malignant neoplasm of upper-inner quadrant of right female breast: Secondary | ICD-10-CM | POA: Diagnosis not present

## 2021-12-11 ENCOUNTER — Ambulatory Visit
Admission: RE | Admit: 2021-12-11 | Discharge: 2021-12-11 | Disposition: A | Discharge status: Home / Self Care | Payer: 59 | Source: Ambulatory Visit | Attending: Radiation Oncology | Admitting: Radiation Oncology

## 2021-12-11 DIAGNOSIS — Z170 Estrogen receptor positive status [ER+]: Secondary | ICD-10-CM | POA: Diagnosis not present

## 2021-12-11 DIAGNOSIS — C50211 Malignant neoplasm of upper-inner quadrant of right female breast: Secondary | ICD-10-CM | POA: Diagnosis not present

## 2021-12-14 ENCOUNTER — Encounter: Payer: Self-pay | Admitting: *Deleted

## 2021-12-14 ENCOUNTER — Inpatient Hospital Stay: Payer: 59 | Attending: Hematology and Oncology

## 2021-12-14 ENCOUNTER — Ambulatory Visit
Admission: RE | Admit: 2021-12-14 | Discharge: 2021-12-14 | Disposition: A | Discharge status: Home / Self Care | Payer: 59 | Source: Ambulatory Visit | Attending: Radiation Oncology | Admitting: Radiation Oncology

## 2021-12-14 ENCOUNTER — Other Ambulatory Visit: Payer: Self-pay

## 2021-12-14 VITALS — BP 119/82 | HR 85 | Temp 97.9°F | Resp 16

## 2021-12-14 DIAGNOSIS — C50211 Malignant neoplasm of upper-inner quadrant of right female breast: Secondary | ICD-10-CM

## 2021-12-14 DIAGNOSIS — Z170 Estrogen receptor positive status [ER+]: Secondary | ICD-10-CM | POA: Insufficient documentation

## 2021-12-14 MED ORDER — GOSERELIN ACETATE 3.6 MG ~~LOC~~ IMPL
3.6000 mg | DRUG_IMPLANT | Freq: Once | SUBCUTANEOUS | Status: AC
  Administered 2021-12-14: 3.6 mg via SUBCUTANEOUS
  Filled 2021-12-14: qty 3.6

## 2021-12-14 NOTE — Patient Instructions (Signed)
Goserelin injection °What is this medication? °GOSERELIN (GOE se rel in) is similar to a hormone found in the body. It lowers the amount of sex hormones that the body makes. Men will have lower testosterone levels and women will have lower estrogen levels while taking this medicine. In men, this medicine is used to treat prostate cancer; the injection is either given once per month or once every 12 weeks. A once per month injection (only) is used to treat women with endometriosis, dysfunctional uterine bleeding, or advanced breast cancer. °This medicine may be used for other purposes; ask your health care provider or pharmacist if you have questions. °COMMON BRAND NAME(S): Zoladex, Zoladex 3-Month °What should I tell my care team before I take this medication? °They need to know if you have any of these conditions: °bone problems °diabetes °heart disease °history of irregular heartbeat °an unusual or allergic reaction to goserelin, other medicines, foods, dyes, or preservatives °pregnant or trying to get pregnant °breast-feeding °How should I use this medication? °This medicine is for injection under the skin. It is given by a health care professional in a hospital or clinic setting. °Talk to your pediatrician regarding the use of this medicine in children. Special care may be needed. °Overdosage: If you think you have taken too much of this medicine contact a poison control center or emergency room at once. °NOTE: This medicine is only for you. Do not share this medicine with others. °What if I miss a dose? °It is important not to miss your dose. Call your doctor or health care professional if you are unable to keep an appointment. °What may interact with this medication? °Do not take this medicine with any of the following medications: °cisapride °dronedarone °pimozide °thioridazine °This medicine may also interact with the following medications: °other medicines that prolong the QT interval (an abnormal heart  rhythm) °This list may not describe all possible interactions. Give your health care provider a list of all the medicines, herbs, non-prescription drugs, or dietary supplements you use. Also tell them if you smoke, drink alcohol, or use illegal drugs. Some items may interact with your medicine. °What should I watch for while using this medication? °Visit your doctor or health care provider for regular checks on your progress. Your symptoms may appear to get worse during the first weeks of this therapy. Tell your doctor or healthcare provider if your symptoms do not start to get better or if they get worse after this time. °Your bones may get weaker if you take this medicine for a long time. If you smoke or frequently drink alcohol you may increase your risk of bone loss. A family history of osteoporosis, chronic use of drugs for seizures (convulsions), or corticosteroids can also increase your risk of bone loss. Talk to your doctor about how to keep your bones strong. °This medicine should stop regular monthly menstruation in women. Tell your doctor if you continue to menstruate. °Women should not become pregnant while taking this medicine or for 12 weeks after stopping this medicine. Women should inform their doctor if they wish to become pregnant or think they might be pregnant. There is a potential for serious side effects to an unborn child. Talk to your health care professional or pharmacist for more information. Do not breast-feed an infant while taking this medicine. °Men should inform their doctors if they wish to father a child. This medicine may lower sperm counts. Talk to your health care professional or pharmacist for more information. °This   medicine may increase blood sugar. Ask your healthcare provider if changes in diet or medicines are needed if you have diabetes. °What side effects may I notice from receiving this medication? °Side effects that you should report to your doctor or health care  professional as soon as possible: °allergic reactions like skin rash, itching or hives, swelling of the face, lips, or tongue °bone pain °breathing problems °changes in vision °chest pain °feeling faint or lightheaded, falls °fever, chills °pain, swelling, warmth in the leg °pain, tingling, numbness in the hands or feet °signs and symptoms of high blood sugar such as being more thirsty or hungry or having to urinate more than normal. You may also feel very tired or have blurry vision °signs and symptoms of low blood pressure like dizziness; feeling faint or lightheaded, falls; unusually weak or tired °stomach pain °swelling of the ankles, feet, hands °trouble passing urine or change in the amount of urine °unusually high or low blood pressure °unusually weak or tired °Side effects that usually do not require medical attention (report to your doctor or health care professional if they continue or are bothersome): °change in sex drive or performance °changes in breast size in both males and females °changes in emotions or moods °headache °hot flashes °irritation at site where injected °loss of appetite °skin problems like acne, dry skin °vaginal dryness °This list may not describe all possible side effects. Call your doctor for medical advice about side effects. You may report side effects to FDA at 1-800-FDA-1088. °Where should I keep my medication? °This drug is given in a hospital or clinic and will not be stored at home. °NOTE: This sheet is a summary. It may not cover all possible information. If you have questions about this medicine, talk to your doctor, pharmacist, or health care provider. °© 2022 [employer]/Gold Standard (2019-03-23 00:00:00) ° °

## 2021-12-15 ENCOUNTER — Encounter: Payer: Self-pay | Admitting: Radiation Oncology

## 2021-12-15 ENCOUNTER — Encounter: Payer: Self-pay | Admitting: Hematology and Oncology

## 2021-12-15 ENCOUNTER — Ambulatory Visit
Admission: RE | Admit: 2021-12-15 | Discharge: 2021-12-15 | Disposition: A | Discharge status: Home / Self Care | Payer: 59 | Source: Ambulatory Visit | Attending: Radiation Oncology | Admitting: Radiation Oncology

## 2021-12-15 DIAGNOSIS — Z170 Estrogen receptor positive status [ER+]: Secondary | ICD-10-CM | POA: Diagnosis not present

## 2021-12-15 DIAGNOSIS — C50211 Malignant neoplasm of upper-inner quadrant of right female breast: Secondary | ICD-10-CM

## 2021-12-15 MED ORDER — RADIAPLEXRX EX GEL: Freq: Once | CUTANEOUS | Status: AC

## 2021-12-15 NOTE — Progress Notes (Signed)
Called pt regarding copay assistance for Zoladex.  I left a msg requesting she return my call if she's interested in applying for the grant.

## 2021-12-16 NOTE — Assessment & Plan Note (Signed)
05/27/2021:Right breast biopsy 05/27/2021: 4 cm of grade 3 IDC with DCIS ER 35%, PR 80%, Ki-67 60%, HER2 1+ negative,T2 N0 stageIIA Oncotype score: 30: High risk Neoadjuvant chemo with Taxotere and Cytoxan x4 cycles completed 08/27/2021   10/16/2021:Right lumpectomy: Residual grade 2 IDC 0.4 cm, residual DCIS high-grade, margins negative, 0/4 lymph nodes negative, ER 40% weak staining, PR 20% moderate staining, Ki-67 less than 1%, HER2 equal vocal 2+ by IHC, FISH pending, RCB class I  Treatment plan: 1. Adjuvant radiation completed 12/15/21 2.Followed by adjuvant antiestrogen therapy with complete estrogen blockade Fertility preservation: Zoladex injections monthly started 06/19/2021 Patient may decide to stop the Zoladex injections if she wants to get pregnant. We will discuss this when she comes back at the end of radiation.

## 2021-12-17 ENCOUNTER — Other Ambulatory Visit (HOSPITAL_COMMUNITY): Payer: Self-pay | Admitting: General Surgery

## 2021-12-17 ENCOUNTER — Other Ambulatory Visit: Payer: Self-pay

## 2021-12-17 ENCOUNTER — Encounter: Payer: Self-pay | Admitting: Hematology and Oncology

## 2021-12-17 ENCOUNTER — Inpatient Hospital Stay: Payer: 59 | Admitting: Hematology and Oncology

## 2021-12-17 DIAGNOSIS — Z170 Estrogen receptor positive status [ER+]: Secondary | ICD-10-CM

## 2021-12-17 DIAGNOSIS — C50211 Malignant neoplasm of upper-inner quadrant of right female breast: Secondary | ICD-10-CM

## 2021-12-17 DIAGNOSIS — Z95828 Presence of other vascular implants and grafts: Secondary | ICD-10-CM

## 2021-12-17 NOTE — Progress Notes (Signed)
Patient Care Team: Hoyt Koch, MD as PCP - General (Internal Medicine) Frederik Pear, MD (Orthopedic Surgery) Rockwell Germany, RN as Oncology Nurse Navigator Mauro Kaufmann, RN as Oncology Nurse Navigator  DIAGNOSIS:  Encounter Diagnosis  Name Primary?   Malignant neoplasm of upper-inner quadrant of right breast in female, estrogen receptor positive ([place])     SUMMARY OF ONCOLOGIC HISTORY: Oncology History  Malignant neoplasm of upper-inner quadrant of right breast in female, estrogen receptor positive ([place])  05/27/2021 Initial Diagnosis   Right breast biopsy 05/27/2021: Grade 3 IDC with DCIS ER 35%, PR 80%, Ki-67 60%, HER2 1+ negative,T2 N0 stage IIA Oncotype score: 30: High risk   06/02/2021 Cancer Staging   Staging form: Breast, AJCC 8th Edition - Clinical: Stage IIA (cT2, cN0, cM0, G3, ER+, PR+, HER2-) - Signed by Nicholas Lose, MD on 06/02/2021 Stage prefix: Initial diagnosis Histologic grading system: 3 grade system    06/09/2021 Oncotype testing   Oncotype score 30   06/26/2021 - 08/27/2021 Chemotherapy    Patient is on Treatment Plan: BREAST TC Q21D x4 cycles       10/16/2021 Surgery   Right lumpectomy: Residual grade 2 IDC 0.4 cm, residual DCIS high-grade, margins negative, 0/4 lymph nodes negative, ER 40% weak staining, PR 20% moderate staining, Ki-67 less than 1%, HER2 equal vocal 2+ by IHC, FISH pending, RCB class I   11/17/2021 - 12/15/2021 Radiation Therapy   Adjuvant radiation therapy     CHIEF COMPLIANT: Follow-up after completion of radiation  INTERVAL HISTORY: Amy Pittman is a 36 year old above-mentioned history of right breast cancer treated with neoadjuvant chemotherapy followed by lumpectomy and she just completed radiation on 12/15/2021.  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  She is here accompanied by her boyfriend and they have several questions about pregnancy and fertility and antiestrogen therapy.  She  wishes to stop the Zoladex injections.   ALLERGIES:  has No Known Allergies.  MEDICATIONS:  Current Outpatient Medications  Medication Sig Dispense Refill   levonorgestrel (MIRENA, 52 MG,) 20 MCG/24HR IUD 1 Intra Uterine Device by Intrauterine route once. Every 7 years     lidocaine-prilocaine (EMLA) cream Apply to affected area once 30 g 3   No current facility-administered medications for this visit.    PHYSICAL EXAMINATION: ECOG PERFORMANCE STATUS: 1 - Symptomatic but completely ambulatory  Vitals:   12/17/21 1504  BP: 137/73  Pulse: 80  Resp: 18  Temp: 97.7 F (36.5 C)  SpO2: 98%   Filed Weights   12/17/21 1504  Weight: 206 lb 4.8 oz (93.6 kg)      LABORATORY DATA:  I have reviewed the data as listed CMP Latest Ref Rng & Units 08/27/2021 08/06/2021 07/16/2021  Glucose 70 - 99 mg/dL 113(H) 91 97  BUN 6 - 20 mg/dL 7 11 14   Creatinine 0.44 - 1.00 mg/dL 0.65 0.70 0.67  Sodium 135 - 145 mmol/L 143 143 141  Potassium 3.5 - 5.1 mmol/L 3.7 3.4(L) 3.5  Chloride 98 - 111 mmol/L 110 108 106  CO2 22 - 32 mmol/L 21(L) 23 24  Calcium 8.9 - 10.3 mg/dL 9.7 9.4 9.1  Total Protein 6.5 - 8.1 g/dL 7.3 7.0 7.2  Total Bilirubin 0.3 - 1.2 mg/dL 0.3 0.4 0.4  Alkaline Phos 38 - 126 U/L 64 54 50  AST 15 - 41 U/L 12(L) 15 14(L)  ALT 0 - 44 U/L 15 18 22     Lab Results  Component Value Date  WBC 9.5 08/27/2021   HGB 11.2 (L) 08/27/2021   HCT 33.5 (L) 08/27/2021   MCV 90.3 08/27/2021   PLT 332 08/27/2021   NEUTROABS 7.8 (H) 08/27/2021    ASSESSMENT & PLAN:  Malignant neoplasm of upper-inner quadrant of right breast in female, estrogen receptor positive ([place]) 05/27/2021:Right breast biopsy 05/27/2021: 4 cm of grade 3 IDC with DCIS ER 35%, PR 80%, Ki-67 60%, HER2 1+ negative,T2 N0 stage IIA Oncotype score: 30: High risk Neoadjuvant chemo with Taxotere and Cytoxan x4 cycles completed 08/27/2021    10/16/2021:Right lumpectomy: Residual grade 2 IDC 0.4 cm, residual DCIS high-grade,  margins negative, 0/4 lymph nodes negative, ER 40% weak staining, PR 20% moderate staining, Ki-67 less than 1%, HER2 equal vocal 2+ by IHC, FISH pending, RCB class I   Treatment plan: 1. Adjuvant radiation completed 12/15/21 2. we will stop Zoladex injections because the patient is contemplating on having children.  She is unsure whether she wants to have family now or later.  If she decides to wait 2 years to have a child then we can initiate tamoxifen therapy for 2 years and then she can plan for that.  Once she decides on the timing of her pregnancy, we can initiate either tamoxifen or wait for her to have a child and then start antiestrogen therapy.  Return to clinic in 3 months for survivorship care plan visit. No orders of the defined types were placed in this encounter.  The patient has a good understanding of the overall plan. she agrees with it. she will call with any problems that may develop before the next visit here. Total time spent: 30 mins including face to face time and time spent for planning, charting and co-ordination of care   Harriette Ohara, MD 12/17/21

## 2021-12-18 ENCOUNTER — Telehealth: Payer: Self-pay | Admitting: Hematology and Oncology

## 2021-12-18 ENCOUNTER — Other Ambulatory Visit: Payer: Self-pay | Admitting: *Deleted

## 2021-12-18 MED ORDER — TAMOXIFEN CITRATE 10 MG PO TABS: 10.0000 mg | ORAL_TABLET | Freq: Every day | ORAL | 1 refills | Status: DC

## 2021-12-18 NOTE — Progress Notes (Signed)
Received message from pt stating she would like to wait 2 years prior to attempting to become pregnant.  Per MD pt to be prescribed Tamoxifen 10 mg p.o daily and f/u with survivorship in 1 month to assess tolerability.  Prescription sent to pharmacy on file and pt educated.

## 2021-12-18 NOTE — Telephone Encounter (Signed)
Scheduled appointment per 01/12 los. Patient aware.

## 2021-12-21 ENCOUNTER — Encounter: Payer: 59 | Admitting: Adult Health

## 2022-01-05 ENCOUNTER — Encounter: Payer: Self-pay | Admitting: Hematology and Oncology

## 2022-01-05 NOTE — Progress Notes (Signed)
° °                                                                                                                                                          °  Patient Name: Amy Pittman MRN: [id number] DOB: [birth year] Referring Physician: Nicholas Lose (Profile Not Attached) Date of Service: 12/15/2021 [facility] [place]                                                        End Of Treatment Note  Diagnoses: C50.211-Malignant neoplasm of upper-inner quadrant of right female breast  Cancer Staging:  Cancer Staging  Malignant neoplasm of upper-inner quadrant of right breast in female, estrogen receptor positive ([place]) Staging form: Breast, AJCC 8th Edition - Clinical: Stage IIA (cT2, cN0, cM0, G3, ER+, PR+, HER2-) - Signed by Nicholas Lose, MD on 06/02/2021 Stage prefix: Initial diagnosis Histologic grading system: 3 grade system  ypT1a ypN0  Intent: Curative  Radiation Treatment Dates: 11/16/2021 through 12/15/2021 Site Technique Total Dose (Gy) Dose per Fx (Gy) Completed Fx Beam Energies  Breast, Right: Breast_R 3D 40.05/40.05 2.67 15/15 10X, 6XFFF  Breast, Right: Breast_R_Bst 3D 10/10 2 5/5 6X   Narrative: The patient tolerated radiation therapy relatively well.   Plan: The patient will follow-up with radiation oncology in 66mo. -----------------------------------  Eppie Gibson, MD

## 2022-01-07 ENCOUNTER — Other Ambulatory Visit: Payer: Self-pay | Admitting: Student

## 2022-01-08 ENCOUNTER — Other Ambulatory Visit: Payer: Self-pay

## 2022-01-08 ENCOUNTER — Ambulatory Visit (HOSPITAL_COMMUNITY)
Admission: RE | Admit: 2022-01-08 | Discharge: 2022-01-08 | Disposition: A | Discharge status: Home / Self Care | Payer: 59 | Source: Ambulatory Visit | Attending: General Surgery | Admitting: General Surgery

## 2022-01-08 DIAGNOSIS — Z452 Encounter for adjustment and management of vascular access device: Secondary | ICD-10-CM | POA: Diagnosis not present

## 2022-01-08 DIAGNOSIS — C50911 Malignant neoplasm of unspecified site of right female breast: Secondary | ICD-10-CM | POA: Insufficient documentation

## 2022-01-08 DIAGNOSIS — Z9221 Personal history of antineoplastic chemotherapy: Secondary | ICD-10-CM | POA: Insufficient documentation

## 2022-01-08 DIAGNOSIS — Z95828 Presence of other vascular implants and grafts: Secondary | ICD-10-CM

## 2022-01-08 HISTORY — PX: IR REMOVAL TUN ACCESS W/ PORT W/O FL MOD SED: IMG2290

## 2022-01-08 MED ORDER — LIDOCAINE-EPINEPHRINE (PF) 1 %-1:200000 IJ SOLN
INTRAMUSCULAR | Status: DC | PRN
  Administered 2022-01-08: 10 mL

## 2022-01-08 MED ORDER — LIDOCAINE HCL 1 % IJ SOLN
INTRAMUSCULAR | Status: AC
  Filled 2022-01-08: qty 20

## 2022-01-08 MED ORDER — LIDOCAINE-EPINEPHRINE 1 %-1:100000 IJ SOLN
INTRAMUSCULAR | Status: AC
  Filled 2022-01-08: qty 1
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
## 2022-01-08 NOTE — Procedures (Signed)
Pre Procedural Dx: Poor venous access Post Procedural Dx: Same  Successful removal of anterior chest wall port-a-cath.  EBL: Trace No immediate post procedural complications.   Jay Kelsie Kramp, MD Pager #: [phone]  

## 2022-01-14 ENCOUNTER — Ambulatory Visit: Payer: 59

## 2022-01-15 ENCOUNTER — Ambulatory Visit: Payer: 59 | Admitting: Radiation Oncology

## 2022-01-15 ENCOUNTER — Telehealth: Payer: Self-pay

## 2022-01-15 NOTE — Telephone Encounter (Signed)
I called the patient today about her upcoming follow-up appointment in radiation oncology.   Given the state of the COVID-19 pandemic, concerning case numbers in our community, and guidance from [facility], I offered a phone assessment with the patient to determine if coming to the clinic was necessary. She accepted.  The patient denies any symptomatic concerns (other than hot flashes from her tamoxifen).  She denies any swelling or tenderness to her right breast. Denies any range of motion concerns to her right shoulder/arm, and is scheduled for another SOZO session with PT this coming Monday, Specifically, she reports good healing of her skin in the radiation fields.  Skin is intact and almost back to her baseline. She still has some radiaplex lotion which I encouraged her to finish. Once the radiaplex is gone, I recommended that she continue skin care by applying oil or lotion with vitamin E to the skin in the radiation fields, BID, for 2 more months.    Continue follow-up with medical oncology - follow-up is scheduled on 01/29/2022 with Annabelle Harman in the [facility].  I explained that yearly mammograms are important for patients with intact breast tissue, and physical exams are important after mastectomy for patients that cannot undergo mammography. She verbalized understanding and agreement  I encouraged her to call if she had further questions or concerns about her healing. Otherwise, she will follow-up PRN in radiation oncology. Patient is pleased with this plan, and we will cancel her upcoming follow-up to reduce the risk of COVID-19 transmission.

## 2022-01-18 ENCOUNTER — Other Ambulatory Visit: Payer: Self-pay

## 2022-01-18 ENCOUNTER — Ambulatory Visit: Payer: 59 | Attending: Hematology and Oncology

## 2022-01-18 VITALS — Wt 203.4 lb

## 2022-01-18 DIAGNOSIS — Z483 Aftercare following surgery for neoplasm: Secondary | ICD-10-CM | POA: Insufficient documentation

## 2022-01-18 NOTE — Therapy (Signed)
[facility] @ [address] [place], [place], [address] Phone: [phone]   Fax:  [phone]  Physical Therapy Treatment  Patient Details  Name: Amy Pittman MRN: [id number] Date of Birth: 1986/04/23 Referring Provider (PT): Dr. Autumn Messing   Encounter Date: 01/18/2022   PT End of Session - 01/18/22 1626     Visit Number 2   # unchanged due to screen only   PT Start Time 1623    PT Stop Time 1628    PT Time Calculation (min) 5 min    Activity Tolerance Patient tolerated treatment well    Behavior During Therapy [facility] for tasks assessed/performed             Past Medical History:  Diagnosis Date   Cancer [facility])    Generalized headaches    Generalized seizures [facility]) neurologist--  per pt followed by [place] neurologist   per pt dx 2010 and per pt none since ,did have work-up negative , felt she was going to sleep Abscent seizure  and triggers are stress and exhaustion   Menorrhagia    Wears glasses     Past Surgical History:  Procedure Laterality Date   ANKLE ARTHROSCOPY Right 07/09/2005     dr Mayer Camel  [facility]   w/ Debridement and removal loose body   BIOPSY  12/01/2020   Procedure: BIOPSY;  Surgeon: Jackquline Denmark, MD;  Location: [facility] ENDOSCOPY;  Service: Endoscopy;;   BREAST LUMPECTOMY WITH RADIOACTIVE SEED AND SENTINEL LYMPH NODE BIOPSY Right 10/16/2021   Procedure: RIGHT BREAST LUMPECTOMY WITH RADIOACTIVE SEED X2 AND SENTINEL LYMPH NODE BIOPSY;  Surgeon: Jovita Kussmaul, MD;  Location: [place];  Service: General;  Laterality: Right;   CHOLECYSTECTOMY N/A 12/02/2020   Procedure: LAPAROSCOPIC CHOLECYSTECTOMY WITH CHOLANGIOGRAM;  Surgeon: Erroll Luna, MD;  Location: [place];  Service: General;  Laterality: N/A;   [place] N/A 11/24/2017   Procedure: DILATATION & CURETTAGE/HYSTEROSCOPY;  Surgeon: Bobbye Charleston, MD;  Location: [place];  Service: Gynecology;  Laterality: N/A;   ERCP N/A 12/01/2020   Procedure: ENDOSCOPIC RETROGRADE CHOLANGIOPANCREATOGRAPHY (ERCP);  Surgeon: Jackquline Denmark, MD;  Location: [facility] ENDOSCOPY;  Service: Endoscopy;  Laterality: N/A;   IR IMAGING GUIDED PORT INSERTION  06/25/2021   IR REMOVAL TUN ACCESS W/ PORT W/O FL MOD SED  01/08/2022   REMOVAL OF STONES  12/01/2020   Procedure: REMOVAL OF STONES;  Surgeon: Jackquline Denmark, MD;  Location: [facility] ENDOSCOPY;  Service: Endoscopy;;   SPHINCTEROTOMY  12/01/2020   Procedure: Joan Mayans;  Surgeon: Jackquline Denmark, MD;  Location: [place];  Service: Endoscopy;;    Vitals:   01/18/22 1625  Weight: 203 lb 6 oz (92.3 kg)     Subjective Assessment - 01/18/22 1624     Subjective Pt returns for her 3 month L-Dex screen.    Pertinent History Patient reports she was diagnosed on 05/27/2021 with right grade III invasive ductal carcinoma breast cancer. She underwent neoadjuvant chemotherapy 06/26/2021 - 08/27/2021. Then she had a right lumpectomy and sentinel node biopsy (4 negative nodes) on 10/16/2021. It is ER/PR positive and HER2 negative with a Ki67 of 60%. She has a hx of a seizure disorder but no seizures since 2010.                    L-DEX FLOWSHEETS - 01/18/22 1600       L-DEX LYMPHEDEMA SCREENING   Measurement Type Unilateral  L-DEX MEASUREMENT EXTREMITY Upper Extremity    POSITION  Standing    DOMINANT SIDE Right    At Risk Side Right    BASELINE SCORE (UNILATERAL) -2.4    L-DEX SCORE (UNILATERAL) -1.2    VALUE CHANGE (UNILAT) 1.2                                     PT Long Term Goals - 11/02/21 1052       PT LONG TERM GOAL #1   Title Patient will demonstrate she has regainde full shoulder ROM and function post operatively compared to baselines.    Time 6    Period Months    Status Partially Met                   Plan - 01/18/22 1626     Clinical Impression Statement Pt returns  for her 3 month L-Dex screen. Her change from baseline of 1.2 is WNLs so no further treatment is required at this time except to cont every 3 month L-Dex screens which pt is agreeable to.    PT Next Visit Plan Cont every 3 month L-Dex screens for up to 2 years from her SLNB (~10/17/2023)    Consulted and Agree with Plan of Care Patient             Patient will benefit from skilled therapeutic intervention in order to improve the following deficits and impairments:     Visit Diagnosis: Aftercare following surgery for neoplasm     Problem List Patient Active Problem List   Diagnosis Date Noted   Genetic testing 08/06/2021   Malignant neoplasm of upper-inner quadrant of right breast in female, estrogen receptor positive ([place]) 06/02/2021   RUQ abdominal pain    Gallstones    Choledocholithiasis    Pancreatitis 11/29/2020   Cellulitis of left upper eyelid 10/11/2018   Low back pain 05/28/2015   Awareness alteration, transient 03/07/2015   Routine health maintenance 05/10/2012   Generalized seizures ([place])    Irregular menses     [name], PTA 01/18/2022, 4:31 PM  [facility] @ [address] [place], [place], [address] Phone: [phone]   Fax:  [phone]  Name: Amy Pittman MRN: [id number] Date of Birth: 1986-09-21

## 2022-01-29 ENCOUNTER — Encounter: Payer: 59 | Admitting: Adult Health

## 2022-02-02 ENCOUNTER — Encounter (HOSPITAL_COMMUNITY): Payer: Self-pay

## 2022-02-11 ENCOUNTER — Ambulatory Visit: Payer: 59

## 2022-02-18 ENCOUNTER — Other Ambulatory Visit (HOSPITAL_COMMUNITY): Payer: Self-pay

## 2022-02-18 ENCOUNTER — Other Ambulatory Visit: Payer: Self-pay

## 2022-02-18 ENCOUNTER — Inpatient Hospital Stay: Payer: 59 | Attending: Hematology and Oncology | Admitting: Adult Health

## 2022-02-18 ENCOUNTER — Encounter: Payer: Self-pay | Admitting: Adult Health

## 2022-02-18 VITALS — BP 118/73 | HR 92 | Temp 97.8°F | Resp 18 | Ht 65.0 in | Wt 212.2 lb

## 2022-02-18 DIAGNOSIS — C50211 Malignant neoplasm of upper-inner quadrant of right female breast: Secondary | ICD-10-CM | POA: Insufficient documentation

## 2022-02-18 DIAGNOSIS — Z801 Family history of malignant neoplasm of trachea, bronchus and lung: Secondary | ICD-10-CM | POA: Diagnosis not present

## 2022-02-18 DIAGNOSIS — R232 Flushing: Secondary | ICD-10-CM | POA: Diagnosis not present

## 2022-02-18 DIAGNOSIS — Z170 Estrogen receptor positive status [ER+]: Secondary | ICD-10-CM | POA: Insufficient documentation

## 2022-02-18 MED ORDER — TAMOXIFEN CITRATE 10 MG PO TABS
10.0000 mg | ORAL_TABLET | Freq: Every day | ORAL | 1 refills | Status: DC
  Filled 2022-02-18: qty 30, 30d supply, fill #0

## 2022-02-18 MED ORDER — TAMOXIFEN CITRATE 10 MG PO TABS
10.0000 mg | ORAL_TABLET | Freq: Every day | ORAL | 1 refills | Status: DC
  Filled 2022-02-18: qty 30, 30d supply, fill #0
  Filled 2022-03-25: qty 30, 30d supply, fill #1

## 2022-02-18 NOTE — Progress Notes (Signed)
SURVIVORSHIP VISIT: ? ? ? ?BRIEF ONCOLOGIC HISTORY:  ?Oncology History  ?Malignant neoplasm of upper-inner quadrant of right breast in female, estrogen receptor positive ([place])  ?04/30/2021 Genetic Testing  ? Negative. Variant of uncertain significance in TP53 (c.704A>G). Genes Tested include: BRCA1, BRCA2; APC, ATM, AXIN2, BAP1, BARD1, BMPR1A, BRIP1, CDH1, CDK4, CDKN2A, CHEK2, CTNNA1, FH, FLCN, HOXB13 (seq only), MEN1, MET, MLH1, MSH2, MSH3 (exlcuding repetitive portions of exon 1), MSH6, MUTYH, NTHL1, PALB2, PMS2, PTEN, RAD51C, RAD51D, SDHA, SDHB, SDHC, SDHD, SMAD4, STK11, TP53, TSC1, TSC2, VHL. ?  ?05/27/2021 Initial Diagnosis  ? Right breast biopsy 05/27/2021: Grade 3 IDC with DCIS ER 35%, PR 80%, Ki-67 60%, HER2 1+ negative,T2 N0 stage IIA ?Oncotype score: 30: High risk ?  ?06/02/2021 Cancer Staging  ? Staging form: Breast, AJCC 8th Edition ?- Clinical: Stage IIA (cT2, cN0, cM0, G3, ER+, PR+, HER2-) - Signed by Nicholas Lose, MD on 06/02/2021 ?Stage prefix: Initial diagnosis ?Histologic grading system: 3 grade system ? ?  ?06/09/2021 Oncotype testing  ? Oncotype DX was obtained on the final surgical sample and the recurrence score of 30 predicts a risk of recurrence outside the breast over the next 9 years of 19%, if the patient's only systemic therapy is an antiestrogen for 5 years.  It also predicts a significant benefit from chemotherapy. ?  ?06/26/2021 - 08/27/2021 Chemotherapy  ?  Patient is on Treatment Plan: BREAST TC Q21D x4 cycles ? ?  ? ?  ?10/16/2021 Definitive Surgery  ? FINAL MICROSCOPIC DIAGNOSIS:  ? ?A. LYMPH NODE, RIGHT AXILLARY, SENTINEL, EXCISION:  ?- 1 lymph node, negative for carcinoma (0/1).  ? ?B. LYMPH NODE, RIGHT AXILLARY, SENTINEL, EXCISION:  ?- 1 lymph node, negative for carcinoma (0/1).  ? ?C. LYMPH NODE, RIGHT AXILLARY, SENTINEL, EXCISION:  ?- 1 lymph node, negative for carcinoma (0/1).  ? ?D. LYMPH NODE, RIGHT AXILLARY, SENTINEL, EXCISION:  ?- 1 lymph node, negative for carcinoma (0/1).   ? ?E. BREAST, RIGHT, LUMPECTOMY:  ?- Residual invasive ductal carcinoma, 0.4 cm in maximal extent, grade 2.  ?- Invasive tumor comes to 0.5 cm of closest (lateral) resection margin.  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  ?- In situ carcinoma comes to 0.1 cm of closest (deep) margin.  ?- Biopsy site changes.  ? ? ?ADDENDUM:  ?PROGNOSTIC INDICATOR RESULTS:  ? ?By immunohistochemistry, HER-2 is EQUIVOCAL (2+).  HER-2 by FISH is  ?pending and will be reported in an addendum.  ? ?Estrogen Receptor: POSITIVE, 40% WEAK STAINING INTENSITY  ?Progesterone Receptor: POSITIVE, 20% MODERATE STAINING INTENSITY  ?Proliferation Marker Ki-67: <1%  ? ?ADDENDUM:  ?FLOURESCENCE IN-SITU HYBRIDIZATION RESULTS:  ?GROUP 5:   HER2 **NEGATIVE**  ?  ?11/17/2021 - 12/15/2021 Radiation Therapy  ? Site Technique Total Dose (Gy) Dose per Fx (Gy) Completed Fx Beam Energies  ?Breast, Right: Breast_R 3D 40.05/40.05 2.67 15/15 10X, 6XFFF  ?Breast, Right: Breast_R_Bst 3D 10/10 2 5/5 6X  ? ?  ? ? ?INTERVAL HISTORY:  ?Ms. Calderwood to review her survivorship care plan detailing her treatment course for breast cancer, as well as monitoring long-term side effects of that treatment, education regarding health maintenance, screening, and overall wellness and health promotion.    ? ?Overall, Amy Pittman reports feeling quite well.  She is taking tamoxifen daily and her main side effect that she experiences with the tamoxifen is hot flashes.  She is taking 10 mg a day.  She says that she has these frequently throughout the day and on a scale of  0-10 they are 3.  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  She is tired this morning because she worked last night. ? ?REVIEW OF SYSTEMS:  ?Review of Systems  ?Constitutional:  Negative for appetite change, chills, fatigue, fever and unexpected weight change.  ?HENT:   Negative for hearing loss, lump/mass and trouble swallowing.   ?Eyes:  Negative for eye  problems and icterus.  ?Respiratory:  Negative for chest tightness, cough and shortness of breath.   ?Cardiovascular:  Negative for chest pain, leg swelling and palpitations.  ?Gastrointestinal:  Negative for abdominal distention, abdominal pain, constipation, diarrhea, nausea and vomiting.  ?Endocrine: Negative for hot flashes.  ?Genitourinary:  Negative for difficulty urinating.   ?Musculoskeletal:  Negative for arthralgias.  ?Skin:  Negative for itching and rash.  ?Neurological:  Negative for dizziness, extremity weakness, headaches and numbness.  ?Hematological:  Negative for adenopathy. Does not bruise/bleed easily.  ?Psychiatric/Behavioral:  Negative for depression. The patient is not nervous/anxious.   ?Breast: Denies any new nodularity, masses, tenderness, nipple changes, or nipple discharge.  ? ? ? ? ?ONCOLOGY TREATMENT TEAM:  ?1. Surgeon:  Dr. Marlou Starks at [facility] ?2. Medical Oncologist: Dr. Lindi Adie  ?3. Radiation Oncologist: Dr. Isidore Moos ?  ? ?PAST MEDICAL/SURGICAL HISTORY:  ?Past Medical History:  ?Diagnosis Date  ? Cancer [facility])   ? Generalized headaches   ? Generalized seizures [facility]) neurologist--  per pt followed by [place] neurologist  ? per pt dx 2010 and per pt none since ,did have work-up negative , felt she was going to sleep Abscent seizure  and triggers are stress and exhaustion  ? Menorrhagia   ? Wears glasses   ? ?Past Surgical History:  ?Procedure Laterality Date  ? ANKLE ARTHROSCOPY Right 07/09/2005     dr Mayer Camel  [facility]  ? w/ Debridement and removal loose body  ? BIOPSY  12/01/2020  ? Procedure: BIOPSY;  Surgeon: Jackquline Denmark, MD;  Location: [facility] ENDOSCOPY;  Service: Endoscopy;;  ? BREAST LUMPECTOMY WITH RADIOACTIVE SEED AND SENTINEL LYMPH NODE BIOPSY Right 10/16/2021  ? Procedure: RIGHT BREAST LUMPECTOMY WITH RADIOACTIVE SEED X2 AND SENTINEL LYMPH NODE BIOPSY;  Surgeon: Jovita Kussmaul, MD;  Location: [place];  Service: General;  Laterality: Right;  ? CHOLECYSTECTOMY  N/A 12/02/2020  ? Procedure: LAPAROSCOPIC CHOLECYSTECTOMY WITH CHOLANGIOGRAM;  Surgeon: Erroll Luna, MD;  Location: [place];  Service: General;  Laterality: N/A;  ? DILATATION & CURETTAGE/HYSTEROSCOPY WITH MYOSURE N/A 11/24/2017  ? Procedure: DILATATION & CURETTAGE/HYSTEROSCOPY;  Surgeon: Bobbye Charleston, MD;  Location[facility];  Service: Gynecology;  Laterality: N/A;  ? ERCP N/A 12/01/2020  ? Procedure: ENDOSCOPIC RETROGRADE CHOLANGIOPANCREATOGRAPHY (ERCP);  Surgeon: Jackquline Denmark, MD;  Location: [facility] ENDOSCOPY;  Service: Endoscopy;  Laterality: N/A;  ? IR IMAGING GUIDED PORT INSERTION  06/25/2021  ? IR REMOVAL TUN ACCESS W/ PORT W/O FL MOD SED  01/08/2022  ? REMOVAL OF STONES  12/01/2020  ? Procedure: REMOVAL OF STONES;  Surgeon: Jackquline Denmark, MD;  Location: [facility] ENDOSCOPY;  Service: Endoscopy;;  ? SPHINCTEROTOMY  12/01/2020  ? Procedure: SPHINCTEROTOMY;  Surgeon: Jackquline Denmark, MD;  Location: [facility] ENDOSCOPY;  Service: Endoscopy;;  ? ? ? ?ALLERGIES:  ?No Known Allergies ? ? ?CURRENT MEDICATIONS:  ?Outpatient Encounter Medications as of 02/18/2022  ?Medication Sig  ? levonorgestrel (MIRENA, 52 MG,) 20 MCG/24HR IUD 1 Intra Uterine Device by Intrauterine route once. Every 7 years  ? lidocaine-prilocaine (EMLA) cream Apply to affected area  once  ? tamoxifen (NOLVADEX) 10 MG tablet Take 1 tablet (10 mg total) by mouth daily.  ? ?No facility-administered encounter medications on file as of 02/18/2022.  ? ? ? ?ONCOLOGIC FAMILY HISTORY:  ?Family History  ?Problem Relation Age of Onset  ? Mental illness Father   ? Arthritis Maternal Grandfather   ? Cancer Maternal Grandfather   ?     lung  ? Heart disease Maternal Grandfather   ? Stroke Maternal Grandfather   ? Hypertension Maternal Grandfather   ? Diabetes Maternal Grandfather   ? ? ? ?GENETIC COUNSELING/TESTING: ?See above ? ?SOCIAL HISTORY:  ?Social History  ? ?Socioeconomic History  ? Marital status: Single  ?  Spouse name: Not on file  ? Number of  children: 0  ? Years of education: 65  ? Highest education level: Not on file  ?Occupational History  ? Occupation: SERVER  ?Tobacco Use  ? Smoking status: Never  ? Smokeless tobacco: Never  ?Vaping Use  ? Vaping U

## 2022-03-25 ENCOUNTER — Other Ambulatory Visit (HOSPITAL_COMMUNITY): Payer: Self-pay

## 2022-03-26 ENCOUNTER — Other Ambulatory Visit (HOSPITAL_COMMUNITY): Payer: Self-pay

## 2022-04-26 ENCOUNTER — Other Ambulatory Visit: Payer: Self-pay | Admitting: Adult Health

## 2022-04-26 ENCOUNTER — Other Ambulatory Visit (HOSPITAL_COMMUNITY): Payer: Self-pay

## 2022-04-26 ENCOUNTER — Ambulatory Visit: Payer: 59 | Attending: Hematology and Oncology

## 2022-04-26 VITALS — Wt 202.5 lb

## 2022-04-26 DIAGNOSIS — C50211 Malignant neoplasm of upper-inner quadrant of right female breast: Secondary | ICD-10-CM

## 2022-04-26 DIAGNOSIS — Z483 Aftercare following surgery for neoplasm: Secondary | ICD-10-CM | POA: Insufficient documentation

## 2022-04-26 MED ORDER — TAMOXIFEN CITRATE 10 MG PO TABS
10.0000 mg | ORAL_TABLET | Freq: Every day | ORAL | 1 refills | Status: DC
  Filled 2022-04-26: qty 30, 30d supply, fill #0
  Filled 2022-05-24: qty 30, 30d supply, fill #1

## 2022-04-26 NOTE — Therapy (Signed)
OUTPATIENT PHYSICAL THERAPY SOZO SCREENING NOTE   Patient Name: Amy Pittman MRN: [id number] DOB:[birth year], 36 y.o., female Today's Date: 04/26/2022  PCP: Hoyt Koch, MD REFERRING PROVIDER: Hoyt Koch, *   PT End of Session - 04/26/22 1544     Visit Number 2   # unchanged due to screen only   PT Start Time 4259    PT Stop Time 1547    PT Time Calculation (min) 5 min    Activity Tolerance Patient tolerated treatment well    Behavior During Therapy [facility] for tasks assessed/performed             Past Medical History:  Diagnosis Date   Cancer ([place])    Generalized headaches    Generalized seizures [facility]) neurologist--  per pt followed by [place] neurologist   per pt dx 2010 and per pt none since ,did have work-up negative , felt she was going to sleep Abscent seizure  and triggers are stress and exhaustion   Menorrhagia    Wears glasses    Past Surgical History:  Procedure Laterality Date   ANKLE ARTHROSCOPY Right 07/09/2005     dr Mayer Camel  [facility]   w/ Debridement and removal loose body   BIOPSY  12/01/2020   Procedure: BIOPSY;  Surgeon: Jackquline Denmark, MD;  Location: [facility] ENDOSCOPY;  Service: Endoscopy;;   BREAST LUMPECTOMY WITH RADIOACTIVE SEED AND SENTINEL LYMPH NODE BIOPSY Right 10/16/2021   Procedure: RIGHT BREAST LUMPECTOMY WITH RADIOACTIVE SEED X2 AND SENTINEL LYMPH NODE BIOPSY;  Surgeon: Jovita Kussmaul, MD;  Location: [place];  Service: General;  Laterality: Right;   CHOLECYSTECTOMY N/A 12/02/2020   Procedure: LAPAROSCOPIC CHOLECYSTECTOMY WITH CHOLANGIOGRAM;  Surgeon: Erroll Luna, MD;  Location: [place];  Service: General;  Laterality: N/A;   [place] N/A 11/24/2017   Procedure: DILATATION & CURETTAGE/HYSTEROSCOPY;  Surgeon: Bobbye Charleston, MD;  Location: [place];  Service: Gynecology;  Laterality: N/A;   ERCP N/A 12/01/2020   Procedure: ENDOSCOPIC RETROGRADE  CHOLANGIOPANCREATOGRAPHY (ERCP);  Surgeon: Jackquline Denmark, MD;  Location: [facility] ENDOSCOPY;  Service: Endoscopy;  Laterality: N/A;   IR IMAGING GUIDED PORT INSERTION  06/25/2021   IR REMOVAL TUN ACCESS W/ PORT W/O FL MOD SED  01/08/2022   REMOVAL OF STONES  12/01/2020   Procedure: REMOVAL OF STONES;  Surgeon: Jackquline Denmark, MD;  Location: [facility] ENDOSCOPY;  Service: Endoscopy;;   SPHINCTEROTOMY  12/01/2020   Procedure: Joan Mayans;  Surgeon: Jackquline Denmark, MD;  Location: [facility] ENDOSCOPY;  Service: Endoscopy;;   Patient Active Problem List   Diagnosis Date Noted   Genetic testing 08/06/2021   Malignant neoplasm of upper-inner quadrant of right breast in female, estrogen receptor positive ([place]) 06/02/2021   RUQ abdominal pain    Gallstones    Choledocholithiasis    Pancreatitis 11/29/2020   Cellulitis of left upper eyelid 10/11/2018   Low back pain 05/28/2015   Awareness alteration, transient 03/07/2015   Routine health maintenance 05/10/2012   Generalized seizures ([place])    Irregular menses     REFERRING DIAG: right breast cancer at risk for lymphedema  THERAPY DIAG:  Aftercare following surgery for neoplasm  PERTINENT HISTORY: Patient reports she was diagnosed on 05/27/2021 with right grade III invasive ductal carcinoma breast cancer. She underwent neoadjuvant chemotherapy 06/26/2021 - 08/27/2021. Then she had a right lumpectomy and sentinel node biopsy (4 negative nodes) on 10/16/2021. It is ER/PR positive and HER2 negative with a Ki67 of 60%. She has a  hx of a seizure disorder but no seizures since 2010  PRECAUTIONS: right UE Lymphedema risk, None  SUBJECTIVE: Pt returns for her 3 month L-Dex screen.   PAIN:  Are you having pain? No  SOZO SCREENING: Patient was assessed today using the SOZO machine to determine the lymphedema index score. This was compared to her baseline score. It was determined that she is within the recommended range when compared to her baseline and no further action is  needed at this time. She will continue SOZO screenings. These are done every 3 months for 2 years post operatively followed by every 6 months for 2 years, and then annually.     [name], PTA 04/26/2022, 3:46 PM

## 2022-04-30 ENCOUNTER — Telehealth: Payer: Self-pay | Admitting: Adult Health

## 2022-04-30 NOTE — Telephone Encounter (Signed)
Scheduled appointment per 3/17 los. Left message.

## 2022-05-24 ENCOUNTER — Other Ambulatory Visit (HOSPITAL_COMMUNITY): Payer: Self-pay

## 2022-05-25 ENCOUNTER — Ambulatory Visit
Admission: RE | Admit: 2022-05-25 | Discharge: 2022-05-25 | Disposition: A | Discharge status: Home / Self Care | Payer: 59 | Source: Ambulatory Visit | Attending: Adult Health | Admitting: Adult Health

## 2022-05-25 DIAGNOSIS — C50211 Malignant neoplasm of upper-inner quadrant of right female breast: Secondary | ICD-10-CM

## 2022-05-25 DIAGNOSIS — R922 Inconclusive mammogram: Secondary | ICD-10-CM | POA: Diagnosis not present

## 2022-05-25 DIAGNOSIS — Z853 Personal history of malignant neoplasm of breast: Secondary | ICD-10-CM | POA: Diagnosis not present

## 2022-05-25 HISTORY — DX: Personal history of antineoplastic chemotherapy: Z92.21

## 2022-05-25 HISTORY — DX: Personal history of irradiation: Z92.3

## 2022-05-25 HISTORY — DX: Malignant neoplasm of unspecified site of unspecified female breast: C50.919

## 2022-06-26 ENCOUNTER — Other Ambulatory Visit (HOSPITAL_COMMUNITY): Payer: Self-pay

## 2022-06-26 ENCOUNTER — Other Ambulatory Visit: Payer: Self-pay | Admitting: Adult Health

## 2022-06-26 DIAGNOSIS — Z170 Estrogen receptor positive status [ER+]: Secondary | ICD-10-CM

## 2022-06-26 MED ORDER — TAMOXIFEN CITRATE 10 MG PO TABS
10.0000 mg | ORAL_TABLET | Freq: Every day | ORAL | 1 refills | Status: DC
  Filled 2022-06-26: qty 30, 30d supply, fill #0
  Filled 2022-07-30: qty 30, 30d supply, fill #1

## 2022-07-26 ENCOUNTER — Encounter: Payer: Self-pay | Admitting: Rehabilitation

## 2022-07-26 ENCOUNTER — Ambulatory Visit: Payer: 59 | Attending: General Surgery | Admitting: Rehabilitation

## 2022-07-26 DIAGNOSIS — Z483 Aftercare following surgery for neoplasm: Secondary | ICD-10-CM | POA: Insufficient documentation

## 2022-07-26 DIAGNOSIS — C50211 Malignant neoplasm of upper-inner quadrant of right female breast: Secondary | ICD-10-CM | POA: Insufficient documentation

## 2022-07-26 DIAGNOSIS — Z170 Estrogen receptor positive status [ER+]: Secondary | ICD-10-CM | POA: Insufficient documentation

## 2022-07-26 NOTE — Therapy (Signed)
OUTPATIENT PHYSICAL THERAPY SOZO SCREENING NOTE   Patient Name: Amy Pittman MRN: [id number] DOB:31-Jan-1986, 36 y.o., female Today's Date: 07/26/2022  PCP: Hoyt Koch, MD REFERRING PROVIDER: Jovita Kussmaul, MD   PT End of Session - 07/26/22 1556     Visit Number 2   screen   PT Start Time 1557    PT Stop Time 1601    PT Time Calculation (min) 4 min    Activity Tolerance Patient tolerated treatment well    Behavior During Therapy [facility] for tasks assessed/performed             Past Medical History:  Diagnosis Date   Breast cancer ([place])    Cancer ([place])    Generalized headaches    Generalized seizures [facility]) neurologist--  per pt followed by [place] neurologist   per pt dx 2010 and per pt none since ,did have work-up negative , felt she was going to sleep Abscent seizure  and triggers are stress and exhaustion   Menorrhagia    Personal history of chemotherapy    Personal history of radiation therapy    Wears glasses    Past Surgical History:  Procedure Laterality Date   ANKLE ARTHROSCOPY Right 07/09/2005     dr Mayer Camel  [facility]   w/ Debridement and removal loose body   BIOPSY  12/01/2020   Procedure: BIOPSY;  Surgeon: Jackquline Denmark, MD;  Location: [facility] ENDOSCOPY;  Service: Endoscopy;;   BREAST LUMPECTOMY Right 10/16/2021   BREAST LUMPECTOMY WITH RADIOACTIVE SEED AND SENTINEL LYMPH NODE BIOPSY Right 10/16/2021   Procedure: RIGHT BREAST LUMPECTOMY WITH RADIOACTIVE SEED X2 AND SENTINEL LYMPH NODE BIOPSY;  Surgeon: Amy Kussmaul, MD;  Location: [place];  Service: General;  Laterality: Right;   CHOLECYSTECTOMY N/A 12/02/2020   Procedure: LAPAROSCOPIC CHOLECYSTECTOMY WITH CHOLANGIOGRAM;  Surgeon: Erroll Luna, MD;  Location: [place];  Service: General;  Laterality: N/A;   [place] N/A 11/24/2017   Procedure: DILATATION & CURETTAGE/HYSTEROSCOPY;  Surgeon: Bobbye Charleston, MD;  Location: [place];  Service: Gynecology;  Laterality: N/A;   ERCP N/A 12/01/2020   Procedure: ENDOSCOPIC RETROGRADE CHOLANGIOPANCREATOGRAPHY (ERCP);  Surgeon: Jackquline Denmark, MD;  Location: [facility] ENDOSCOPY;  Service: Endoscopy;  Laterality: N/A;   IR IMAGING GUIDED PORT INSERTION  06/25/2021   IR REMOVAL TUN ACCESS W/ PORT W/O FL MOD SED  01/08/2022   REMOVAL OF STONES  12/01/2020   Procedure: REMOVAL OF STONES;  Surgeon: Jackquline Denmark, MD;  Location: [facility] ENDOSCOPY;  Service: Endoscopy;;   SPHINCTEROTOMY  12/01/2020   Procedure: Joan Mayans;  Surgeon: Jackquline Denmark, MD;  Location: [facility] ENDOSCOPY;  Service: Endoscopy;;   Patient Active Problem List   Diagnosis Date Noted   Genetic testing 08/06/2021   Malignant neoplasm of upper-inner quadrant of right breast in female, estrogen receptor positive (Amy Pittman) 06/02/2021   RUQ abdominal pain    Gallstones    Choledocholithiasis    Pancreatitis 11/29/2020   Cellulitis of left upper eyelid 10/11/2018   Low back pain 05/28/2015   Awareness alteration, transient 03/07/2015   Routine health maintenance 05/10/2012   Generalized seizures (Amy Pittman)    Irregular menses     REFERRING DIAG: right breast cancer at risk for lymphedema  THERAPY DIAG:  Aftercare following surgery for neoplasm  Malignant neoplasm of upper-inner quadrant of right breast in female, estrogen receptor positive (Amy Pittman)  PERTINENT HISTORY: Patient reports she was diagnosed on 05/27/2021 with right grade III invasive ductal carcinoma breast cancer.  She underwent neoadjuvant chemotherapy 06/26/2021 - 08/27/2021. Then she had a right lumpectomy and sentinel node biopsy (4 negative nodes) on 10/16/2021. It is ER/PR positive and HER2 negative with a Ki67 of 60%. She has a hx of a seizure disorder but no seizures since 2010  PRECAUTIONS: right UE Lymphedema risk, None  SUBJECTIVE: Pt returns for her 3 month L-Dex screen.   PAIN:  Are you having pain? No  SOZO SCREENING: Patient was assessed  today using the SOZO machine to determine the lymphedema index score. This was compared to her baseline score. It was determined that she is within the recommended range when compared to her baseline and no further action is needed at this time. She will continue SOZO screenings. These are done every 3 months for 2 years post operatively followed by every 6 months for 2 years, and then annually.     Stark Bray, PT 07/26/2022, 4:01 PM   -2.3

## 2022-07-30 ENCOUNTER — Other Ambulatory Visit (HOSPITAL_COMMUNITY): Payer: Self-pay

## 2022-08-18 NOTE — Progress Notes (Signed)
Patient Care Team: Hoyt Koch, MD as PCP - General (Internal Medicine) Frederik Pear, MD (Orthopedic Surgery) Nicholas Lose, MD as Consulting Physician (Hematology and Oncology) Eppie Gibson, MD as Attending Physician (Radiation Oncology) Jovita Kussmaul, MD as Consulting Physician (General Surgery)  DIAGNOSIS:  Encounter Diagnosis  Name Primary?   Malignant neoplasm of upper-inner quadrant of right breast in female, estrogen receptor positive ([place])     SUMMARY OF ONCOLOGIC HISTORY: Oncology History  Malignant neoplasm of upper-inner quadrant of right breast in female, estrogen receptor positive ([place])  04/30/2021 Genetic Testing   Negative. Variant of uncertain significance in TP53 (c.704A>G). Genes Tested include: BRCA1, BRCA2; APC, ATM, AXIN2, BAP1, BARD1, BMPR1A, BRIP1, CDH1, CDK4, CDKN2A, CHEK2, CTNNA1, FH, FLCN, HOXB13 (seq only), MEN1, MET, MLH1, MSH2, MSH3 (exlcuding repetitive portions of exon 1), MSH6, MUTYH, NTHL1, PALB2, PMS2, PTEN, RAD51C, RAD51D, SDHA, SDHB, SDHC, SDHD, SMAD4, STK11, TP53, TSC1, TSC2, VHL.   05/27/2021 Initial Diagnosis   Right breast biopsy 05/27/2021: Grade 3 IDC with DCIS ER 35%, PR 80%, Ki-67 60%, HER2 1+ negative,T2 N0 stage IIA Oncotype score: 30: High risk   06/02/2021 Cancer Staging   Staging form: Breast, AJCC 8th Edition - Clinical: Stage IIA (cT2, cN0, cM0, G3, ER+, PR+, HER2-) - Signed by Nicholas Lose, MD on 06/02/2021 Stage prefix: Initial diagnosis Histologic grading system: 3 grade system   06/02/2021 - 06/16/2021 Anti-estrogen oral therapy   Neoadj tamoxifen   06/09/2021 Oncotype testing   Oncotype DX was obtained on the final surgical sample and the recurrence score of 30 predicts a risk of recurrence outside the breast over the next 9 years of 19%, if the patient's only systemic therapy is an antiestrogen for 5 years.  It also predicts a significant benefit from chemotherapy.   06/26/2021 - 08/27/2021 Chemotherapy    Patient is  on Treatment Plan: BREAST TC Q21D x4 cycles       10/16/2021 Definitive Surgery   FINAL MICROSCOPIC DIAGNOSIS:   A. LYMPH NODE, RIGHT AXILLARY, SENTINEL, EXCISION:  - 1 lymph node, negative for carcinoma (0/1).   B. LYMPH NODE, RIGHT AXILLARY, SENTINEL, EXCISION:  - 1 lymph node, negative for carcinoma (0/1).   C. LYMPH NODE, RIGHT AXILLARY, SENTINEL, EXCISION:  - 1 lymph node, negative for carcinoma (0/1).   D. LYMPH NODE, RIGHT AXILLARY, SENTINEL, EXCISION:  - 1 lymph node, negative for carcinoma (0/1).   E. BREAST, RIGHT, LUMPECTOMY:  - Residual invasive ductal carcinoma, 0.4 cm in maximal extent, grade 2.  - Invasive tumor comes to 0.5 cm of closest (lateral) resection margin.  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  - In situ carcinoma comes to 0.1 cm of closest (deep) margin.  - Biopsy site changes.    ADDENDUM:  PROGNOSTIC INDICATOR RESULTS:   By immunohistochemistry, HER-2 is EQUIVOCAL (2+).  HER-2 by FISH is  pending and will be reported in an addendum.   Estrogen Receptor: POSITIVE, 40% WEAK STAINING INTENSITY  Progesterone Receptor: POSITIVE, 20% MODERATE STAINING INTENSITY  Proliferation Marker Ki-67: <1%   ADDENDUM:  FLOURESCENCE IN-SITU HYBRIDIZATION RESULTS:  GROUP 5:   HER2 **NEGATIVE**    11/17/2021 - 12/15/2021 Radiation Therapy   Site Technique Total Dose (Gy) Dose per Fx (Gy) Completed Fx Beam Energies  Breast, Right: Breast_R 3D 40.05/40.05 2.67 15/15 10X, 6XFFF  Breast, Right: Breast_R_Bst 3D 10/10 2 5/5 6X     12/18/2021 -  Anti-estrogen oral therapy   Adjuvant tamoxifen     CHIEF COMPLIANT: Follow-up  right breast cancer on Tamoxifen    INTERVAL HISTORY: Amy Pittman is a 36 year old above-mentioned history of right breast cancer treated with neoadjuvant chemotherapy followed by lumpectomy. She presents to the clinic today for a follow-up.  She denies any lumps or nodules in the breast or axilla.  She has  been tolerating 10 mg tamoxifen extremely well without any problems or concerns.    ALLERGIES:  has No Known Allergies.  MEDICATIONS:  Current Outpatient Medications  Medication Sig Dispense Refill   levonorgestrel (MIRENA, 52 MG,) 20 MCG/24HR IUD 1 Intra Uterine Device by Intrauterine route once. Every 7 years     tamoxifen (NOLVADEX) 20 MG tablet Take 1 tablet (20 mg total) by mouth daily. 90 tablet 3   No current facility-administered medications for this visit.    PHYSICAL EXAMINATION: ECOG PERFORMANCE STATUS: 1 - Symptomatic but completely ambulatory  Vitals:   08/23/22 1455  BP: 127/69  Pulse: 72  Resp: 18  Temp: 97.7 F (36.5 C)  SpO2: 97%   Filed Weights   08/23/22 1455  Weight: 197 lb 3.2 oz (89.4 kg)    BREAST: No palpable masses or nodules in either right or left breasts. No palpable axillary supraclavicular or infraclavicular adenopathy no breast tenderness or nipple discharge. (exam performed in the presence of a chaperone)  LABORATORY DATA:  I have reviewed the data as listed    Latest Ref Rng & Units 08/27/2021    8:29 AM 08/06/2021    9:55 AM 07/16/2021    9:56 AM  CMP  Glucose 70 - 99 mg/dL 113  91  97   BUN 6 - 20 mg/dL 7  11  14    Creatinine 0.44 - 1.00 mg/dL 0.65  0.70  0.67   Sodium 135 - 145 mmol/L 143  143  141   Potassium 3.5 - 5.1 mmol/L 3.7  3.4  3.5   Chloride 98 - 111 mmol/L 110  108  106   CO2 22 - 32 mmol/L 21  23  24    Calcium 8.9 - 10.3 mg/dL 9.7  9.4  9.1   Total Protein 6.5 - 8.1 g/dL 7.3  7.0  7.2   Total Bilirubin 0.3 - 1.2 mg/dL 0.3  0.4  0.4   Alkaline Phos 38 - 126 U/L 64  54  50   AST 15 - 41 U/L 12  15  14    ALT 0 - 44 U/L 15  18  22      Lab Results  Component Value Date   WBC 9.5 08/27/2021   HGB 11.2 (L) 08/27/2021   HCT 33.5 (L) 08/27/2021   MCV 90.3 08/27/2021   PLT 332 08/27/2021   NEUTROABS 7.8 (H) 08/27/2021    ASSESSMENT & PLAN:  Malignant neoplasm of upper-inner quadrant of right breast in female,  estrogen receptor positive ([place]) 05/27/2021:Right breast biopsy 05/27/2021: 4 cm of grade 3 IDC with DCIS ER 35%, PR 80%, Ki-67 60%, HER2 1+ negative,T2 N0 stage IIA Oncotype score: 30: High risk Neoadjuvant chemo with Taxotere and Cytoxan x4 cycles completed 08/27/2021    10/16/2021:Right lumpectomy: Residual grade 2 IDC 0.4 cm, residual DCIS high-grade, margins negative, 0/4 lymph nodes negative, ER 40% weak staining, PR 20% moderate staining, Ki-67 less than 1%, HER2 equal vocal 2+ by IHC, FISH pending, RCB class I   Treatment plan: 1. Adjuvant radiation completed 12/15/21 2. we stopped Zoladex injections because the patient is contemplating on having children.  She is currently having Mirena.  Once  she makes a decision on pregnancy she will stop the Mirena and will see if her menses can be resumed.  I discussed with her about talking with her primary care physician about it and if the decision is made to proceed with pregnancy then we will also stop tamoxifen at that time.  Current treatment: Tamoxifen 10 mg daily.  I encouraged her to increase it to 20 mg daily.  I sent a new prescription for that.  Breast cancer surveillance: 1.  Breast exam 08/23/2022: Benign 2. Mammogram 05/25/2022: Benign breast density category C  He also recommended [employer]ater[employer] testing for minimal residual disease. Return to clinic in 1 year for follow-up   No orders of the defined types were placed in this encounter.  The patient has a good understanding of the overall plan. she agrees with it. she will call with any problems that may develop before the next visit here. Total time spent: 30 mins including face to face time and time spent for planning, charting and co-ordination of care   Harriette Ohara, MD 08/23/22    I Gardiner Coins am scribing for Dr. Lindi Adie  I have reviewed the above documentation for accuracy and completeness, and I agree with the above.

## 2022-08-23 ENCOUNTER — Other Ambulatory Visit (HOSPITAL_COMMUNITY): Payer: Self-pay

## 2022-08-23 ENCOUNTER — Other Ambulatory Visit: Payer: Self-pay

## 2022-08-23 ENCOUNTER — Inpatient Hospital Stay: Payer: 59 | Admitting: Hematology and Oncology

## 2022-08-23 ENCOUNTER — Inpatient Hospital Stay: Payer: 59 | Attending: Hematology and Oncology | Admitting: Hematology and Oncology

## 2022-08-23 DIAGNOSIS — C50211 Malignant neoplasm of upper-inner quadrant of right female breast: Secondary | ICD-10-CM

## 2022-08-23 DIAGNOSIS — Z853 Personal history of malignant neoplasm of breast: Secondary | ICD-10-CM | POA: Diagnosis not present

## 2022-08-23 DIAGNOSIS — Z170 Estrogen receptor positive status [ER+]: Secondary | ICD-10-CM

## 2022-08-23 DIAGNOSIS — Z79810 Long term (current) use of selective estrogen receptor modulators (SERMs): Secondary | ICD-10-CM | POA: Insufficient documentation

## 2022-08-23 MED ORDER — TAMOXIFEN CITRATE 20 MG PO TABS
20.0000 mg | ORAL_TABLET | Freq: Every day | ORAL | 3 refills | Status: DC
  Filled 2022-08-23: qty 90, 90d supply, fill #0
  Filled 2022-12-15: qty 90, 90d supply, fill #1
  Filled 2023-03-28: qty 90, 90d supply, fill #2

## 2022-08-23 NOTE — Assessment & Plan Note (Signed)
05/27/2021:Right breast biopsy 05/27/2021:4 cm of grade 3 IDC with DCIS ER 35%, PR 80%, Ki-67 60%, HER2 1+ negative,T2 N0 stageIIA Oncotype score: 30: High risk Neoadjuvant chemo with Taxotere and Cytoxan x4 cycles completed 08/27/2021   10/16/2021:Right lumpectomy: Residual grade 2 IDC 0.4 cm, residual DCIS high-grade, margins negative, 0/4 lymph nodes negative, ER 40% weak staining, PR 20% moderate staining, Ki-67 less than 1%, HER2 equal vocal 2+ by IHC, FISH pending, RCB class I  Treatment plan: 1.Adjuvant radiation completed 12/15/21 2.we will stop Zoladex injections because the patient is contemplating on having children.   Breast cancer surveillance: 1.  Breast exam 08/23/2022: Benign 2. Mammogram 05/25/2022: Benign breast density category C  Return to clinic in 1 year for follow-up

## 2022-08-23 NOTE — Progress Notes (Signed)
[employer] requisition & all supporting documents faxed to [phone] per MD. Fax confirmation received.

## 2022-08-24 ENCOUNTER — Other Ambulatory Visit (HOSPITAL_COMMUNITY): Payer: Self-pay

## 2022-08-31 ENCOUNTER — Telehealth: Payer: Self-pay

## 2022-08-31 ENCOUNTER — Encounter: Payer: Self-pay | Admitting: Hematology and Oncology

## 2022-08-31 NOTE — Telephone Encounter (Signed)
Pt's signatera testing resulted as  "Quality not sufficient- Request for additional tissue."  Attempted to call pt to let her know. LVM for call back to advise of these results.

## 2022-09-06 ENCOUNTER — Encounter: Payer: Self-pay | Admitting: *Deleted

## 2022-09-06 NOTE — Progress Notes (Signed)
Receive a message from [employer] representative stating patient did not respond to their call to schedule mobile phlebotomy draw.  Representative states patient will need to reach out to [employer] directly at [phone] option #1 [phone] to schedule if patient wishes to proceed.   

## 2022-09-07 ENCOUNTER — Encounter: Payer: Self-pay | Admitting: Hematology and Oncology

## 2022-11-01 ENCOUNTER — Ambulatory Visit: Payer: 59 | Attending: General Surgery

## 2022-11-01 VITALS — Wt 195.2 lb

## 2022-11-01 DIAGNOSIS — Z483 Aftercare following surgery for neoplasm: Secondary | ICD-10-CM | POA: Insufficient documentation

## 2022-11-01 NOTE — Therapy (Signed)
OUTPATIENT PHYSICAL THERAPY SOZO SCREENING NOTE   Patient Name: Amy Pittman MRN: [id number] DOB:[birth year], 36 y.o., female Today's Date: 11/01/2022  PCP: Hoyt Koch, MD REFERRING PROVIDER: Jovita Kussmaul, MD   PT End of Session - 11/01/22 1548     Visit Number 2   # unchanged due to screen only   PT Start Time 1546    PT Stop Time 1550    PT Time Calculation (min) 4 min    Activity Tolerance Patient tolerated treatment well    Behavior During Therapy WFL for tasks assessed/performed             Past Medical History:  Diagnosis Date   Breast cancer ([place])    Cancer ([place])    Generalized headaches    Generalized seizures [facility]) neurologist--  per pt followed by [place] neurologist   per pt dx 2010 and per pt none since ,did have work-up negative , felt she was going to sleep Abscent seizure  and triggers are stress and exhaustion   Menorrhagia    Personal history of chemotherapy    Personal history of radiation therapy    Wears glasses    Past Surgical History:  Procedure Laterality Date   ANKLE ARTHROSCOPY Right 07/09/2005     dr Mayer Camel  [facility]   w/ Debridement and removal loose body   BIOPSY  12/01/2020   Procedure: BIOPSY;  Surgeon: Jackquline Denmark, MD;  Location: [facility] ENDOSCOPY;  Service: Endoscopy;;   BREAST LUMPECTOMY Right 10/16/2021   BREAST LUMPECTOMY WITH RADIOACTIVE SEED AND SENTINEL LYMPH NODE BIOPSY Right 10/16/2021   Procedure: RIGHT BREAST LUMPECTOMY WITH RADIOACTIVE SEED X2 AND SENTINEL LYMPH NODE BIOPSY;  Surgeon: Jovita Kussmaul, MD;  Location: [place];  Service: General;  Laterality: Right;   CHOLECYSTECTOMY N/A 12/02/2020   Procedure: LAPAROSCOPIC CHOLECYSTECTOMY WITH CHOLANGIOGRAM;  Surgeon: Erroll Luna, MD;  Location: [place];  Service: General;  Laterality: N/A;   [place] N/A 11/24/2017   Procedure: DILATATION & CURETTAGE/HYSTEROSCOPY;  Surgeon: Bobbye Charleston, MD;   Location: [place];  Service: Gynecology;  Laterality: N/A;   ERCP N/A 12/01/2020   Procedure: ENDOSCOPIC RETROGRADE CHOLANGIOPANCREATOGRAPHY (ERCP);  Surgeon: Jackquline Denmark, MD;  Location: [facility] ENDOSCOPY;  Service: Endoscopy;  Laterality: N/A;   IR IMAGING GUIDED PORT INSERTION  06/25/2021   IR REMOVAL TUN ACCESS W/ PORT W/O FL MOD SED  01/08/2022   REMOVAL OF STONES  12/01/2020   Procedure: REMOVAL OF STONES;  Surgeon: Jackquline Denmark, MD;  Location: [facility] ENDOSCOPY;  Service: Endoscopy;;   SPHINCTEROTOMY  12/01/2020   Procedure: Joan Mayans;  Surgeon: Jackquline Denmark, MD;  Location: [facility] ENDOSCOPY;  Service: Endoscopy;;   Patient Active Problem List   Diagnosis Date Noted   Genetic testing 08/06/2021   Malignant neoplasm of upper-inner quadrant of right breast in female, estrogen receptor positive ([place]) 06/02/2021   RUQ abdominal pain    Gallstones    Choledocholithiasis    Pancreatitis 11/29/2020   Cellulitis of left upper eyelid 10/11/2018   Low back pain 05/28/2015   Awareness alteration, transient 03/07/2015   Routine health maintenance 05/10/2012   Generalized seizures ([place])    Irregular menses     REFERRING DIAG: right breast cancer at risk for lymphedema  THERAPY DIAG: Aftercare following surgery for neoplasm  PERTINENT HISTORY: Patient reports she was diagnosed on 05/27/2021 with right grade III invasive ductal carcinoma breast cancer. She underwent neoadjuvant chemotherapy 06/26/2021 - 08/27/2021. Then she had a  right lumpectomy and sentinel node biopsy (4 negative nodes) on 10/16/2021. It is ER/PR positive and HER2 negative with a Ki67 of 60%. She has a hx of a seizure disorder but no seizures since 2010  PRECAUTIONS: right UE Lymphedema risk, None  SUBJECTIVE: Pt returns for her 3 month L-Dex screen.   PAIN:  Are you having pain? No  SOZO SCREENING: Patient was assessed today using the SOZO machine to determine the lymphedema index score. This was compared  to her baseline score. It was determined that she is within the recommended range when compared to her baseline and no further action is needed at this time. She will continue SOZO screenings. These are done every 3 months for 2 years post operatively followed by every 6 months for 2 years, and then annually.   L-DEX FLOWSHEETS - 11/01/22 1500       L-DEX LYMPHEDEMA SCREENING   Measurement Type Unilateral    L-DEX MEASUREMENT EXTREMITY Upper Extremity    POSITION  Standing    DOMINANT SIDE Right    At Risk Side Right    BASELINE SCORE (UNILATERAL) -2.4    L-DEX SCORE (UNILATERAL) -1.9    VALUE CHANGE (UNILAT) 0.5               [name], PTA 11/01/2022, 3:50 PM   -2.3

## 2022-12-14 ENCOUNTER — Encounter: Payer: Self-pay | Admitting: *Deleted

## 2022-12-14 NOTE — Progress Notes (Signed)
Receive a message from [employer] representative stating patient did not respond to their call to schedule mobile phlebotomy draw.  Representative states patient will need to reach out to [employer] directly at [phone] option #1 [phone] to schedule if patient wishes to proceed.   

## 2023-01-31 ENCOUNTER — Ambulatory Visit: Payer: Commercial Managed Care - PPO | Attending: General Surgery

## 2023-01-31 VITALS — Wt 192.4 lb

## 2023-01-31 DIAGNOSIS — Z483 Aftercare following surgery for neoplasm: Secondary | ICD-10-CM | POA: Insufficient documentation

## 2023-01-31 NOTE — Therapy (Signed)
OUTPATIENT PHYSICAL THERAPY SOZO SCREENING NOTE   Patient Name: Amy Pittman MRN: [id number] DOB:August 03, 1986, 37 y.o., female Today's Date: 01/31/2023  PCP: Hoyt Koch, MD REFERRING PROVIDER: Jovita Kussmaul, MD   PT End of Session - 01/31/23 1624     Visit Number 2   # unchanged due to screen only   PT Start Time 1622    PT Stop Time 1626    PT Time Calculation (min) 4 min    Activity Tolerance Patient tolerated treatment well    Behavior During Therapy WFL for tasks assessed/performed             Past Medical History:  Diagnosis Date   Breast cancer ([place])    Cancer ([place])    Generalized headaches    Generalized seizures [facility]) neurologist--  per pt followed by [place] neurologist   per pt dx 2010 and per pt none since ,did have work-up negative , felt she was going to sleep Abscent seizure  and triggers are stress and exhaustion   Menorrhagia    Personal history of chemotherapy    Personal history of radiation therapy    Wears glasses    Past Surgical History:  Procedure Laterality Date   ANKLE ARTHROSCOPY Right 07/09/2005     dr Mayer Camel  [facility]   w/ Debridement and removal loose body   BIOPSY  12/01/2020   Procedure: BIOPSY;  Surgeon: Jackquline Denmark, MD;  Location: [facility] ENDOSCOPY;  Service: Endoscopy;;   BREAST LUMPECTOMY Right 10/16/2021   BREAST LUMPECTOMY WITH RADIOACTIVE SEED AND SENTINEL LYMPH NODE BIOPSY Right 10/16/2021   Procedure: RIGHT BREAST LUMPECTOMY WITH RADIOACTIVE SEED X2 AND SENTINEL LYMPH NODE BIOPSY;  Surgeon: Jovita Kussmaul, MD;  Location: [place];  Service: General;  Laterality: Right;   CHOLECYSTECTOMY N/A 12/02/2020   Procedure: LAPAROSCOPIC CHOLECYSTECTOMY WITH CHOLANGIOGRAM;  Surgeon: Erroll Luna, MD;  Location: [place];  Service: General;  Laterality: N/A;   Agenda N/A 11/24/2017   Procedure: DILATATION & CURETTAGE/HYSTEROSCOPY;  Surgeon: Bobbye Charleston, MD;   Location: [place];  Service: Gynecology;  Laterality: N/A;   ERCP N/A 12/01/2020   Procedure: ENDOSCOPIC RETROGRADE CHOLANGIOPANCREATOGRAPHY (ERCP);  Surgeon: Jackquline Denmark, MD;  Location: [facility] ENDOSCOPY;  Service: Endoscopy;  Laterality: N/A;   IR IMAGING GUIDED PORT INSERTION  06/25/2021   IR REMOVAL TUN ACCESS W/ PORT W/O FL MOD SED  01/08/2022   REMOVAL OF STONES  12/01/2020   Procedure: REMOVAL OF STONES;  Surgeon: Jackquline Denmark, MD;  Location: [facility] ENDOSCOPY;  Service: Endoscopy;;   SPHINCTEROTOMY  12/01/2020   Procedure: Joan Mayans;  Surgeon: Jackquline Denmark, MD;  Location: [facility] ENDOSCOPY;  Service: Endoscopy;;   Patient Active Problem List   Diagnosis Date Noted   Genetic testing 08/06/2021   Malignant neoplasm of upper-inner quadrant of right breast in female, estrogen receptor positive ([place]) 06/02/2021   RUQ abdominal pain    Gallstones    Choledocholithiasis    Pancreatitis 11/29/2020   Cellulitis of left upper eyelid 10/11/2018   Low back pain 05/28/2015   Awareness alteration, transient 03/07/2015   Routine health maintenance 05/10/2012   Generalized seizures ([place])    Irregular menses     REFERRING DIAG: right breast cancer at risk for lymphedema  THERAPY DIAG: Aftercare following surgery for neoplasm  PERTINENT HISTORY: Patient reports she was diagnosed on 05/27/2021 with right grade III invasive ductal carcinoma breast cancer. She underwent neoadjuvant chemotherapy 06/26/2021 - 08/27/2021. Then she had a  right lumpectomy and sentinel node biopsy (4 negative nodes) on 10/16/2021. It is ER/PR positive and HER2 negative with a Ki67 of 60%. She has a hx of a seizure disorder but no seizures since 2010  PRECAUTIONS: right UE Lymphedema risk, None  SUBJECTIVE: Pt returns for her 3 month L-Dex screen.   PAIN:  Are you having pain? No  SOZO SCREENING: Patient was assessed today using the SOZO machine to determine the lymphedema index score. This was compared  to her baseline score. It was determined that she is within the recommended range when compared to her baseline and no further action is needed at this time. She will continue SOZO screenings. These are done every 3 months for 2 years post operatively followed by every 6 months for 2 years, and then annually.   L-DEX FLOWSHEETS - 01/31/23 1600       L-DEX LYMPHEDEMA SCREENING   Measurement Type Unilateral    L-DEX MEASUREMENT EXTREMITY Upper Extremity    POSITION  Standing    DOMINANT SIDE Right    At Risk Side Right    BASELINE SCORE (UNILATERAL) -2.4    L-DEX SCORE (UNILATERAL) 0.8    VALUE CHANGE (UNILAT) 3.2               Amy Pittman, PTA 01/31/2023, 4:25 PM   -2.3

## 2023-02-07 ENCOUNTER — Telehealth: Payer: Self-pay | Admitting: Internal Medicine

## 2023-02-07 NOTE — Telephone Encounter (Signed)
Patient called to reschedule appointment,

## 2023-02-21 ENCOUNTER — Inpatient Hospital Stay: Payer: Commercial Managed Care - PPO | Admitting: Adult Health

## 2023-02-22 ENCOUNTER — Encounter: Payer: Self-pay | Admitting: Adult Health

## 2023-02-22 ENCOUNTER — Inpatient Hospital Stay: Payer: Commercial Managed Care - PPO | Attending: Adult Health | Admitting: Adult Health

## 2023-02-22 VITALS — BP 100/57 | HR 79 | Temp 97.4°F | Resp 18 | Ht 65.0 in | Wt 202.6 lb

## 2023-02-22 DIAGNOSIS — Z79810 Long term (current) use of selective estrogen receptor modulators (SERMs): Secondary | ICD-10-CM | POA: Insufficient documentation

## 2023-02-22 DIAGNOSIS — Z170 Estrogen receptor positive status [ER+]: Secondary | ICD-10-CM

## 2023-02-22 DIAGNOSIS — C50211 Malignant neoplasm of upper-inner quadrant of right female breast: Secondary | ICD-10-CM | POA: Diagnosis not present

## 2023-02-22 DIAGNOSIS — Z801 Family history of malignant neoplasm of trachea, bronchus and lung: Secondary | ICD-10-CM | POA: Diagnosis not present
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
## 2023-02-22 NOTE — Assessment & Plan Note (Signed)
Amy Pittman is a 37 year old woman with stage IIa right-sided ER/PR positive breast cancer diagnosed in June 2022.  She is status post neoadjuvant chemotherapy followed by lumpectomy, adjuvant radiation, and antiestrogen therapy with tamoxifen which she began in January 2023.  Treatment plan: Neoadjuvant chemotherapy Breast conserving surgery with sentinel node biopsy Adjuvant radiation therapy Antiestrogen therapy with tamoxifen beginning January 2023  Tamoxifen Side Effects:  Hot flashes: She is managing these on her own, declines Effexor or gabapentin at this time Desire to child bear: Plans to undergo next mammogram and will discuss this with Dr. Lindi Adie at her next visit  Next Steps:  She has no clinical signs of breast cancer today Continue Tamoxifen daily Continue healthy diet and exercise Continue f/u with PCP and GYN Bilateral breast 3d diagnostic mammogram ordered for 05/2023 F/u with Dr. Lindi Adie in 08/2023 F/u with [facility] 02/2024

## 2023-03-29 ENCOUNTER — Other Ambulatory Visit (HOSPITAL_COMMUNITY): Payer: Self-pay

## 2023-04-25 ENCOUNTER — Ambulatory Visit: Payer: Commercial Managed Care - PPO | Attending: General Surgery

## 2023-04-25 VITALS — Wt 198.1 lb

## 2023-04-25 DIAGNOSIS — Z483 Aftercare following surgery for neoplasm: Secondary | ICD-10-CM

## 2023-04-25 NOTE — Therapy (Signed)
OUTPATIENT PHYSICAL THERAPY SOZO SCREENING NOTE   Patient Name: Amy Pittman MRN: [id number] DOB:[birth year], 37 y.o., female Today's Date: 04/25/2023  PCP: Myrlene Broker, MD REFERRING PROVIDER: Griselda Miner, MD   PT End of Session - 04/25/23 1557     Visit Number 2   # unchanged due to screen only   PT Start Time 1554    PT Stop Time 1559    PT Time Calculation (min) 5 min    Activity Tolerance Patient tolerated treatment well    Behavior During Therapy [facility] for tasks assessed/performed             Past Medical History:  Diagnosis Date   Breast cancer (HCC)    Cancer (HCC)    Generalized headaches    Generalized seizures [facility]) neurologist--  per pt followed by [place] neurologist   per pt dx 2010 and per pt none since ,did have work-up negative , felt she was going to sleep Abscent seizure  and triggers are stress and exhaustion   Menorrhagia    Personal history of chemotherapy    Personal history of radiation therapy    Wears glasses    Past Surgical History:  Procedure Laterality Date   ANKLE ARTHROSCOPY Right 07/09/2005     dr Turner Daniels  [facility]   w/ Debridement and removal loose body   BIOPSY  12/01/2020   Procedure: BIOPSY;  Surgeon: Lynann Bologna, MD;  Location: [facility] ENDOSCOPY;  Service: Endoscopy;;   BREAST LUMPECTOMY Right 10/16/2021   BREAST LUMPECTOMY WITH RADIOACTIVE SEED AND SENTINEL LYMPH NODE BIOPSY Right 10/16/2021   Procedure: RIGHT BREAST LUMPECTOMY WITH RADIOACTIVE SEED X2 AND SENTINEL LYMPH NODE BIOPSY;  Surgeon: Griselda Miner, MD;  Location: [facility];  Service: General;  Laterality: Right;   CHOLECYSTECTOMY N/A 12/02/2020   Procedure: LAPAROSCOPIC CHOLECYSTECTOMY WITH CHOLANGIOGRAM;  Surgeon: Harriette Bouillon, MD;  Location: [facility] OR;  Service: General;  Laterality: N/A;   DILATATION & CURETTAGE/HYSTEROSCOPY WITH MYOSURE N/A 11/24/2017   Procedure: DILATATION & CURETTAGE/HYSTEROSCOPY;  Surgeon: Carrington Clamp, MD;   Location: [facility];  Service: Gynecology;  Laterality: N/A;   ERCP N/A 12/01/2020   Procedure: ENDOSCOPIC RETROGRADE CHOLANGIOPANCREATOGRAPHY (ERCP);  Surgeon: Lynann Bologna, MD;  Location: [facility] ENDOSCOPY;  Service: Endoscopy;  Laterality: N/A;   IR IMAGING GUIDED PORT INSERTION  06/25/2021   IR REMOVAL TUN ACCESS W/ PORT W/O FL MOD SED  01/08/2022   REMOVAL OF STONES  12/01/2020   Procedure: REMOVAL OF STONES;  Surgeon: Lynann Bologna, MD;  Location: [facility] ENDOSCOPY;  Service: Endoscopy;;   SPHINCTEROTOMY  12/01/2020   Procedure: Dennison Mascot;  Surgeon: Lynann Bologna, MD;  Location: [facility] ENDOSCOPY;  Service: Endoscopy;;   Patient Active Problem List   Diagnosis Date Noted   Genetic testing 08/06/2021   Malignant neoplasm of upper-inner quadrant of right breast in female, estrogen receptor positive (HCC) 06/02/2021   RUQ abdominal pain    Gallstones    Choledocholithiasis    Pancreatitis 11/29/2020   Cellulitis of left upper eyelid 10/11/2018   Low back pain 05/28/2015   Awareness alteration, transient 03/07/2015   Routine health maintenance 05/10/2012   Generalized seizures (HCC)    Irregular menses     REFERRING DIAG: right breast cancer at risk for lymphedema  THERAPY DIAG: Aftercare following surgery for neoplasm  PERTINENT HISTORY: Patient reports she was diagnosed on 05/27/2021 with right grade III invasive ductal carcinoma breast cancer. She underwent neoadjuvant chemotherapy 06/26/2021 - 08/27/2021. Then she had a  right lumpectomy and sentinel node biopsy (4 negative nodes) on 10/16/2021. It is ER/PR positive and HER2 negative with a Ki67 of 60%. She has a hx of a seizure disorder but no seizures since 2010  PRECAUTIONS: right UE Lymphedema risk, None  SUBJECTIVE: Pt returns for her 3 month L-Dex screen.   PAIN:  Are you having pain? No  SOZO SCREENING: Patient was assessed today using the SOZO machine to determine the lymphedema index score. This was compared  to her baseline score. It was determined that she is within the recommended range when compared to her baseline and no further action is needed at this time. She will continue SOZO screenings. These are done every 3 months for 2 years post operatively followed by every 6 months for 2 years, and then annually.   L-DEX FLOWSHEETS - 04/25/23 1500       L-DEX LYMPHEDEMA SCREENING   Measurement Type Unilateral    L-DEX MEASUREMENT EXTREMITY Upper Extremity    POSITION  Standing    DOMINANT SIDE Right    At Risk Side Right    BASELINE SCORE (UNILATERAL) -2.4    L-DEX SCORE (UNILATERAL) -0.9    VALUE CHANGE (UNILAT) 1.5               Hermenia Bers, PTA 04/25/2023, 3:58 PM   -2.3

## 2023-05-27 ENCOUNTER — Ambulatory Visit
Admission: RE | Admit: 2023-05-27 | Discharge: 2023-05-27 | Disposition: A | Discharge status: Home / Self Care | Payer: Commercial Managed Care - PPO | Source: Ambulatory Visit | Attending: Adult Health | Admitting: Adult Health

## 2023-05-27 DIAGNOSIS — R921 Mammographic calcification found on diagnostic imaging of breast: Secondary | ICD-10-CM | POA: Diagnosis not present

## 2023-05-27 DIAGNOSIS — Z170 Estrogen receptor positive status [ER+]: Secondary | ICD-10-CM

## 2023-05-27 IMAGING — US US BREAST*R* LIMITED INC AXILLA
1 series · 13 of 13 positions shown · non-contrast
Comparison: Previous exam(s).

CLINICAL DATA: Palpable lump in the right breast.

EXAM:
DIGITAL DIAGNOSTIC BILATERAL MAMMOGRAM WITH TOMOSYNTHESIS AND CAD;
ULTRASOUND RIGHT BREAST LIMITED
TECHNIQUE: Bilateral digital diagnostic mammography and breast tomosynthesis
was performed. The images were evaluated with computer-aided
detection.; Targeted ultrasound examination of the right breast was
performed

[Series 1: us breast*right* limited inc axilla · 0.09mm/px · 13 acquisitions, 13 frames shown]
[im 1/13]
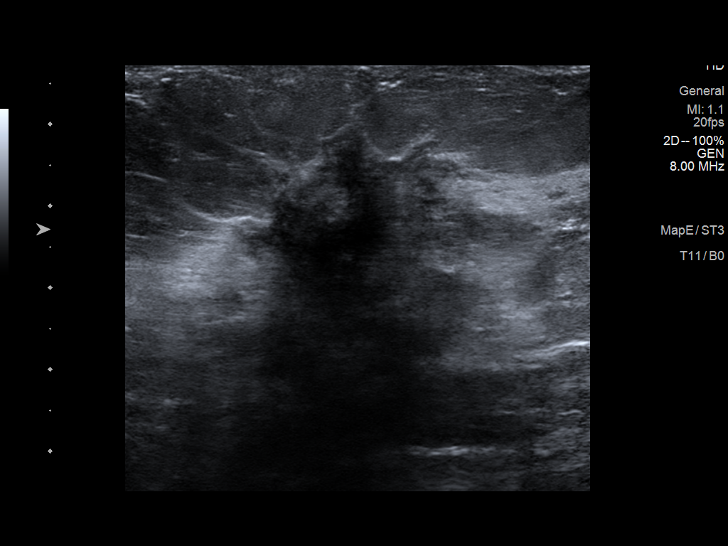
[im 2/13]
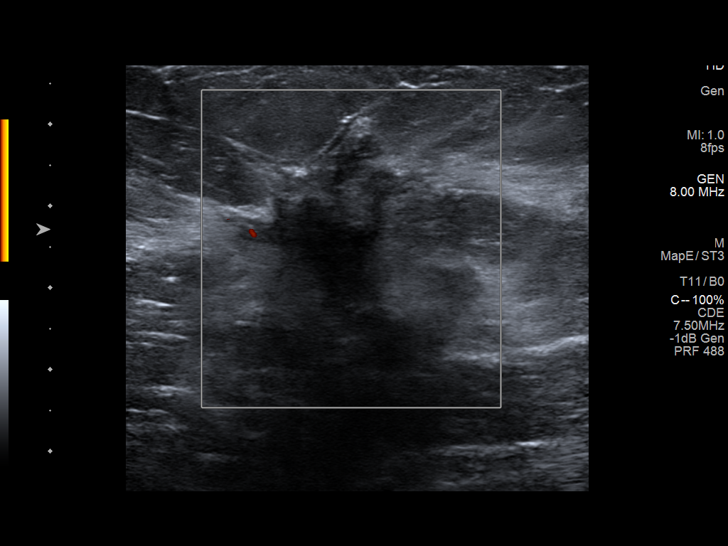
[im 3/13]
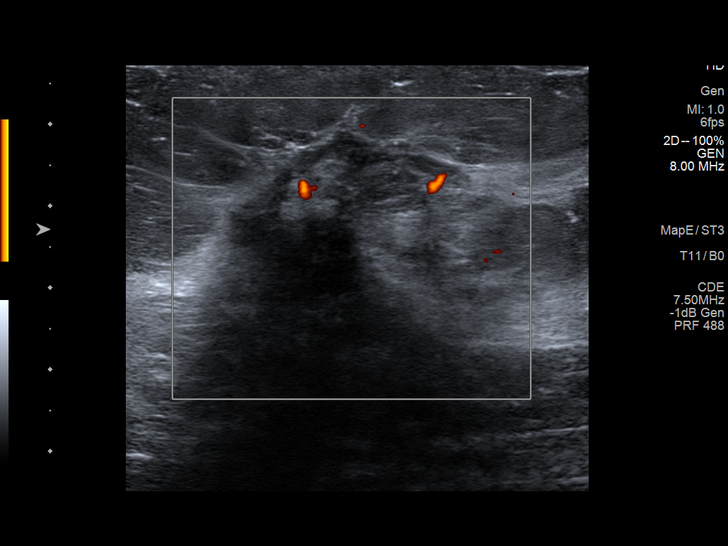
[im 4/13]
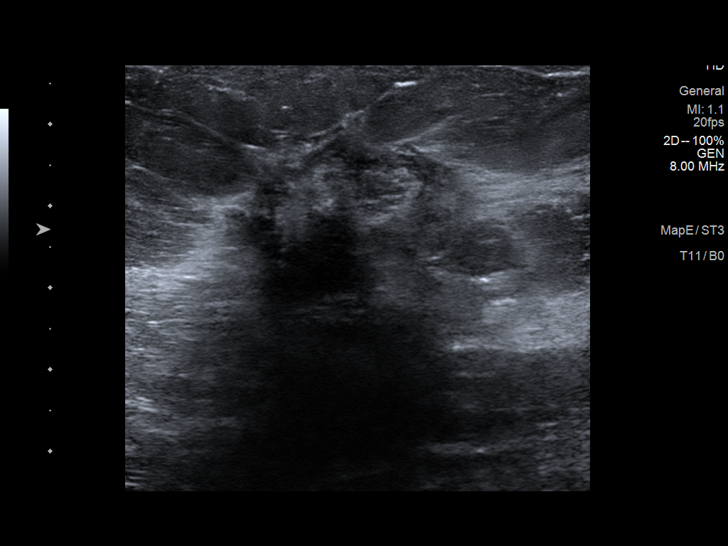
[im 5/13]
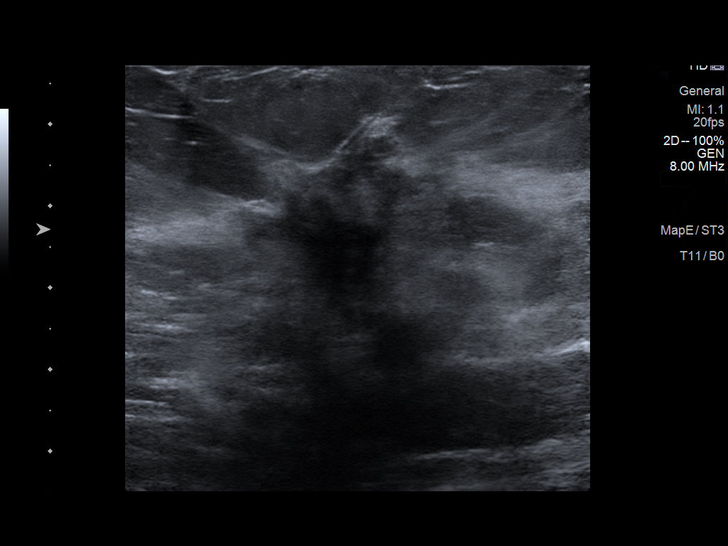
[im 6/13]
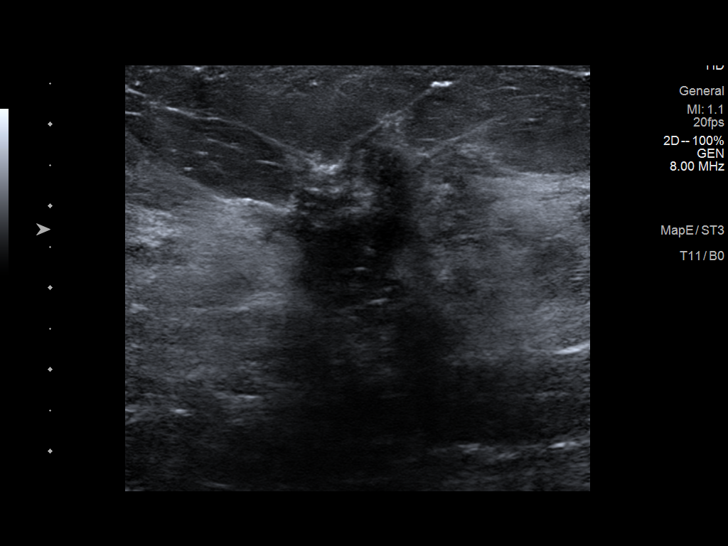
[im 7/13]
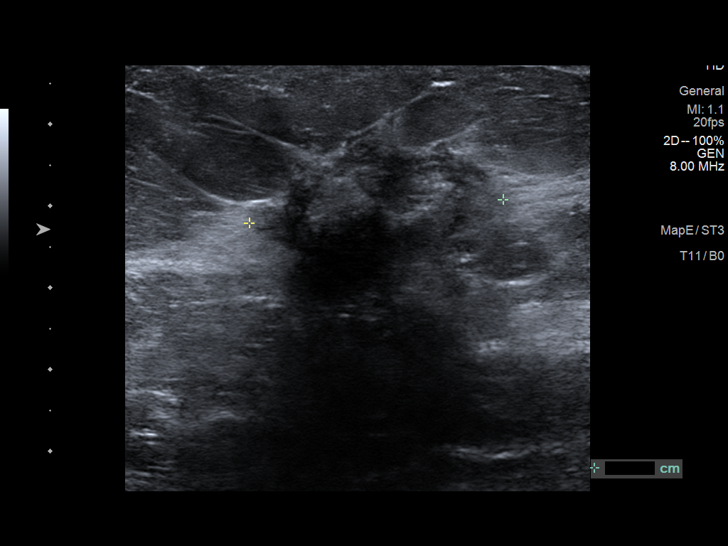
[im 8/13]
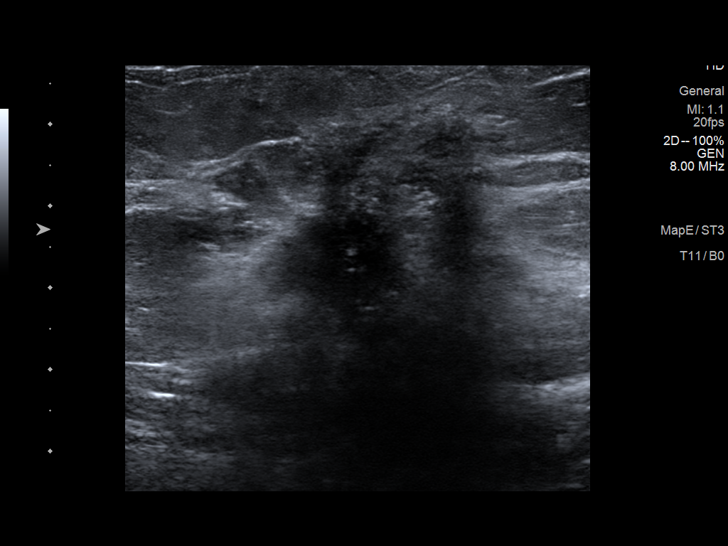
[im 9/13]
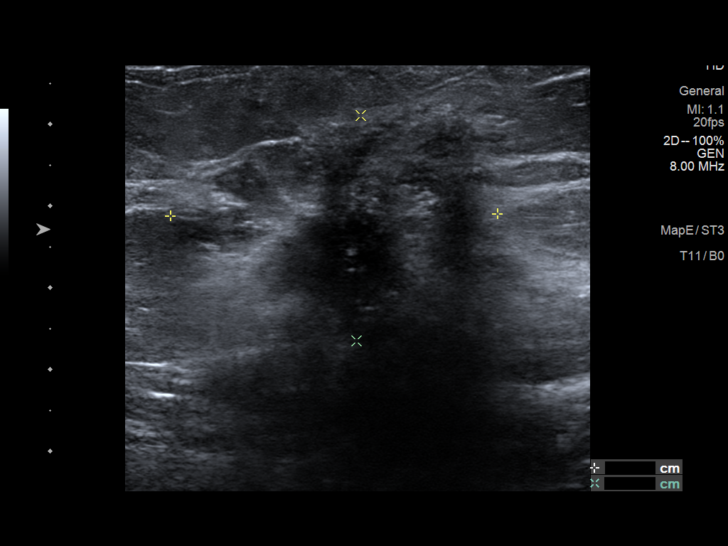
[im 10/13]
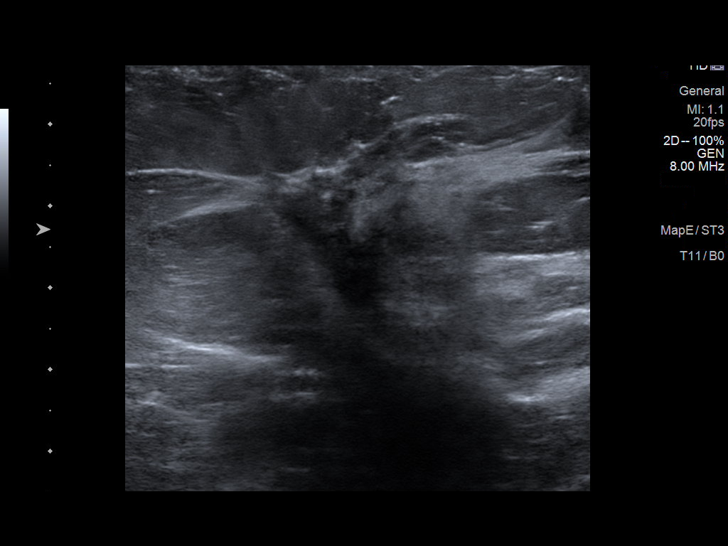
[im 11/13]
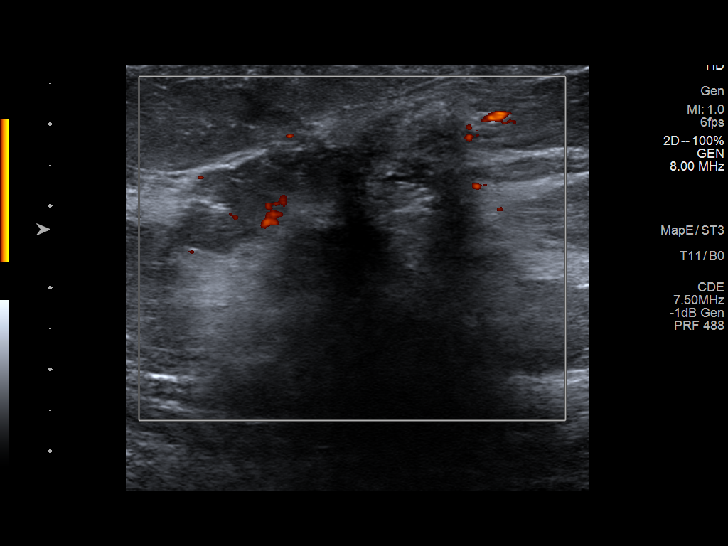
[im 12/13]
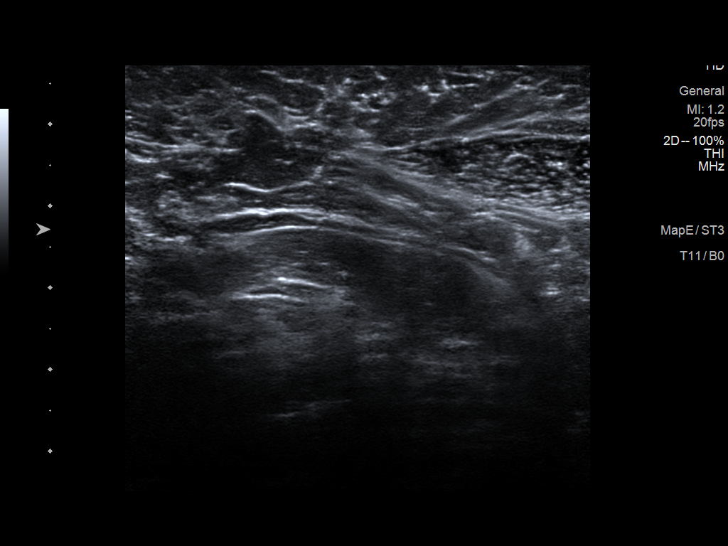
[im 13/13]
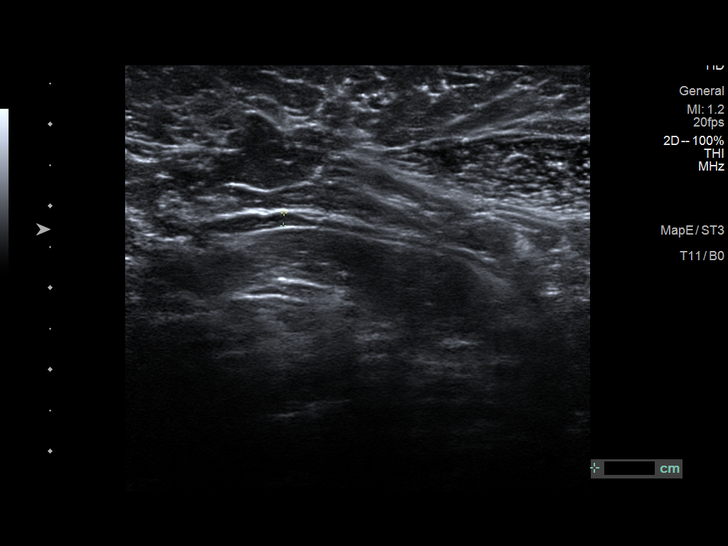

[13 of 13 positions shown; findings below may reference images not displayed]

ACR Breast Density Category c: The breast tissue is heterogeneously
dense, which may obscure small masses.
FINDINGS: There is a spiculated mass in the superior central right breast
containing suspicious calcifications. On spot views, the
calcifications extend lateral to the mass by at least 7 mm and up to
1.6 cm. No other suspicious findings in either breast.

On physical exam, there is a palpable lump at [DATE] in the right
breast.

Targeted ultrasound is performed, showing an irregular spiculated
mass in the right breast at [DATE], 7 cm from the nipple measuring
3.1 x 4.0 x 2.8 cm. No axillary adenopathy.
IMPRESSION: There is a highly suspicious mass in the superior central right
breast seen at [DATE] on ultrasound measuring up to 4 cm. On
mammography, there are associated calcifications consistent with
associated DCIS. On spot views, these calcifications extend lateral
to the mass by at least 7 mm and up to 1.6 cm. No evidence of
malignancy on the left.

RECOMMENDATION:
Recommend ultrasound-guided biopsy of the right breast mass. The
biopsy should demonstrate malignancy. Assuming malignancy at biopsy,
recommend breast MRI to evaluate extent. There does appear to be
DCIS which extends outside of the mass.

I have discussed the findings and recommendations with the patient.
If applicable, a reminder letter will be sent to the patient
regarding the next appointment.

BI-RADS CATEGORY  5: Highly suggestive of malignancy.

## 2023-05-31 ENCOUNTER — Other Ambulatory Visit: Payer: Self-pay | Admitting: Adult Health

## 2023-05-31 DIAGNOSIS — R921 Mammographic calcification found on diagnostic imaging of breast: Secondary | ICD-10-CM

## 2023-06-10 ENCOUNTER — Ambulatory Visit
Admission: RE | Admit: 2023-06-10 | Discharge: 2023-06-10 | Disposition: A | Discharge status: Home / Self Care | Payer: Commercial Managed Care - PPO | Source: Ambulatory Visit | Attending: Adult Health | Admitting: Adult Health

## 2023-06-10 DIAGNOSIS — R921 Mammographic calcification found on diagnostic imaging of breast: Secondary | ICD-10-CM

## 2023-06-10 HISTORY — PX: BREAST BIOPSY: SHX20

## 2023-06-13 ENCOUNTER — Ambulatory Visit: Payer: Commercial Managed Care - PPO | Admitting: Hematology and Oncology

## 2023-06-13 ENCOUNTER — Telehealth: Payer: Self-pay

## 2023-06-13 NOTE — Telephone Encounter (Signed)
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 She is currently scheduled to see Dr Carolynne Edouard 06/30/23. Appt made for pt to see Dr Pamelia Hoit Friday, July 12 at 0915 pending prognostic panel results.

## 2023-06-16 NOTE — Progress Notes (Signed)
Patient Care Team: Myrlene Broker, MD as PCP - General (Internal Medicine) Gean Birchwood, MD (Orthopedic Surgery) Serena Croissant, MD as Consulting Physician (Hematology and Oncology) Lonie Peak, MD as Attending Physician (Radiation Oncology) Griselda Miner, MD as Consulting Physician (General Surgery)  DIAGNOSIS:  Encounter Diagnosis  Name Primary?   Malignant neoplasm of upper-inner quadrant of right breast in female, estrogen receptor positive (HCC) Yes    SUMMARY OF ONCOLOGIC HISTORY: Oncology History  Malignant neoplasm of upper-inner quadrant of right breast in female, estrogen receptor positive (HCC)  04/30/2021 Genetic Testing   Negative. Variant of uncertain significance in TP53 (c.704A>G). Genes Tested include: BRCA1, BRCA2; APC, ATM, AXIN2, BAP1, BARD1, BMPR1A, BRIP1, CDH1, CDK4, CDKN2A, CHEK2, CTNNA1, FH, FLCN, HOXB13 (seq only), MEN1, MET, MLH1, MSH2, MSH3 (exlcuding repetitive portions of exon 1), MSH6, MUTYH, NTHL1, PALB2, PMS2, PTEN, RAD51C, RAD51D, SDHA, SDHB, SDHC, SDHD, SMAD4, STK11, TP53, TSC1, TSC2, VHL.   05/27/2021 Initial Diagnosis   Right breast biopsy 05/27/2021: Grade 3 IDC with DCIS ER 35%, PR 80%, Ki-67 60%, HER2 1+ negative,T2 N0 stage IIA Oncotype score: 30: High risk   06/02/2021 Cancer Staging   Staging form: Breast, AJCC 8th Edition - Clinical: Stage IIA (cT2, cN0, cM0, G3, ER+, PR+, HER2-) - Signed by Serena Croissant, MD on 06/02/2021 Stage prefix: Initial diagnosis Histologic grading system: 3 grade system   06/02/2021 - 06/16/2021 Anti-estrogen oral therapy   Neoadj tamoxifen   06/09/2021 Oncotype testing   Oncotype DX was obtained on the final surgical sample and the recurrence score of 30 predicts a risk of recurrence outside the breast over the next 9 years of 19%, if the patient's only systemic therapy is an antiestrogen for 5 years.  It also predicts a significant benefit from chemotherapy.   06/26/2021 - 08/27/2021 Chemotherapy    Patient  is on Treatment Plan: BREAST TC Q21D x4 cycles       10/16/2021 Definitive Surgery   FINAL MICROSCOPIC DIAGNOSIS:   A. LYMPH NODE, RIGHT AXILLARY, SENTINEL, EXCISION:  - 1 lymph node, negative for carcinoma (0/1).   B. LYMPH NODE, RIGHT AXILLARY, SENTINEL, EXCISION:  - 1 lymph node, negative for carcinoma (0/1).   C. LYMPH NODE, RIGHT AXILLARY, SENTINEL, EXCISION:  - 1 lymph node, negative for carcinoma (0/1).   D. LYMPH NODE, RIGHT AXILLARY, SENTINEL, EXCISION:  - 1 lymph node, negative for carcinoma (0/1).   E. BREAST, RIGHT, LUMPECTOMY:  - Residual invasive ductal carcinoma, 0.4 cm in maximal extent, grade 2.  - Invasive tumor comes to 0.5 cm of closest (lateral) resection margin.  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  - In situ carcinoma comes to 0.1 cm of closest (deep) margin.  - Biopsy site changes.    ADDENDUM:  PROGNOSTIC INDICATOR RESULTS:   By immunohistochemistry, HER-2 is EQUIVOCAL (2+).  HER-2 by FISH is  pending and will be reported in an addendum.   Estrogen Receptor: POSITIVE, 40% WEAK STAINING INTENSITY  Progesterone Receptor: POSITIVE, 20% MODERATE STAINING INTENSITY  Proliferation Marker Ki-67: <1%   ADDENDUM:  FLOURESCENCE IN-SITU HYBRIDIZATION RESULTS:  GROUP 5:   HER2 **NEGATIVE**    11/17/2021 - 12/15/2021 Radiation Therapy   Site Technique Total Dose (Gy) Dose per Fx (Gy) Completed Fx Beam Energies  Breast, Right: Breast_R 3D 40.05/40.05 2.67 15/15 10X, 6XFFF  Breast, Right: Breast_R_Bst 3D 10/10 2 5/5 6X     12/18/2021 -  Anti-estrogen oral therapy   Adjuvant tamoxifen     CHIEF COMPLIANT: Follow-up  recurrence breast cancer  INTERVAL HISTORY: Amy Pittman is a 37 year old above-mentioned history of right breast cancer treated with neoadjuvant chemotherapy followed by lumpectomy. She presents to the clinic today for a follow-up .     ALLERGIES:  has No Known Allergies.  MEDICATIONS:  Current  Outpatient Medications  Medication Sig Dispense Refill   levonorgestrel (MIRENA, 52 MG,) 20 MCG/24HR IUD 1 Intra Uterine Device by Intrauterine route once. Every 7 years     tamoxifen (NOLVADEX) 20 MG tablet Take 1 tablet (20 mg total) by mouth daily. (Patient not taking: Reported on 06/17/2023) 90 tablet 3   No current facility-administered medications for this visit.    PHYSICAL EXAMINATION: ECOG PERFORMANCE STATUS: 1 - Symptomatic but completely ambulatory  Vitals:   06/17/23 0915  BP: 122/77  Pulse: 75  Resp: 16  Temp: 97.7 F (36.5 C)  SpO2: 95%   Filed Weights   06/17/23 0915  Weight: 202 lb 6.4 oz (91.8 kg)      LABORATORY DATA:  I have reviewed the data as listed    Latest Ref Rng & Units 08/27/2021    8:29 AM 08/06/2021    9:55 AM 07/16/2021    9:56 AM  CMP  Glucose 70 - 99 mg/dL 098  91  97   BUN 6 - 20 mg/dL 7  11  14    Creatinine 0.44 - 1.00 mg/dL 1.19  1.47  8.29   Sodium 135 - 145 mmol/L 143  143  141   Potassium 3.5 - 5.1 mmol/L 3.7  3.4  3.5   Chloride 98 - 111 mmol/L 110  108  106   CO2 22 - 32 mmol/L 21  23  24    Calcium 8.9 - 10.3 mg/dL 9.7  9.4  9.1   Total Protein 6.5 - 8.1 g/dL 7.3  7.0  7.2   Total Bilirubin 0.3 - 1.2 mg/dL 0.3  0.4  0.4   Alkaline Phos 38 - 126 U/L 64  54  50   AST 15 - 41 U/L 12  15  14    ALT 0 - 44 U/L 15  18  22      Lab Results  Component Value Date   WBC 9.5 08/27/2021   HGB 11.2 (L) 08/27/2021   HCT 33.5 (L) 08/27/2021   MCV 90.3 08/27/2021   PLT 332 08/27/2021   NEUTROABS 7.8 (H) 08/27/2021    ASSESSMENT & PLAN:  Malignant neoplasm of upper-inner quadrant of right breast in female, estrogen receptor positive (HCC) 05/27/2021:Right breast biopsy 05/27/2021: 4 cm of grade 3 IDC with DCIS ER 35%, PR 80%, Ki-67 60%, HER2 1+ negative,T2 N0 stage IIA Oncotype score: 30: High risk Neoadjuvant chemo with Taxotere and Cytoxan x4 cycles completed 08/27/2021    10/16/2021:Right lumpectomy: Residual grade 2 IDC 0.4 cm,  residual DCIS high-grade, margins negative, 0/4 lymph nodes negative, ER 40% weak staining, PR 20% moderate staining, Ki-67 less than 1%, HER2 equal vocal 2+ by IHC, FISH negative, RCB class I   Treatment plan: 1. Adjuvant radiation completed 12/15/21 2. we stopped Zoladex injections because the patient is contemplating on having children 3.  Current treatment: Tamoxifen 20 mg daily  Breast cancer surveillance: 1.  Breast exam 06/17/2023: Benign 2. Mammogram 05/27/2023: Indeterminate calcifications bilateral breasts, right breast: 1.8 cm Right breast biopsy: Grade 2 IDC with DCIS: ER 0%, PR 90%, Ki-67 15%, HER2 0-1+ negative Left breast biopsy: Benign  Treatment plan: Right mastectomy with reconstruction versus another lumpectomy Adjuvant Xeloda for  6 months versus adjuvant chemotherapy with Adriamycin and Cytoxan followed by Taxol Discontinue tamoxifen at this time We will get her into see plastic surgery and I will request Dr. Carolynne Edouard to see her sooner. Return to clinic after surgery to discuss final pathology report. CT CAP and bone scan will be ordered  Orders Placed This Encounter  Procedures   CT CHEST ABDOMEN PELVIS W CONTRAST    Without oral contrast    Standing Status:   Future    Standing Expiration Date:   06/16/2024    Order Specific Question:   If indicated for the ordered procedure, I authorize the administration of contrast media per Radiology protocol    Answer:   Yes    Order Specific Question:   Does the patient have a contrast media/X-ray dye allergy?    Answer:   No    Order Specific Question:   Is patient pregnant?    Answer:   No    Order Specific Question:   Preferred imaging location?    Answer:   [facility]    Order Specific Question:   If indicated for the ordered procedure, I authorize the administration of oral contrast media per Radiology protocol    Answer:   Yes   NM Bone Scan Whole Body    Standing Status:   Future    Standing Expiration Date:    06/16/2024    Order Specific Question:   If indicated for the ordered procedure, I authorize the administration of a radiopharmaceutical per Radiology protocol    Answer:   Yes    Order Specific Question:   Is the patient pregnant?    Answer:   No    Order Specific Question:   Preferred imaging location?    Answer:   [facility]   The patient has a good understanding of the overall plan. she agrees with it. she will call with any problems that may develop before the next visit here. Total time spent: 30 mins including face to face time and time spent for planning, charting and co-ordination of care   Tamsen Meek, MD 06/17/23   I Janan Ridge am acting as a Neurosurgeon for [employer]  I have reviewed the above documentation for accuracy and completeness, and I agree with the above.

## 2023-06-17 ENCOUNTER — Inpatient Hospital Stay: Payer: Commercial Managed Care - PPO | Attending: Hematology and Oncology | Admitting: Hematology and Oncology

## 2023-06-17 ENCOUNTER — Other Ambulatory Visit: Payer: Self-pay

## 2023-06-17 VITALS — BP 122/77 | HR 75 | Temp 97.7°F | Resp 16 | Wt 202.4 lb

## 2023-06-17 DIAGNOSIS — Z79810 Long term (current) use of selective estrogen receptor modulators (SERMs): Secondary | ICD-10-CM | POA: Insufficient documentation

## 2023-06-17 DIAGNOSIS — Z9011 Acquired absence of right breast and nipple: Secondary | ICD-10-CM | POA: Insufficient documentation

## 2023-06-17 DIAGNOSIS — C50211 Malignant neoplasm of upper-inner quadrant of right female breast: Secondary | ICD-10-CM | POA: Insufficient documentation

## 2023-06-17 DIAGNOSIS — Z923 Personal history of irradiation: Secondary | ICD-10-CM | POA: Insufficient documentation

## 2023-06-17 DIAGNOSIS — Z170 Estrogen receptor positive status [ER+]: Secondary | ICD-10-CM | POA: Insufficient documentation

## 2023-06-17 NOTE — Assessment & Plan Note (Addendum)
05/27/2021:Right breast biopsy 05/27/2021: 4 cm of grade 3 IDC with DCIS ER 35%, PR 80%, Ki-67 60%, HER2 1+ negative,T2 N0 stage IIA Oncotype score: 30: High risk Neoadjuvant chemo with Taxotere and Cytoxan x4 cycles completed 08/27/2021    10/16/2021:Right lumpectomy: Residual grade 2 IDC 0.4 cm, residual DCIS high-grade, margins negative, 0/4 lymph nodes negative, ER 40% weak staining, PR 20% moderate staining, Ki-67 less than 1%, HER2 equal vocal 2+ by IHC, FISH negative, RCB class I   Treatment plan: 1. Adjuvant radiation completed 12/15/21 2. we stopped Zoladex injections because the patient is contemplating on having children 3.  Current treatment: Tamoxifen 20 mg daily  Breast cancer surveillance: 1.  Breast exam 06/17/2023: Benign 2. Mammogram 05/27/2023: Indeterminate calcifications bilateral breasts, right breast: 1.8 cm Right breast biopsy: Grade 2 IDC with DCIS: ER 0%, PR 90%, Ki-67 15%, HER2 0-1+ negative Left breast biopsy: Benign  Treatment plan: Right lumpectomy Adjuvant Xeloda for 6 months versus adjuvant chemotherapy with Adriamycin and Cytoxan followed by Taxol Continue with adjuvant tamoxifen (benefits are very small given the fact that the estrogen receptor even initially was extremely weak and on the recurrence was 0%)

## 2023-06-20 ENCOUNTER — Encounter: Payer: Self-pay | Admitting: *Deleted

## 2023-06-20 ENCOUNTER — Telehealth: Payer: Self-pay | Admitting: Hematology and Oncology

## 2023-06-20 NOTE — Telephone Encounter (Signed)
Scheduled appointment per 7/12 los. Left voicemail.

## 2023-06-21 DIAGNOSIS — Z01419 Encounter for gynecological examination (general) (routine) without abnormal findings: Secondary | ICD-10-CM | POA: Diagnosis not present

## 2023-06-27 ENCOUNTER — Encounter (HOSPITAL_COMMUNITY): Payer: Commercial Managed Care - PPO

## 2023-06-27 ENCOUNTER — Encounter (HOSPITAL_COMMUNITY)
Admission: RE | Admit: 2023-06-27 | Discharge: 2023-06-27 | Disposition: A | Discharge status: Home / Self Care | Payer: Commercial Managed Care - PPO | Source: Ambulatory Visit | Attending: Hematology and Oncology | Admitting: Hematology and Oncology

## 2023-06-27 DIAGNOSIS — Z170 Estrogen receptor positive status [ER+]: Secondary | ICD-10-CM | POA: Insufficient documentation

## 2023-06-27 DIAGNOSIS — C50211 Malignant neoplasm of upper-inner quadrant of right female breast: Secondary | ICD-10-CM | POA: Insufficient documentation

## 2023-06-27 DIAGNOSIS — C50919 Malignant neoplasm of unspecified site of unspecified female breast: Secondary | ICD-10-CM | POA: Diagnosis not present

## 2023-06-27 MED ORDER — TECHNETIUM TC 99M MEDRONATE IV KIT
20.0000 | PACK | Freq: Once | INTRAVENOUS | Status: AC
  Administered 2023-06-27: 21.5 via INTRAVENOUS

## 2023-06-28 ENCOUNTER — Ambulatory Visit: Payer: Commercial Managed Care - PPO | Admitting: Plastic Surgery

## 2023-06-28 ENCOUNTER — Encounter: Payer: Self-pay | Admitting: Plastic Surgery

## 2023-06-28 VITALS — BP 113/77 | HR 65 | Ht 65.0 in | Wt 200.0 lb

## 2023-06-28 DIAGNOSIS — Z1379 Encounter for other screening for genetic and chromosomal anomalies: Secondary | ICD-10-CM

## 2023-06-28 DIAGNOSIS — K851 Biliary acute pancreatitis without necrosis or infection: Secondary | ICD-10-CM

## 2023-06-28 DIAGNOSIS — C50211 Malignant neoplasm of upper-inner quadrant of right female breast: Secondary | ICD-10-CM | POA: Diagnosis not present

## 2023-06-28 DIAGNOSIS — Z170 Estrogen receptor positive status [ER+]: Secondary | ICD-10-CM | POA: Diagnosis not present

## 2023-06-28 DIAGNOSIS — Z923 Personal history of irradiation: Secondary | ICD-10-CM

## 2023-06-28 DIAGNOSIS — R569 Unspecified convulsions: Secondary | ICD-10-CM

## 2023-06-28 NOTE — Progress Notes (Signed)
Patient ID: Amy Pittman, female    DOB: 1986/07/18, 37 y.o.   MRN: [id number]   Chief Complaint  Patient presents with   Breast Cancer   Breast Problem    The patient is a 37 year old female here for breast reconstruction.  She is seeing Dr. Georgiann Mohs and Dr. Carolynne Edouard.  She has been diagnosed with a right-sided malignant neoplasia of the upper inner quadrant.  She was treated with a partial mastectomy and neoadjuvant chemotherapy for an estrogen receptor positive breast cancer Nov 2022.  She has been to physical therapy.  She was also treated with radiation that ended in January 2023. She now has an estrogen negative right breast cancer.  She is trying to decide between lumpectomy and right mastectomy.  She works at [employer].  The breasts are ptotic and the right is smaller then the left.  She is 5 feet 5 inches tall and weighs 200 pounds.     Review of Systems  Constitutional: Negative.   HENT: Negative.    Eyes: Negative.   Respiratory: Negative.  Negative for chest tightness and shortness of breath.   Cardiovascular: Negative.   Gastrointestinal: Negative.   Endocrine: Negative.   Genitourinary: Negative.   Musculoskeletal: Negative.   Skin:  Positive for color change.    Past Medical History:  Diagnosis Date   Breast cancer (HCC)    Cancer (HCC)    Generalized headaches    Generalized seizures [facility]) neurologist--  per pt followed by [facility] neurologist   per pt dx 2010 and per pt none since ,did have work-up negative , felt she was going to sleep Abscent seizure  and triggers are stress and exhaustion   Menorrhagia    Personal history of chemotherapy    Personal history of radiation therapy    Wears glasses     Past Surgical History:  Procedure Laterality Date   ANKLE ARTHROSCOPY Right 07/09/2005     dr Turner Daniels  [facility]   w/ Debridement and removal loose body   BIOPSY  12/01/2020   Procedure: BIOPSY;  Surgeon: Lynann Bologna, MD;  Location: [facility] ENDOSCOPY;  Service:  Endoscopy;;   BREAST BIOPSY Right 06/10/2023   MM RT BREAST BX W LOC DEV 1ST LESION IMAGE BX SPEC STEREO GUIDE 06/10/2023 [facility] MAMMOGRAPHY   BREAST BIOPSY Left 06/10/2023   MM LT BREAST BX W LOC DEV 1ST LESION IMAGE BX SPEC STEREO GUIDE 06/10/2023 [facility] M[facility]MO[facility]HY   BREAST LUMPECTOMY Right 10/16/2021   BREAST LUMPECTOMY WITH RADIOACTIVE SEED AND SENTINEL LYMPH NODE BIOPSY Right 10/16/2021   Procedure: RIGHT BREAST LUMPECTOMY WITH RADIOACTIVE SEED X2 AND SENTINEL LYMPH NODE BIOPSY;  Surgeon: Griselda Miner, MD;  Location: [facility];  Service: General;  Laterality: Right;   CHOLECYSTECTOMY N/A 12/02/2020   Procedure: LAPAROSCOPIC CHOLECYSTECTOMY WITH CHOLANGIOGRAM;  Surgeon: Harriette Bouillon, MD;  Location: [facility] OR;  Service: General;  Laterality: N/A;   DILATATION & CURETTAGE/HYSTEROSCOPY WITH MYOSURE N/A 11/24/2017   Procedure: DILATATION & CURETTAGE/HYSTEROSCOPY;  Surgeon: Carrington Clamp, MD;  Location: [facility];  Service: Gynecology;  Laterality: N/A;   ERCP N/A 12/01/2020   Procedure: ENDOSCOPIC RETROGRADE CHOLANGIOPANCREATOGRAPHY (ERCP);  Surgeon: Lynann Bologna, MD;  Location: [facility] ENDOSCOPY;  Service: Endoscopy;  Laterality: N/A;   IR IMAGING GUIDED PORT INSERTION  06/25/2021   IR REMOVAL TUN ACCESS W/ PORT W/O FL MOD SED  01/08/2022   REMOVAL OF STONES  12/01/2020   Procedure: REMOVAL OF STONES;  Surgeon: Lynann Bologna, MD;  Location: [facility] ENDOSCOPY;  Service: Endoscopy;;   SPHINCTEROTOMY  12/01/2020   Procedure: SPHINCTEROTOMY;  Surgeon: Lynann Bologna, MD;  Location: [facility] ENDOSCOPY;  Service: Endoscopy;;      Current Outpatient Medications:    levonorgestrel (MIRENA, 52 MG,) 20 MCG/24HR IUD, 1 Intra Uterine Device by Intrauterine route once. Every 7 years, Disp: , Rfl:    tamoxifen (NOLVADEX) 20 MG tablet, Take 1 tablet (20 mg total) by mouth daily., Disp: 90 tablet, Rfl: 3   Objective:   Vitals:   06/28/23 1125  BP: 113/77  Pulse: 65  SpO2: 99%     Physical Exam Vitals and nursing note reviewed.  Constitutional:      Appearance: Normal appearance.  HENT:     Head: Normocephalic and atraumatic.  Cardiovascular:     Rate and Rhythm: Normal rate.     Pulses: Normal pulses.  Pulmonary:     Effort: Pulmonary effort is normal.  Abdominal:     Palpations: Abdomen is soft.  Musculoskeletal:        General: Swelling and tenderness present.  Skin:    General: Skin is warm.     Capillary Refill: Capillary refill takes less than 2 seconds.     Coloration: Skin is not jaundiced or pale.     Findings: Bruising present. No lesion.  Neurological:     Mental Status: She is alert and oriented to person, place, and time.  Psychiatric:        Mood and Affect: Mood normal.        Behavior: Behavior normal.        Thought Content: Thought content normal.        Judgment: Judgment normal.     Assessment & Plan:  Genetic testing  Malignant neoplasm of upper-inner quadrant of right breast in female, estrogen receptor positive (HCC)  Generalized seizures (HCC)  Acute biliary pancreatitis, unspecified complication status  The patient is trying to decide whether she would like a right lumpectomy or right mastectomy.  What ever she decides the right side the left side can undergo a breast lift and reduction for symmetry.  If she has a mastectomy her options include implant-based reconstruction and a Diep flap.  She is leaning towards implant-based reconstruction. She would like to talk more after she talks with Dr. Carolynne Edouard. Certainly a flat closure is option and she was given the information for [facility].  Implant-based reconstruction would be states that she would have to take that into consideration as far as work goes.  Pictures were obtained of the patient and placed in the chart with the patient's or guardian's permission.   Alena Bills Keyvon Herter, DO

## 2023-06-29 ENCOUNTER — Telehealth: Payer: Self-pay

## 2023-06-29 ENCOUNTER — Ambulatory Visit (HOSPITAL_COMMUNITY)
Admission: RE | Admit: 2023-06-29 | Discharge: 2023-06-29 | Disposition: A | Discharge status: Home / Self Care | Payer: Commercial Managed Care - PPO | Source: Ambulatory Visit | Attending: Hematology and Oncology | Admitting: Hematology and Oncology

## 2023-06-29 DIAGNOSIS — C50211 Malignant neoplasm of upper-inner quadrant of right female breast: Secondary | ICD-10-CM | POA: Insufficient documentation

## 2023-06-29 DIAGNOSIS — Z170 Estrogen receptor positive status [ER+]: Secondary | ICD-10-CM | POA: Diagnosis not present

## 2023-06-29 DIAGNOSIS — J9811 Atelectasis: Secondary | ICD-10-CM | POA: Diagnosis not present

## 2023-06-29 DIAGNOSIS — R160 Hepatomegaly, not elsewhere classified: Secondary | ICD-10-CM | POA: Diagnosis not present

## 2023-06-29 DIAGNOSIS — C50911 Malignant neoplasm of unspecified site of right female breast: Secondary | ICD-10-CM | POA: Diagnosis not present

## 2023-06-29 MED ORDER — IOHEXOL 300 MG/ML  SOLN
100.0000 mL | Freq: Once | INTRAMUSCULAR | Status: AC | PRN
  Administered 2023-06-29: 100 mL via INTRAVENOUS

## 2023-06-29 NOTE — Telephone Encounter (Signed)
Faxed OV note, demographics, and Rx to [facility] with confirmed receipt.

## 2023-06-30 ENCOUNTER — Ambulatory Visit: Payer: Self-pay | Admitting: General Surgery

## 2023-06-30 DIAGNOSIS — Z171 Estrogen receptor negative status [ER-]: Secondary | ICD-10-CM | POA: Diagnosis not present

## 2023-06-30 DIAGNOSIS — C50411 Malignant neoplasm of upper-outer quadrant of right female breast: Secondary | ICD-10-CM | POA: Diagnosis not present

## 2023-06-30 MED ORDER — KETOROLAC TROMETHAMINE 15 MG/ML IJ SOLN: 15.0000 mg | Freq: Once | INTRAMUSCULAR | Status: AC

## 2023-07-01 ENCOUNTER — Telehealth: Payer: Self-pay

## 2023-07-01 NOTE — Telephone Encounter (Signed)
Pt called for advice regarding mastectomy va lumpectomy. Pt is asking why Dr Pamelia Hoit is recommending mastectomy over lumpectomy. Explained to pt given her recurrence of brca and hx of radiation, she is likely to have another recurrence if she does not have a mastectomy. 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 Pt was encouraged this was her decision ultimately, but that Dr Earmon Phoenix recommendation from an oncology standpoint is that she moves forward with mastectomy. She verbalized thanks and understanding.

## 2023-07-03 NOTE — Progress Notes (Signed)
HEMATOLOGY-ONCOLOGY TELEPHONE VISIT PROGRESS NOTE  I connected with our patient on 07/04/23 at  1:30 PM EDT by telephone and verified that I am speaking with the correct person using two identifiers.  I discussed the limitations, risks, security and privacy concerns of performing an evaluation and management service by telephone and the availability of in person appointments.  I also discussed with the patient that there may be a patient responsible charge related to this service. The patient expressed understanding and agreed to proceed.   History of Present Illness: Amy Pittman is a 37 year old above-mentioned history of right breast cancer treated with neoadjuvant chemotherapy followed by lumpectomy who presented with a recurrence of breast cancer.  She underwent CT scans and bone scans and I connected with me by telephone call to discuss results.  She tells me that she has not completely sure about doing a mastectomy will think about whether she wants to proceed with lumpectomy instead.  She understands that there will be a higher local recurrence rate with the mastectomy.  Oncology History  Malignant neoplasm of upper-inner quadrant of right breast in female, estrogen receptor positive (HCC)  04/30/2021 Genetic Testing   Negative. Variant of uncertain significance in TP53 (c.704A>G). Genes Tested include: BRCA1, BRCA2; APC, ATM, AXIN2, BAP1, BARD1, BMPR1A, BRIP1, CDH1, CDK4, CDKN2A, CHEK2, CTNNA1, FH, FLCN, HOXB13 (seq only), MEN1, MET, MLH1, MSH2, MSH3 (exlcuding repetitive portions of exon 1), MSH6, MUTYH, NTHL1, PALB2, PMS2, PTEN, RAD51C, RAD51D, SDHA, SDHB, SDHC, SDHD, SMAD4, STK11, TP53, TSC1, TSC2, VHL.   05/27/2021 Initial Diagnosis   Right breast biopsy 05/27/2021: Grade 3 IDC with DCIS ER 35%, PR 80%, Ki-67 60%, HER2 1+ negative,T2 N0 stage IIA Oncotype score: 30: High risk   06/02/2021 Cancer Staging   Staging form: Breast, AJCC 8th Edition - Clinical: Stage IIA (cT2, cN0,  cM0, G3, ER+, PR+, HER2-) - Signed by Serena Croissant, MD on 06/02/2021 Stage prefix: Initial diagnosis Histologic grading system: 3 grade system   06/02/2021 - 06/16/2021 Anti-estrogen oral therapy   Neoadj tamoxifen   06/09/2021 Oncotype testing   Oncotype DX was obtained on the final surgical sample and the recurrence score of 30 predicts a risk of recurrence outside the breast over the next 9 years of 19%, if the patient's only systemic therapy is an antiestrogen for 5 years.  It also predicts a significant benefit from chemotherapy.   06/26/2021 - 08/27/2021 Chemotherapy    Patient is on Treatment Plan: BREAST TC Q21D x4 cycles       10/16/2021 Definitive Surgery   FINAL MICROSCOPIC DIAGNOSIS:   A. LYMPH NODE, RIGHT AXILLARY, SENTINEL, EXCISION:  - 1 lymph node, negative for carcinoma (0/1).   B. LYMPH NODE, RIGHT AXILLARY, SENTINEL, EXCISION:  - 1 lymph node, negative for carcinoma (0/1).   C. LYMPH NODE, RIGHT AXILLARY, SENTINEL, EXCISION:  - 1 lymph node, negative for carcinoma (0/1).   D. LYMPH NODE, RIGHT AXILLARY, SENTINEL, EXCISION:  - 1 lymph node, negative for carcinoma (0/1).   E. BREAST, RIGHT, LUMPECTOMY:  - Residual invasive ductal carcinoma, 0.4 cm in maximal extent, grade 2.  - Invasive tumor comes to 0.5 cm of closest (lateral) resection margin.  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  - In situ carcinoma comes to 0.1 cm of closest (deep) margin.  - Biopsy site changes.    ADDENDUM:  PROGNOSTIC INDICATOR RESULTS:   By immunohistochemistry, HER-2 is EQUIVOCAL (2+).  HER-2 by [facility] is  pending and will be reported in  an addendum.   Estrogen Receptor: POSITIVE, 40% WEAK STAINING INTENSITY  Progesterone Receptor: POSITIVE, 20% MODERATE STAINING INTENSITY  Proliferation Marker Ki-67: <1%   ADDENDUM:  FLOURESCENCE IN-SITU HYBRIDIZATION RESULTS:  GROUP 5:   HER2 **NEGATIVE**    11/17/2021 - 12/15/2021 Radiation Therapy   Site Technique  Total Dose (Gy) Dose per Fx (Gy) Completed Fx Beam Energies  Breast, Right: Breast_R 3D 40.05/40.05 2.67 15/15 10X, 6XFFF  Breast, Right: Breast_R_Bst 3D 10/10 2 5/5 6X     12/18/2021 -  Anti-estrogen oral therapy   Adjuvant tamoxifen     REVIEW OF SYSTEMS:   Constitutional: Denies fevers, chills or abnormal weight loss All other systems were reviewed with the patient and are negative. Observations/Objective:     Assessment Plan:  Malignant neoplasm of upper-inner quadrant of right breast in female, estrogen receptor positive (HCC) 05/27/2021:Right breast biopsy 05/27/2021: 4 cm of grade 3 IDC with DCIS ER 35%, PR 80%, Ki-67 60%, HER2 1+ negative,T2 N0 stage IIA Oncotype score: 30: High risk Neoadjuvant chemo with Taxotere and Cytoxan x4 cycles completed 08/27/2021    10/16/2021:Right lumpectomy: Residual grade 2 IDC 0.4 cm, residual DCIS high-grade, margins negative, 0/4 lymph nodes negative, ER 40% weak staining, PR 20% moderate staining, Ki-67 less than 1%, HER2 equal vocal 2+ by IHC, FISH negative, RCB class I   Treatment plan: 1. Adjuvant radiation completed 12/15/21 2. we stopped Zoladex injections because the patient is contemplating on having children 3.  Current treatment: Tamoxifen 20 mg daily   Breast cancer surveillance: 1.  Breast exam 06/17/2023: Benign 2. Mammogram 05/27/2023: Indeterminate calcifications bilateral breasts, right breast: 1.8 cm Right breast biopsy 06/10/2023: Grade 2 IDC with DCIS: ER 0%, PR 90%, Ki-67 15%, HER2 0-1+ negative Left breast biopsy: Benign   Treatment plan: Right mastectomy with reconstruction versus another lumpectomy (patient is really conflicted about the surgical plan.  She understands that if she does not do a mastectomy there is a high chance of local recurrence.  She has not made up her mind on this and will think about it and let us know. Adjuvant Xeloda for 6 months versus adjuvant chemotherapy with Adriamycin and Cytoxan followed by  Taxol (based upon the final tumor size) Discontinue tamoxifen at this time   CT CAP 06/30/2023: No evidence of metastatic disease hepatomegaly Bone scan 07/03/2023: No evidence of bone metastases  Return to clinic after surgery to discuss final pathology report  I discussed the assessment and treatment plan with the patient. The patient was provided an opportunity to ask questions and all were answered. The patient agreed with the plan and demonstrated an understanding of the instructions. The patient was advised to call back or seek an in-person evaluation if the symptoms worsen or if the condition fails to improve as anticipated.   I provided 12 minutes of non-face-to-face time during this encounter.  This includes time for charting and coordination of care   Tamsen Meek, MD  I Janan Ridge am acting as a scribe for Dr.Vinay Gudena  I have reviewed the above documentation for accuracy and completeness, and I agree with the above.

## 2023-07-04 ENCOUNTER — Inpatient Hospital Stay (HOSPITAL_BASED_OUTPATIENT_CLINIC_OR_DEPARTMENT_OTHER): Payer: Commercial Managed Care - PPO | Admitting: Hematology and Oncology

## 2023-07-04 DIAGNOSIS — C50211 Malignant neoplasm of upper-inner quadrant of right female breast: Secondary | ICD-10-CM

## 2023-07-04 DIAGNOSIS — Z170 Estrogen receptor positive status [ER+]: Secondary | ICD-10-CM

## 2023-07-04 NOTE — Assessment & Plan Note (Signed)
05/27/2021:Right breast biopsy 05/27/2021: 4 cm of grade 3 IDC with DCIS ER 35%, PR 80%, Ki-67 60%, HER2 1+ negative,T2 N0 stage IIA Oncotype score: 30: High risk Neoadjuvant chemo with Taxotere and Cytoxan x4 cycles completed 08/27/2021    10/16/2021:Right lumpectomy: Residual grade 2 IDC 0.4 cm, residual DCIS high-grade, margins negative, 0/4 lymph nodes negative, ER 40% weak staining, PR 20% moderate staining, Ki-67 less than 1%, HER2 equal vocal 2+ by IHC, FISH negative, RCB class I   Treatment plan: 1. Adjuvant radiation completed 12/15/21 2. we stopped Zoladex injections because the patient is contemplating on having children 3.  Current treatment: Tamoxifen 20 mg daily   Breast cancer surveillance: 1.  Breast exam 06/17/2023: Benign 2. Mammogram 05/27/2023: Indeterminate calcifications bilateral breasts, right breast: 1.8 cm Right breast biopsy 06/10/2023: Grade 2 IDC with DCIS: ER 0%, PR 90%, Ki-67 15%, HER2 0-1+ negative Left breast biopsy: Benign   Treatment plan: Right mastectomy with reconstruction versus another lumpectomy Adjuvant Xeloda for 6 months versus adjuvant chemotherapy with Adriamycin and Cytoxan followed by Taxol Discontinue tamoxifen at this time   CT CAP 06/30/2023: No evidence of metastatic disease hepatomegaly Bone scan 07/03/2023: No evidence of bone metastases  Return to clinic after surgery to discuss final pathology report

## 2023-07-08 ENCOUNTER — Encounter: Payer: Self-pay | Admitting: *Deleted

## 2023-07-11 ENCOUNTER — Ambulatory Visit: Payer: Commercial Managed Care - PPO

## 2023-07-11 DIAGNOSIS — Z30431 Encounter for routine checking of intrauterine contraceptive device: Secondary | ICD-10-CM | POA: Diagnosis not present

## 2023-07-11 DIAGNOSIS — C50111 Malignant neoplasm of central portion of right female breast: Secondary | ICD-10-CM | POA: Diagnosis not present

## 2023-07-15 ENCOUNTER — Encounter: Payer: Self-pay | Admitting: Plastic Surgery

## 2023-07-15 ENCOUNTER — Ambulatory Visit (INDEPENDENT_AMBULATORY_CARE_PROVIDER_SITE_OTHER): Payer: Commercial Managed Care - PPO | Admitting: Plastic Surgery

## 2023-07-15 DIAGNOSIS — Z170 Estrogen receptor positive status [ER+]: Secondary | ICD-10-CM | POA: Diagnosis not present

## 2023-07-15 DIAGNOSIS — C50211 Malignant neoplasm of upper-inner quadrant of right female breast: Secondary | ICD-10-CM | POA: Diagnosis not present

## 2023-07-15 NOTE — Progress Notes (Signed)
   Subjective:    Patient ID: Amy Pittman, female    DOB: Sep 11, 1986, 37 y.o.   MRN: [id number]  The patient is a 37 year old female joining me by phone for further discussion about her breast reconstruction.  She was diagnosed with a right sided breast cancer in the upper inner quadrant and was trying to decide on a mastectomy versus lumpectomy.  It is estrogen negative.  She is 5 feet 5 inches tall and weighs 200 pounds.  Her breasts are asymmetric and both have ptosis.  After further discussion with oncology and general surgery she has decided on a right mastectomy.      Review of Systems  Constitutional: Negative.   HENT: Negative.    Eyes: Negative.   Respiratory: Negative.    Cardiovascular: Negative.   Gastrointestinal: Negative.   Endocrine: Negative.   Genitourinary: Negative.   Musculoskeletal: Negative.        Objective:   Physical Exam      Assessment & Plan:  No diagnosis found.   The patient would like to move forward with implant-based reconstruction probably a saline implant but expander to implant will be the plan.  I connected with  Amy Pittman on 07/15/23 by phone and verified that I am speaking with the correct person using two identifiers.  The patient was at home and I was at the office.  We spent 5 minutes in discussion.   I discussed the limitations of evaluation and management by telemedicine. The patient expressed understanding and agreed to proceed.

## 2023-07-19 ENCOUNTER — Encounter: Payer: Self-pay | Admitting: *Deleted

## 2023-07-27 ENCOUNTER — Telehealth: Payer: Self-pay | Admitting: Hematology and Oncology

## 2023-07-27 NOTE — Telephone Encounter (Signed)
Patient is aware of scheduled appointment times/dates

## 2023-08-01 ENCOUNTER — Encounter: Payer: Self-pay | Admitting: Plastic Surgery

## 2023-08-02 ENCOUNTER — Telehealth: Payer: Self-pay | Admitting: Plastic Surgery

## 2023-08-04 NOTE — Telephone Encounter (Signed)
LVM to schedule pre/post ops. Tentatively scheduled for 9/10 @ 11:20am

## 2023-08-15 ENCOUNTER — Ambulatory Visit: Payer: Commercial Managed Care - PPO | Attending: General Surgery

## 2023-08-15 VITALS — Wt 200.5 lb

## 2023-08-15 DIAGNOSIS — Z483 Aftercare following surgery for neoplasm: Secondary | ICD-10-CM | POA: Insufficient documentation

## 2023-08-15 NOTE — Therapy (Signed)
OUTPATIENT PHYSICAL THERAPY SOZO SCREENING NOTE   Patient Name: Amy Pittman MRN: [id number] DOB:1986-06-23, 37 y.o., female Today's Date: 08/15/2023  PCP: Oneita Hurt, No REFERRING PROVIDER: Griselda Miner, MD   PT End of Session - 08/15/23 1630     Visit Number 2   # unchanged due to screen only   PT Start Time 1627    PT Stop Time 1632    PT Time Calculation (min) 5 min    Activity Tolerance Patient tolerated treatment well    Behavior During Therapy WFL for tasks assessed/performed             Past Medical History:  Diagnosis Date   Breast cancer (HCC)    Cancer (HCC)    Generalized headaches    Generalized seizures [facility]) neurologist--  per pt followed by [facility] neurologist   per pt dx 2010 and per pt none since ,did have work-up negative , felt she was going to sleep Abscent seizure  and triggers are stress and exhaustion   Menorrhagia    Personal history of chemotherapy    Personal history of radiation therapy    Wears glasses    Past Surgical History:  Procedure Laterality Date   ANKLE ARTHROSCOPY Right 07/09/2005     dr Turner Daniels  [facility]   w/ Debridement and removal loose body   BIOPSY  12/01/2020   Procedure: BIOPSY;  Surgeon: Lynann Bologna, MD;  Location: [facility] ENDOSCOPY;  Service: Endoscopy;;   BREAST BIOPSY Right 06/10/2023   MM RT BREAST BX W LOC DEV 1ST LESION IMAGE BX SPEC STEREO GUIDE 06/10/2023 [facility] MAMMOGR[facility]   BREAST BIOPSY Left 06/10/2023   MM LT BREAST BX W LOC DEV 1ST LESION IMAGE BX SPEC STEREO GUIDE 06/10/2023 [facility]   BREAST LUMPECTOMY Right 10/16/2021   BREAST LUMPECTOMY WITH RADIOACTIVE SEED AND SENTINEL LYMPH NODE BIOPSY Right 10/16/2021   Procedure: RIGHT BREAST LUMPECTOMY WITH RADIOACTIVE SEED X2 AND SENTINEL LYMPH NODE BIOPSY;  Surgeon: Griselda Miner, MD;  Location: [facility];  Service: General;  Laterality: Right;   CHOLECYSTECTOMY N/A 12/02/2020   Procedure: LAPAROSCOPIC CHOLECYSTECTOMY WITH CHOLANGIOGRAM;   Surgeon: Harriette Bouillon, MD;  Location: [facility] OR;  Service: General;  Laterality: N/A;   DILATATION & CURETTAGE/HYSTEROSCOPY WITH MYOSURE N/A 11/24/2017   Procedure: DILATATION & CURETTAGE/HYSTEROSCOPY;  Surgeon: Carrington Clamp, MD;  Location: [facility];  Service: Gynecology;  Laterality: N/A;   ERCP N/A 12/01/2020   Procedure: ENDOSCOPIC RETROGRADE CHOLANGIOPANCREATOGRAPHY (ERCP);  Surgeon: Lynann Bologna, MD;  Location: [facility] ENDOSCOPY;  Service: Endoscopy;  Laterality: N/A;   IR IMAGING GUIDED PORT INSERTION  06/25/2021   IR REMOVAL TUN ACCESS W/ PORT W/O FL MOD SED  01/08/2022   REMOVAL OF STONES  12/01/2020   Procedure: REMOVAL OF STONES;  Surgeon: Lynann Bologna, MD;  Location: [facility] ENDOSCOPY;  Service: Endoscopy;;   SPHINCTEROTOMY  12/01/2020   Procedure: Dennison Mascot;  Surgeon: Lynann Bologna, MD;  Location: [facility] ENDOSCOPY;  Service: Endoscopy;;   Patient Active Problem List   Diagnosis Date Noted   Genetic testing 08/06/2021   Malignant neoplasm of upper-inner quadrant of right breast in female, estrogen receptor positive (HCC) 06/02/2021   RUQ abdominal pain    Gallstones    Choledocholithiasis    Pancreatitis 11/29/2020   Cellulitis of left upper eyelid 10/11/2018   Low back pain 05/28/2015   Awareness alteration, transient 03/07/2015   Routine health maintenance 05/10/2012   Generalized seizures (HCC)    Irregular menses  REFERRING DIAG: right breast cancer at risk for lymphedema  THERAPY DIAG: Aftercare following surgery for neoplasm  PERTINENT HISTORY: Patient reports she was diagnosed on 05/27/2021 with right grade III invasive ductal carcinoma breast cancer. She underwent neoadjuvant chemotherapy 06/26/2021 - 08/27/2021. Then she had a right lumpectomy and sentinel node biopsy (4 negative nodes) on 10/16/2021. It is ER/PR positive and HER2 negative with a Ki67 of 60%. She has a hx of a seizure disorder but no seizures since 2010  PRECAUTIONS: right UE  Lymphedema risk, None  SUBJECTIVE: Pt returns for her 3 month L-Dex screen. "My cancer came back in my Rt breast and I have to have a mastectomy now on 09/05/23. I'm going to have an expander placed and go that route."   PAIN:  Are you having pain? No  SOZO SCREENING: Patient was assessed today using the SOZO machine to determine the lymphedema index score. This was compared to her baseline score. It was determined that she is within the recommended range when compared to her baseline and no further action is needed at this time. She will continue SOZO screenings. These are done every 3 months for 2 years post operatively followed by every 6 months for 2 years, and then annually. Suggested pt speak with her plastic surgeon tomorrow about if she would like [place] to see her 3 weeks post op as we normally do. If so to send [place] a referral for this.    L-DEX FLOWSHEETS - 08/15/23 1600       L-DEX LYMPHEDEMA SCREENING   Measurement Type Unilateral    L-DEX MEASUREMENT EXTREMITY Upper Extremity    POSITION  Standing    DOMINANT SIDE Right    At Risk Side Right    BASELINE SCORE (UNILATERAL) -2.4    L-DEX SCORE (UNILATERAL) -1.5    VALUE CHANGE (UNILAT) 0.9               Hermenia Bers, PTA 08/15/2023, 4:38 PM

## 2023-08-16 ENCOUNTER — Encounter: Payer: Self-pay | Admitting: Student

## 2023-08-16 ENCOUNTER — Other Ambulatory Visit (HOSPITAL_COMMUNITY): Payer: Self-pay

## 2023-08-16 ENCOUNTER — Ambulatory Visit (INDEPENDENT_AMBULATORY_CARE_PROVIDER_SITE_OTHER): Payer: Commercial Managed Care - PPO | Admitting: Student

## 2023-08-16 VITALS — BP 111/76 | HR 72 | Ht 65.0 in | Wt 198.8 lb

## 2023-08-16 DIAGNOSIS — C50211 Malignant neoplasm of upper-inner quadrant of right female breast: Secondary | ICD-10-CM

## 2023-08-16 DIAGNOSIS — Z170 Estrogen receptor positive status [ER+]: Secondary | ICD-10-CM

## 2023-08-16 MED ORDER — DIAZEPAM 2 MG PO TABS
2.0000 mg | ORAL_TABLET | Freq: Two times a day (BID) | ORAL | 0 refills | Status: DC | PRN
Start: 2023-08-16 — End: 2024-08-29
  Filled 2023-08-16: qty 20, 10d supply, fill #0

## 2023-08-16 MED ORDER — ONDANSETRON HCL 4 MG PO TABS
4.0000 mg | ORAL_TABLET | Freq: Three times a day (TID) | ORAL | 0 refills | Status: DC | PRN
  Filled 2023-08-16: qty 20, 7d supply, fill #0

## 2023-08-16 MED ORDER — CEPHALEXIN 500 MG PO CAPS
500.0000 mg | ORAL_CAPSULE | Freq: Four times a day (QID) | ORAL | 0 refills | Status: AC
  Filled 2023-08-16: qty 12, 3d supply, fill #0

## 2023-08-16 MED ORDER — OXYCODONE HCL 5 MG PO TABS
5.0000 mg | ORAL_TABLET | Freq: Four times a day (QID) | ORAL | 0 refills | Status: DC | PRN
Start: 2023-08-16 — End: 2024-08-29
  Filled 2023-08-16: qty 20, 5d supply, fill #0
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
## 2023-08-18 ENCOUNTER — Ambulatory Visit (HOSPITAL_COMMUNITY): Payer: Commercial Managed Care - PPO

## 2023-08-24 ENCOUNTER — Encounter: Payer: Self-pay | Admitting: *Deleted

## 2023-08-25 ENCOUNTER — Ambulatory Visit: Payer: Commercial Managed Care - PPO | Admitting: Hematology and Oncology

## 2023-08-29 ENCOUNTER — Encounter (HOSPITAL_BASED_OUTPATIENT_CLINIC_OR_DEPARTMENT_OTHER): Payer: Self-pay | Admitting: General Surgery

## 2023-08-29 ENCOUNTER — Other Ambulatory Visit: Payer: Self-pay

## 2023-08-29 NOTE — Progress Notes (Signed)
Patient given presurgical drink and presurgical soap. Education provided and patient verbalized understanding.

## 2023-08-30 ENCOUNTER — Inpatient Hospital Stay: Payer: Commercial Managed Care - PPO | Admitting: Hematology and Oncology

## 2023-09-02 ENCOUNTER — Telehealth: Payer: Self-pay | Admitting: Hematology and Oncology

## 2023-09-02 NOTE — Telephone Encounter (Signed)
Patient is aware of rescheduled appointment times/dates 

## 2023-09-05 ENCOUNTER — Encounter: Payer: Self-pay | Admitting: *Deleted

## 2023-09-05 ENCOUNTER — Ambulatory Visit (HOSPITAL_BASED_OUTPATIENT_CLINIC_OR_DEPARTMENT_OTHER): Payer: Commercial Managed Care - PPO | Admitting: Anesthesiology

## 2023-09-05 ENCOUNTER — Ambulatory Visit (HOSPITAL_COMMUNITY)
Admission: RE | Admit: 2023-09-05 | Discharge: 2023-09-05 | Disposition: A | Discharge status: Home / Self Care | Payer: Commercial Managed Care - PPO | Source: Ambulatory Visit | Attending: General Surgery | Admitting: General Surgery

## 2023-09-05 ENCOUNTER — Other Ambulatory Visit: Payer: Self-pay

## 2023-09-05 ENCOUNTER — Encounter (HOSPITAL_BASED_OUTPATIENT_CLINIC_OR_DEPARTMENT_OTHER)
Admission: RE | Disposition: A | Discharge status: Home / Self Care | Payer: Self-pay | Source: Home / Self Care | Attending: Plastic Surgery

## 2023-09-05 ENCOUNTER — Observation Stay (HOSPITAL_BASED_OUTPATIENT_CLINIC_OR_DEPARTMENT_OTHER)
Admission: RE | Admit: 2023-09-05 | Discharge: 2023-09-06 | Disposition: A | Discharge status: Home / Self Care | Payer: Commercial Managed Care - PPO | Attending: Plastic Surgery | Admitting: Plastic Surgery

## 2023-09-05 ENCOUNTER — Encounter (HOSPITAL_BASED_OUTPATIENT_CLINIC_OR_DEPARTMENT_OTHER): Payer: Self-pay | Admitting: General Surgery

## 2023-09-05 DIAGNOSIS — C50919 Malignant neoplasm of unspecified site of unspecified female breast: Secondary | ICD-10-CM | POA: Diagnosis present

## 2023-09-05 DIAGNOSIS — Z170 Estrogen receptor positive status [ER+]: Secondary | ICD-10-CM | POA: Diagnosis not present

## 2023-09-05 DIAGNOSIS — Z421 Encounter for breast reconstruction following mastectomy: Secondary | ICD-10-CM

## 2023-09-05 DIAGNOSIS — Z01818 Encounter for other preprocedural examination: Principal | ICD-10-CM

## 2023-09-05 DIAGNOSIS — C50911 Malignant neoplasm of unspecified site of right female breast: Secondary | ICD-10-CM

## 2023-09-05 DIAGNOSIS — C50211 Malignant neoplasm of upper-inner quadrant of right female breast: Secondary | ICD-10-CM | POA: Diagnosis not present

## 2023-09-05 DIAGNOSIS — C50411 Malignant neoplasm of upper-outer quadrant of right female breast: Secondary | ICD-10-CM | POA: Diagnosis not present

## 2023-09-05 DIAGNOSIS — Z171 Estrogen receptor negative status [ER-]: Secondary | ICD-10-CM | POA: Insufficient documentation

## 2023-09-05 HISTORY — PX: BREAST RECONSTRUCTION WITH PLACEMENT OF TISSUE EXPANDER AND FLEX HD (ACELLULAR HYDRATED DERMIS): SHX6295

## 2023-09-05 HISTORY — PX: MASTECTOMY W/ SENTINEL NODE BIOPSY: SHX2001

## 2023-09-05 LAB — POCT PREGNANCY, URINE: Preg Test, Ur: NEGATIVE

## 2023-09-05 SURGERY — MASTECTOMY WITH SENTINEL LYMPH NODE BIOPSY
Anesthesia: Regional | Site: Breast | Laterality: Right

## 2023-09-05 MED ORDER — OXYCODONE HCL 5 MG PO TABS
5.0000 mg | ORAL_TABLET | ORAL | Status: DC | PRN
  Administered 2023-09-05 (×2): 5 mg via ORAL
  Filled 2023-09-05 (×2): qty 1

## 2023-09-05 MED ORDER — LACTATED RINGERS IV SOLN: INTRAVENOUS | Status: DC

## 2023-09-05 MED ORDER — ROPIVACAINE HCL 5 MG/ML IJ SOLN
INTRAMUSCULAR | Status: DC | PRN
Start: 2023-09-05 — End: 2023-09-05
  Administered 2023-09-05: 30 mL

## 2023-09-05 MED ORDER — MIDAZOLAM HCL 2 MG/2ML IJ SOLN
INTRAMUSCULAR | Status: AC
  Filled 2023-09-05: qty 2

## 2023-09-05 MED ORDER — VASHE WOUND IRRIGATION OPTIME
TOPICAL | Status: DC | PRN
  Administered 2023-09-05: 34 [oz_av] via TOPICAL

## 2023-09-05 MED ORDER — IBUPROFEN 200 MG PO TABS
200.0000 mg | ORAL_TABLET | Freq: Four times a day (QID) | ORAL | Status: DC
  Administered 2023-09-05: 200 mg via ORAL
  Filled 2023-09-05 (×2): qty 1

## 2023-09-05 MED ORDER — ONDANSETRON HCL 4 MG/2ML IJ SOLN: 4.0000 mg | Freq: Four times a day (QID) | INTRAMUSCULAR | Status: DC | PRN

## 2023-09-05 MED ORDER — DIPHENHYDRAMINE HCL 12.5 MG/5ML PO ELIX: 12.5000 mg | ORAL_SOLUTION | Freq: Four times a day (QID) | ORAL | Status: DC | PRN

## 2023-09-05 MED ORDER — CEFAZOLIN SODIUM-DEXTROSE 2-4 GM/100ML-% IV SOLN
INTRAVENOUS | Status: AC
  Filled 2023-09-05: qty 100

## 2023-09-05 MED ORDER — LIDOCAINE 2% (20 MG/ML) 5 ML SYRINGE
INTRAMUSCULAR | Status: DC | PRN
Start: 2023-09-05 — End: 2023-09-05
  Administered 2023-09-05: 60 mg via INTRAVENOUS

## 2023-09-05 MED ORDER — CHLORHEXIDINE GLUCONATE CLOTH 2 % EX PADS: 6.0000 | MEDICATED_PAD | Freq: Once | CUTANEOUS | Status: DC

## 2023-09-05 MED ORDER — DEXAMETHASONE SODIUM PHOSPHATE 10 MG/ML IJ SOLN
INTRAMUSCULAR | Status: DC | PRN
Start: 2023-09-05 — End: 2023-09-05
  Administered 2023-09-05: 10 mg

## 2023-09-05 MED ORDER — HYDROMORPHONE HCL 1 MG/ML IJ SOLN: 0.2500 mg | INTRAMUSCULAR | Status: DC | PRN

## 2023-09-05 MED ORDER — GABAPENTIN 100 MG PO CAPS
ORAL_CAPSULE | ORAL | Status: AC
  Filled 2023-09-05: qty 1

## 2023-09-05 MED ORDER — PROPOFOL 10 MG/ML IV BOLUS
INTRAVENOUS | Status: DC | PRN
  Administered 2023-09-05: 180 mg via INTRAVENOUS

## 2023-09-05 MED ORDER — ROCURONIUM BROMIDE 10 MG/ML (PF) SYRINGE
PREFILLED_SYRINGE | INTRAVENOUS | Status: DC | PRN
  Administered 2023-09-05: 60 mg via INTRAVENOUS
  Administered 2023-09-05: 40 mg via INTRAVENOUS

## 2023-09-05 MED ORDER — ACETAMINOPHEN 500 MG PO TABS: 1000.0000 mg | ORAL_TABLET | ORAL | Status: AC

## 2023-09-05 MED ORDER — DROPERIDOL 2.5 MG/ML IJ SOLN: 0.6250 mg | Freq: Once | INTRAMUSCULAR | Status: DC | PRN

## 2023-09-05 MED ORDER — CEFAZOLIN SODIUM-DEXTROSE 2-4 GM/100ML-% IV SOLN: 2.0000 g | INTRAVENOUS | Status: AC

## 2023-09-05 MED ORDER — LIDOCAINE 20MG/ML (2%) 15 ML SYRINGE OPTIME
INTRAMUSCULAR | Status: DC | PRN
Start: 2023-09-05 — End: 2023-09-05

## 2023-09-05 MED ORDER — POLYETHYLENE GLYCOL 3350 17 G PO PACK: 17.0000 g | PACK | Freq: Every day | ORAL | Status: DC | PRN

## 2023-09-05 MED ORDER — FENTANYL CITRATE (PF) 100 MCG/2ML IJ SOLN
100.0000 ug | Freq: Once | INTRAMUSCULAR | Status: AC
  Administered 2023-09-05: 100 ug via INTRAVENOUS

## 2023-09-05 MED ORDER — CEFAZOLIN SODIUM-DEXTROSE 2-3 GM-%(50ML) IV SOLR
INTRAVENOUS | Status: DC | PRN
  Administered 2023-09-05: 2 g via INTRAVENOUS

## 2023-09-05 MED ORDER — CELECOXIB 200 MG PO CAPS
200.0000 mg | ORAL_CAPSULE | Freq: Once | ORAL | Status: AC
  Administered 2023-09-05: 200 mg via ORAL

## 2023-09-05 MED ORDER — ZOLPIDEM TARTRATE 5 MG PO TABS: 5.0000 mg | ORAL_TABLET | Freq: Every evening | ORAL | Status: DC | PRN

## 2023-09-05 MED ORDER — MIDAZOLAM HCL 2 MG/2ML IJ SOLN
2.0000 mg | Freq: Once | INTRAMUSCULAR | Status: AC
  Administered 2023-09-05: 2 mg via INTRAVENOUS

## 2023-09-05 MED ORDER — FENTANYL CITRATE (PF) 100 MCG/2ML IJ SOLN
INTRAMUSCULAR | Status: AC
  Filled 2023-09-05: qty 2

## 2023-09-05 MED ORDER — ACETAMINOPHEN 325 MG PO TABS
325.0000 mg | ORAL_TABLET | Freq: Four times a day (QID) | ORAL | Status: DC
  Administered 2023-09-05 – 2023-09-06 (×2): 325 mg via ORAL
  Filled 2023-09-05 (×2): qty 1

## 2023-09-05 MED ORDER — DIAZEPAM 2 MG PO TABS: 2.0000 mg | ORAL_TABLET | Freq: Two times a day (BID) | ORAL | Status: DC | PRN

## 2023-09-05 MED ORDER — SENNA 8.6 MG PO TABS
1.0000 | ORAL_TABLET | Freq: Two times a day (BID) | ORAL | Status: DC
  Filled 2023-09-05: qty 1

## 2023-09-05 MED ORDER — ONDANSETRON HCL 4 MG/2ML IJ SOLN
INTRAMUSCULAR | Status: DC | PRN
  Administered 2023-09-05: 4 mg via INTRAVENOUS

## 2023-09-05 MED ORDER — CEFAZOLIN SODIUM-DEXTROSE 2-4 GM/100ML-% IV SOLN
2.0000 g | Freq: Three times a day (TID) | INTRAVENOUS | Status: DC
  Administered 2023-09-05 – 2023-09-06 (×2): 2 g via INTRAVENOUS
  Filled 2023-09-05 (×2): qty 100

## 2023-09-05 MED ORDER — GABAPENTIN 100 MG PO CAPS
100.0000 mg | ORAL_CAPSULE | ORAL | Status: AC
  Administered 2023-09-05: 100 mg via ORAL

## 2023-09-05 MED ORDER — TECHNETIUM TC 99M TILMANOCEPT KIT
1.0000 | PACK | Freq: Once | INTRAVENOUS | Status: AC | PRN
  Administered 2023-09-05: 1 via INTRADERMAL

## 2023-09-05 MED ORDER — FENTANYL CITRATE (PF) 100 MCG/2ML IJ SOLN
INTRAMUSCULAR | Status: DC | PRN
  Administered 2023-09-05 (×2): 50 ug via INTRAVENOUS

## 2023-09-05 MED ORDER — DEXAMETHASONE SODIUM PHOSPHATE 10 MG/ML IJ SOLN
INTRAMUSCULAR | Status: DC | PRN
  Administered 2023-09-05: 10 mg via INTRAVENOUS

## 2023-09-05 MED ORDER — BISACODYL 10 MG RE SUPP: 10.0000 mg | Freq: Every day | RECTAL | Status: DC | PRN

## 2023-09-05 MED ORDER — ONDANSETRON 4 MG PO TBDP: 4.0000 mg | ORAL_TABLET | Freq: Four times a day (QID) | ORAL | Status: DC | PRN

## 2023-09-05 MED ORDER — DIPHENHYDRAMINE HCL 50 MG/ML IJ SOLN: 12.5000 mg | Freq: Four times a day (QID) | INTRAMUSCULAR | Status: DC | PRN

## 2023-09-05 MED ORDER — CELECOXIB 200 MG PO CAPS
ORAL_CAPSULE | ORAL | Status: AC
  Filled 2023-09-05: qty 1

## 2023-09-05 MED ORDER — ACETAMINOPHEN 500 MG PO TABS
ORAL_TABLET | ORAL | Status: AC
  Filled 2023-09-05: qty 2

## 2023-09-05 MED ORDER — KCL IN DEXTROSE-NACL 20-5-0.45 MEQ/L-%-% IV SOLN
INTRAVENOUS | Status: DC
  Filled 2023-09-05: qty 1000

## 2023-09-05 MED ORDER — FENTANYL CITRATE (PF) 100 MCG/2ML IJ SOLN: 12.5000 ug | INTRAMUSCULAR | Status: DC | PRN

## 2023-09-05 MED ORDER — ACETAMINOPHEN 500 MG PO TABS
1000.0000 mg | ORAL_TABLET | Freq: Once | ORAL | Status: AC
  Administered 2023-09-05: 1000 mg via ORAL

## 2023-09-05 MED ORDER — PROPOFOL 10 MG/ML IV BOLUS
INTRAVENOUS | Status: AC
  Filled 2023-09-05: qty 20

## 2023-09-05 SURGICAL SUPPLY — 86 items
ADH SKN CLS APL DERMABOND .7 (GAUZE/BANDAGES/DRESSINGS) ×2
APL PRP STRL LF DISP 70% ISPRP (MISCELLANEOUS) ×2
APPLIER CLIP 9.375 MED OPEN (MISCELLANEOUS) ×1
APR CLP MED 9.3 20 MLT OPN (MISCELLANEOUS) ×1
BAG DECANTER FOR FLEXI CONT (MISCELLANEOUS) ×1 IMPLANT
BINDER BREAST XLRG (GAUZE/BANDAGES/DRESSINGS) IMPLANT
BIOPATCH RED 1 DISK 7.0 (GAUZE/BANDAGES/DRESSINGS) ×1 IMPLANT
BLADE HEX COATED 2.75 (ELECTRODE) ×1 IMPLANT
BLADE SURG 10 STRL SS (BLADE) ×1 IMPLANT
BLADE SURG 15 STRL LF DISP TIS (BLADE) ×1 IMPLANT
BLADE SURG 15 STRL SS (BLADE) ×1
BNDG GAUZE DERMACEA FLUFF 4 (GAUZE/BANDAGES/DRESSINGS) ×2 IMPLANT
BNDG GZE DERMACEA 4 6PLY (GAUZE/BANDAGES/DRESSINGS)
CANISTER SUCT 1200ML W/VALVE (MISCELLANEOUS) ×1 IMPLANT
CHLORAPREP W/TINT 26 (MISCELLANEOUS) ×1 IMPLANT
CLEANSER WND VASHE 34 (WOUND CARE) IMPLANT
CLIP APPLIE 9.375 MED OPEN (MISCELLANEOUS) ×1 IMPLANT
COVER BACK TABLE 60X90IN (DRAPES) ×1 IMPLANT
COVER MAYO STAND STRL (DRAPES) ×1 IMPLANT
COVER PROBE CYLINDRICAL 5X96 (MISCELLANEOUS) ×1 IMPLANT
DERMABOND ADVANCED .7 DNX12 (GAUZE/BANDAGES/DRESSINGS) ×1 IMPLANT
DEVICE DSSCT PLSMBLD 3.0S LGHT (MISCELLANEOUS) ×1 IMPLANT
DRAIN CHANNEL 19F RND (DRAIN) ×1 IMPLANT
DRAPE LAPAROSCOPIC ABDOMINAL (DRAPES) ×1 IMPLANT
DRAPE UTILITY XL STRL (DRAPES) ×1 IMPLANT
DRSG MEPILEX POST OP 4X8 (GAUZE/BANDAGES/DRESSINGS) IMPLANT
DRSG OPSITE POSTOP 4X6 (GAUZE/BANDAGES/DRESSINGS) IMPLANT
DRSG TEGADERM 2-3/8X2-3/4 SM (GAUZE/BANDAGES/DRESSINGS) ×1 IMPLANT
ELECT BLADE 4.0 EZ CLEAN MEGAD (MISCELLANEOUS) ×1
ELECT COATED BLADE 2.86 ST (ELECTRODE) IMPLANT
ELECT REM PT RETURN 9FT ADLT (ELECTROSURGICAL) ×1
ELECTRODE BLDE 4.0 EZ CLN MEGD (MISCELLANEOUS) ×1 IMPLANT
ELECTRODE REM PT RTRN 9FT ADLT (ELECTROSURGICAL) ×1 IMPLANT
EVACUATOR SILICONE 100CC (DRAIN) ×1 IMPLANT
FUNNEL KELLER 2 DISP (MISCELLANEOUS) IMPLANT
GAUZE PAD ABD 8X10 STRL (GAUZE/BANDAGES/DRESSINGS) ×2 IMPLANT
GAUZE SPONGE 4X4 12PLY STRL LF (GAUZE/BANDAGES/DRESSINGS) ×1 IMPLANT
GLOVE BIO SURGEON STRL SZ 6.5 (GLOVE) ×2 IMPLANT
GLOVE BIO SURGEON STRL SZ7.5 (GLOVE) ×1 IMPLANT
GLOVE BIOGEL PI IND STRL 8 (GLOVE) IMPLANT
GOWN STRL REUS W/ TWL LRG LVL3 (GOWN DISPOSABLE) ×3 IMPLANT
GOWN STRL REUS W/ TWL XL LVL3 (GOWN DISPOSABLE) IMPLANT
GOWN STRL REUS W/TWL LRG LVL3 (GOWN DISPOSABLE) ×4
GOWN STRL REUS W/TWL XL LVL3 (GOWN DISPOSABLE) ×1
HEMOSTAT ARISTA ABSORB 3G PWDR (HEMOSTASIS) IMPLANT
IMPL EXPANDER BREAST 535CC (Breast) IMPLANT
IMPLANT EXPANDER BREAST 535CC (Breast) ×1 IMPLANT
IV NS 1000ML (IV SOLUTION)
IV NS 1000ML BAXH (IV SOLUTION) IMPLANT
IV NS 500ML (IV SOLUTION) ×1
IV NS 500ML BAXH (IV SOLUTION) ×1 IMPLANT
KIT FILL ASEPTIC TRANSFER (MISCELLANEOUS) IMPLANT
NDL HYPO 25X1 1.5 SAFETY (NEEDLE) ×1 IMPLANT
NEEDLE HYPO 25X1 1.5 SAFETY (NEEDLE)
NS IRRIG 1000ML POUR BTL (IV SOLUTION) ×1 IMPLANT
PACK BASIN DAY SURGERY FS (CUSTOM PROCEDURE TRAY) ×1 IMPLANT
PAD FOAM SILICONE BACKED (GAUZE/BANDAGES/DRESSINGS) IMPLANT
PENCIL SMOKE EVACUATOR (MISCELLANEOUS) ×1 IMPLANT
PIN SAFETY STERILE (MISCELLANEOUS) ×1 IMPLANT
PLASMABLADE 3.0S W/LIGHT (MISCELLANEOUS) ×1
SLEEVE SCD COMPRESS KNEE MED (STOCKING) ×1 IMPLANT
SPIKE FLUID TRANSFER (MISCELLANEOUS) IMPLANT
SPONGE T-LAP 18X18 ~~LOC~~+RFID (SPONGE) ×2 IMPLANT
STRIP SUTURE WOUND CLOSURE 1/2 (MISCELLANEOUS) IMPLANT
SUT ETHILON 2 0 FS 18 (SUTURE) ×1 IMPLANT
SUT MNCRL AB 4-0 PS2 18 (SUTURE) ×1 IMPLANT
SUT MON AB 3-0 SH 27 (SUTURE)
SUT MON AB 3-0 SH27 (SUTURE) ×1 IMPLANT
SUT MON AB 5-0 PS2 18 (SUTURE) IMPLANT
SUT PDS 3-0 CT2 (SUTURE) ×1
SUT PDS AB 2-0 CT2 27 (SUTURE) IMPLANT
SUT PDS II 3-0 CT2 27 ABS (SUTURE) IMPLANT
SUT SILK 2 0 SH (SUTURE) IMPLANT
SUT SILK 3 0 PS 1 (SUTURE) ×1 IMPLANT
SUT VIC AB 3-0 SH 27 (SUTURE)
SUT VIC AB 3-0 SH 27X BRD (SUTURE) IMPLANT
SUT VICRYL 3-0 CR8 SH (SUTURE) ×1 IMPLANT
SYR BULB IRRIG 60ML STRL (SYRINGE) ×1 IMPLANT
SYR CONTROL 10ML LL (SYRINGE) ×1 IMPLANT
TISSUE FLEXHD PERF PLIAB 8X16 (Tissue) IMPLANT
TOWEL GREEN STERILE FF (TOWEL DISPOSABLE) ×2 IMPLANT
TRAY DSU PREP LF (CUSTOM PROCEDURE TRAY) ×1 IMPLANT
TRAY FOLEY W/BAG SLVR 14FR LF (SET/KITS/TRAYS/PACK) IMPLANT
TUBE CONNECTING 20X1/4 (TUBING) ×1 IMPLANT
UNDERPAD 30X36 HEAVY ABSORB (UNDERPADS AND DIAPERS) ×2 IMPLANT
YANKAUER SUCT BULB TIP NO VENT (SUCTIONS) ×1 IMPLANT

## 2023-09-05 NOTE — Anesthesia Procedure Notes (Addendum)
Procedure Name: Intubation Date/Time: 09/05/2023 9:23 AM  Performed by: Alvera Novel, CRNAPre-anesthesia Checklist: Patient identified, Emergency Drugs available, Suction available and Patient being monitored Patient Re-evaluated:Patient Re-evaluated prior to induction Oxygen Delivery Method: Circle System Utilized Preoxygenation: Pre-oxygenation with 100% oxygen Induction Type: IV induction Ventilation: Mask ventilation without difficulty Laryngoscope Size: Mac and 4 Grade View: Grade I Tube type: Oral Tube size: 7.0 mm Number of attempts: 1 Airway Equipment and Method: Stylet Placement Confirmation: ETT inserted through vocal cords under direct vision, positive ETCO2 and breath sounds checked- equal and bilateral Secured at: 22 cm Tube secured with: Tape Dental Injury: Teeth and Oropharynx as per pre-operative assessment

## 2023-09-05 NOTE — Interval H&P Note (Signed)
History and Physical Interval Note:  09/05/2023 8:59 AM  Amy Pittman  has presented today for surgery, with the diagnosis of RIGHT BREAST CANCER.  The various methods of treatment have been discussed with the patient and family. After consideration of risks, benefits and other options for treatment, the patient has consented to  Procedure(s): RIGHT MASTECTOMY WITH SENTINEL LYMPH NODE BIOPSY (Right) RIGHT BREAST RECONSTRUCTION WITH PLACEMENT OF TISSUE EXPANDER AND FLEX HD (ACELLULAR HYDRATED DERMIS) (Right) as a surgical intervention.  The patient's history has been reviewed, patient examined, no change in status, stable for surgery.  I have reviewed the patient's chart and labs.  Questions were answered to the patient's satisfaction.     [name]

## 2023-09-05 NOTE — Transfer of Care (Signed)
Immediate Anesthesia Transfer of Care Note  Patient: Alexiz Cothran Solem  Procedure(s) Performed: RIGHT MASTECTOMY WITH SENTINEL LYMPH NODE BIOPSY (Right: Breast) RIGHT BREAST RECONSTRUCTION WITH PLACEMENT OF TISSUE EXPANDER AND FLEX HD (ACELLULAR HYDRATED DERMIS) (Right: Breast)  Patient Location: PACU  Anesthesia Type:General  Level of Consciousness: drowsy  Airway & Oxygen Therapy: Patient Spontanous Breathing and Patient connected to face mask oxygen  Post-op Assessment: Report given to RN and Post -op Vital signs reviewed and stable  Post vital signs: Reviewed and stable  Last Vitals:  Vitals Value Taken Time  BP 105/73 09/05/23 1122  Temp    Pulse 74 09/05/23 1125  Resp 20 09/05/23 1125  SpO2 98 % 09/05/23 1125  Vitals shown include unfiled device data.  Last Pain:  Vitals:   09/05/23 0719  TempSrc: Temporal  PainSc: 0-No pain         Complications: No notable events documented.

## 2023-09-05 NOTE — Progress Notes (Signed)
Assisted Dr. Greg Stoltzfus with right, pectoralis, ultrasound guided block. Side rails up, monitors on throughout procedure. See vital signs in flow sheet. Tolerated Procedure well. 

## 2023-09-05 NOTE — Discharge Instructions (Addendum)
INSTRUCTIONS FOR AFTER BREAST SURGERY   You will likely have some questions about what to expect following your operation.  The following information will help you and your family understand what to expect when you are discharged from the hospital.  It is important to follow these guidelines to help ensure a smooth recovery and reduce complication.  Postoperative instructions include information on: diet, wound care, medications and physical activity.  AFTER SURGERY Expect to go home after the procedure.  In some cases, you may need to spend one night in the hospital for observation.  DIET Breast surgery does not require a specific diet.  However, the healthier you eat the better your body will heal. It is important to increasing your protein intake.  This means limiting the foods with sugar and carbohydrates.  Focus on vegetables and some meat.  If you have liposuction during your procedure be sure to drink water.  If your urine is bright yellow, then it is concentrated, and you need to drink more water.  As a general rule after surgery, you should have 8 ounces of water every hour while awake.  If you find you are persistently nauseated or unable to take in liquids let us know.  NO TOBACCO USE or EXPOSURE.  This will slow your healing process and lead to a wound.  WOUND CARE Leave the binder on for 3 days . Use fragrance free soap like Dial, Dove or [place].   After 3 days you can remove the binder to shower. Once dry apply binder or sports bra. If you have liposuction you will have a soft and spongy dressing (Lipofoam) that helps prevent creases in your skin.  Remove before you shower and then replace it.  It is also available on [employer]. If you have steri-strips / tape directly attached to your skin leave them in place. It is OK to get these wet.   No baths, pools or hot tubs for four weeks. We close your incision to leave the smallest and best-looking scar. No ointment or creams on your incisions  for four weeks.  No Neosporin (Too many skin reactions).  A few weeks after surgery you can use Mederma and start massaging the scar. We ask you to wear your binder or sports bra for the first 6 weeks around the clock, including while sleeping. This provides added comfort and helps reduce the fluid accumulation at the surgery site. NO Ice or heating pads to the operative site.  You have a very high risk of a BURN before you feel the temperature change.  ACTIVITY No heavy lifting until cleared by the doctor.  This usually means no more than a half-gallon of milk.  It is OK to walk and climb stairs. Moving your legs is very important to decrease your risk of a blood clot.  It will also help keep you from getting deconditioned.  Every 1 to 2 hours get up and walk for 5 minutes. This will help with a quicker recovery back to normal.  Let pain be your guide so you don't do too much.  This time is for you to recover.  You will be more comfortable if you sleep and rest with your head elevated either with a few pillows under you or in a recliner.  No stomach sleeping for a three months.  WORK Everyone returns to work at different times. As a rough guide, most people take at least 1 - 2 weeks off prior to returning to work. If  you need documentation for your job, give the forms to the front staff at the clinic.  DRIVING Arrange for someone to bring you home from the hospital after your surgery.  You may be able to drive a few days after surgery but not while taking any narcotics or valium.  BOWEL MOVEMENTS Constipation can occur after anesthesia and while taking pain medication.  It is important to stay ahead for your comfort.  We recommend taking Milk of Magnesia (2 tablespoons; twice a day) while taking the pain pills.  MEDICATIONS You may be prescribed should start after surgery At your preoperative visit for you history and physical you may have been given the following medications: An antibiotic: Start  this medication when you get home and take according to the instructions on the bottle. Zofran 4 mg:  This is to treat nausea and vomiting.  You can take this every 6 hours as needed and only if needed. Valium 2 mg for breast cancer patients: This is for muscle tightness if you have an implant or expander. This will help relax your muscle which also helps with pain control.  This can be taken every 12 hours as needed. Don't drive after taking this medication. Norco (hydrocodone/acetaminophen) 5/325 mg:  This is only to be used after you have taken the Motrin or the Tylenol. Every 8 hours as needed.   Over the counter Medication to take: Ibuprofen (Motrin) 600 mg:  Take this every 6 hours.  If you have additional pain then take 500 mg of the Tylenol every 8 hours.  Only take the Norco after you have tried these two. MiraLAX or Milk of Magnesia: Take this according to the bottle if you take the [place] Call your surgeon's office if any of the following occur: Fever 101 degrees F or greater Excessive bleeding or fluid from the incision site. Pain that increases over time without aid from the medications Redness, warmth, or pus draining from incision sites Persistent nausea or inability to take in liquids Severe misshapen area that underwent the operation.  Here are some resources for breast cancer patients:  Plastic surgery website: https://www.plasticsurgery.org/for-medical-professionals/education-and-resources/publications/breast-reconstruction-magazine Breast Reconstruction Awareness Campaign:  [employer]nz Plastic surgery Implant information:  https://www.plasticsurgery.org/patient-safety/breast-implant-safety  About my Ragen-Pratt Bulb Drain  What is a Pieratt-Pratt bulb? A Scobey-Pratt is a soft, round device used to collect drainage. It is connected to a long, thin drainage catheter, which is held in place by one or two small stiches near your  surgical incision site. When the bulb is squeezed, it forms a vacuum, forcing the drainage to empty into the bulb.  Emptying the Rendall-Pratt bulb- To empty the bulb: 1. Release the plug on the top of the bulb. 2. Pour the bulb's contents into a measuring container which your nurse will provide. 3. Record the time emptied and amount of drainage. Empty the drain(s) as often as your     doctor or nurse recommends.  Date                  Time                    Amount (Drain 1)                 Amount (Drain 2)  _____________________________________________________________________  _____________________________________________________________________  _____________________________________________________________________  _____________________________________________________________________  _____________________________________________________________________  _____________________________________________________________________  _____________________________________________________________________  _____________________________________________________________________  Squeezing the Rampy-Pratt Bulb- To squeeze the bulb: 1. Make sure the plug at the top of  the bulb is open. 2. Squeeze the bulb tightly in your fist. You will hear air squeezing from the bulb. 3. Replace the plug while the bulb is squeezed. 4. Use a safety pin to attach the bulb to your clothing. This will keep the catheter from     pulling at the bulb insertion site.  When to call your doctor- Call your doctor if: Drain site becomes red, swollen or hot. You have a fever greater than 101 degrees F. There is oozing at the drain site. Drain falls out (apply a guaze bandage over the drain hole and secure it with tape). Drainage increases daily not related to activity patterns. (You will usually have more drainage when you are active than when you are resting.) Drainage has a bad odor.

## 2023-09-05 NOTE — Anesthesia Procedure Notes (Signed)
Anesthesia Regional Block: Pectoralis block   Pre-Anesthetic Checklist: , timeout performed,  Correct Patient, Correct Site, Correct Laterality,  Correct Procedure, Correct Position, site marked,  Risks and benefits discussed,  Surgical consent,  Pre-op evaluation,  At surgeon's request and post-op pain management  Laterality: Right  Prep: Dura Prep       Needles:  Injection technique: Single-shot  Needle Type: Echogenic Stimulator Needle     Needle Length: 5cm  Needle Gauge: 20     Additional Needles:   Procedures:,,,, ultrasound used (permanent image in chart),,    Narrative:  Start time: 09/05/2023 8:30 AM End time: 09/05/2023 8:35 AM Injection made incrementally with aspirations every 5 mL.  Performed by: Personally  Anesthesiologist: Atilano Median, DO  Additional Notes: Patient identified. Risks/Benefits/Options discussed with patient including but not limited to bleeding, infection, nerve damage, failed block, incomplete pain control. Patient expressed understanding and wished to proceed. All questions were answered. Sterile technique was used throughout the entire procedure. Please see nursing notes for vital signs. Aspirated in 5cc intervals with injection for negative confirmation. Patient was given instructions on fall risk and not to get out of bed. All questions and concerns addressed with instructions to call with any issues or inadequate analgesia.

## 2023-09-05 NOTE — Op Note (Signed)
09/05/2023  10:34 AM  PATIENT:  Amy Pittman  37 y.o. female  PRE-OPERATIVE DIAGNOSIS:  RIGHT BREAST CANCER  POST-OPERATIVE DIAGNOSIS:  RIGHT BREAST CANCER  PROCEDURE:  Procedure(s): RIGHT MASTECTOMY WITH DEEP RIGHT AXILLARY SENTINEL LYMPH NODE BIOPSY (Right)  SURGEON:  Surgeons and Role: Panel 1:    Griselda Miner, MD - Primary  PHYSICIAN ASSISTANT:   ASSISTANTS: none   ANESTHESIA:   general  EBL:  minimal   BLOOD ADMINISTERED:none  DRAINS: none   LOCAL MEDICATIONS USED:  NONE  SPECIMEN:  Source of Specimen:  right mastectomy and sentinel nodes x 2  DISPOSITION OF SPECIMEN:  PATHOLOGY  COUNTS:  YES  TOURNIQUET:  * No tourniquets in log *  DICTATION: .Dragon Dictation  After informed consent was obtained the patient was brought to the operating room and placed in the supine position on the operating table.  After adequate induction of general anesthesia the patient's bilateral chest, breast, and axillary areas were prepped with ChloraPrep, allowed to dry, and draped in usual sterile manner.  An appropriate timeout was performed.  Earlier in the day the patient underwent injection of 1 mCi of technetium sulfur colloid in the subareolar position on the right.  At this point an elliptical incision was made around the nipple and areola complex in order to spare as much skin as possible.  The incision was carried through the skin and subcutaneous tissue sharply with the PlasmaBlade.  Breast hooks were used to elevate the skin flaps anteriorly towards the ceiling.  Thin skin flaps were then created by dissecting between the breast tissue and the subcutaneous fat and skin.  This dissection was carried circumferentially all the way to the chest wall muscle.  Next the breast was removed from the pectoralis muscle with the pectoralis fascia.  Once this dissection was complete then the entire right breast was removed from the patient.  It was marked with a stitch on the  lateral skin.  There was some radioactive signal in the tail of the breast and this was removed and sent as sentinel node #1.  The breast was then sent to pathology for further evaluation.  The neoprobe was then used to identify a radioactive signal in the deep right axillary space.  Dissection was carried out with the PlasmaBlade towards the signal under the direction of the neoprobe.  I was able to identify a lymph node which was excised sharply with the PlasmaBlade and the surrounding small vessels and lymphatics were controlled with clips.  Ex vivo counts on this node were 300.  No other hot or palpable nodes were identified in the right axilla.  The wound was irrigated with saline.  The wound was examined and found to be hemostatic.  At this point the operation was turned over to Dr. Ulice Bold for the reconstruction.  Her portion will be dictated separately.  All needle sponge and instrument counts were correct.  The patient was in stable condition.  PLAN OF CARE: Admit for overnight observation  PATIENT DISPOSITION:  PACU - hemodynamically stable.   Delay start of Pharmacological VTE agent (>24hrs) due to surgical blood loss or risk of bleeding: no

## 2023-09-05 NOTE — Op Note (Signed)
Op report    DATE OF OPERATION:  09/05/2023  LOCATION: [facility]  SURGICAL DIVISION: Plastic Surgery  PREOPERATIVE DIAGNOSES:  1. Right Breast cancer.    POSTOPERATIVE DIAGNOSES:  1. Right Breast cancer.   PROCEDURE:  1. Right immediate breast reconstruction with placement of Acellular Dermal Matrix and tissue expanders.  SURGEON: Foster Simpson, DO  ASSISTANT: Evelena Leyden, PA  ANESTHESIA:  General.   COMPLICATIONS: None.   IMPLANTS: Right - Mentor 535 cc. Ref #[id number], 200 cc of injectable saline placed in the expander. Acellular Dermal Matrix 8 x 16 cm Flex HD  INDICATIONS FOR PROCEDURE:  The patient, Amy Pittman, is a 37 y.o. female born on 1986/06/11, is here for  immediate first stage breast reconstruction with placement of a right tissue expander and Acellular dermal matrix. MRN: [id number]  CONSENT:  Informed consent was obtained directly from the patient. Risks, benefits and alternatives were fully discussed. Specific risks including but not limited to bleeding, infection, hematoma, seroma, scarring, pain, implant infection, implant extrusion, capsular contracture, asymmetry, wound healing problems, and need for further surgery were all discussed. The patient did have an ample opportunity to have her questions answered to her satisfaction.   DESCRIPTION OF PROCEDURE:  The patient was taken to the operating room by the general surgery team. SCDs were placed and IV antibiotics were given. The patient's chest was prepped and draped in a sterile fashion. A time out was performed and the implants to be used were identified.  A right mastectomy was performed.  Once the general surgery team had completed their portion of the case the patient was rendered to the plastic and reconstructive surgery team.  Right:  The pectoralis major muscle was lifted from the chest wall with release of the lateral edge and lateral inframammary fold.  The  pocket was irrigated with antibiotic solution and hemostasis was achieved with electrocautery.  The ADM was then prepared according to the manufacture guidelines and slits placed to help with postoperative fluid management.  The ADM was then sutured to the inferior and lateral edge of the inframammary fold with 2-0 PDS starting with an interrupted stitch and then a running stitch.  The lateral portion was sutured to with interrupted sutures after the expander was placed.  The expander was prepared according to the manufacture guidelines, the air evacuated and then it was placed under the ADM and pectoralis major muscle.  The inferior and lateral tabs were used to secure the expander to the chest wall with 2-0 PDS.  The drain was placed at the inframammary fold over the ADM and secured to the skin with 3-0 Silk.    The deep layers were closed with 3-0 PDS. The skin was closed with 4-0 Monocryl and then dermabond was applied.  The ABDs and breast binder were placed.  The patient tolerated the procedure well and there were no complications.  The patient was allowed to wake from anesthesia and taken to the recovery room in satisfactory condition.   The advanced practice practitioner (APP) assisted throughout the case.  The APP was essential in retraction and counter traction when needed to make the case progress smoothly.  This retraction and assistance made it possible to see the tissue plans for the procedure.  The assistance was needed for blood control, tissue re-approximation and assisted with closure of the incision site.

## 2023-09-05 NOTE — Progress Notes (Signed)
Nuclear med injection performed by Nuc Med staff.

## 2023-09-05 NOTE — Anesthesia Postprocedure Evaluation (Signed)
Anesthesia Post Note  Patient: Amy Pittman  Procedure(s) Performed: RIGHT MASTECTOMY WITH SENTINEL LYMPH NODE BIOPSY (Right: Breast) RIGHT BREAST RECONSTRUCTION WITH PLACEMENT OF TISSUE EXPANDER AND FLEX HD (ACELLULAR HYDRATED DERMIS) (Right: Breast)     Patient location during evaluation: PACU Anesthesia Type: Regional and General Level of consciousness: awake and alert Pain management: pain level controlled Vital Signs Assessment: post-procedure vital signs reviewed and stable Respiratory status: spontaneous breathing, nonlabored ventilation, respiratory function stable and patient connected to nasal cannula oxygen Cardiovascular status: blood pressure returned to baseline and stable Postop Assessment: no apparent nausea or vomiting Anesthetic complications: no   No notable events documented.  Last Vitals:  Vitals:   09/05/23 1452 09/05/23 1540  BP:    Pulse: 70 85  Resp:    Temp:    SpO2: 97% 96%    Last Pain:  Vitals:   09/05/23 1540  TempSrc:   PainSc: 4                  Atzel Mccambridge P Maniah Nading

## 2023-09-05 NOTE — H&P (Signed)
MRN: [id number] DOB: 08/28/1986 Subjective   Chief Complaint: Breast Cancer   History of Present Illness: Amy Pittman is a 37 y.o. female who is seen today for right breast cancer. The patient is a 37 year old white female who is about 2 years status post right breast lumpectomy and sentinel node biopsy for a [id number] right breast cancer that was ER and PR positive and HER2 negative with a Ki-67 of 60%. Her margins were clean and her nodes were negative. She tolerated the surgery well. She was treated with chemotherapy, radiation therapy, and tamoxifen. Since her last visit she had a recent mammogram that showed some new calcifications measuring 1.8 cm at the posterior edge of her old lumpectomy. The lymph nodes looked normal. 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  Review of Systems: A complete review of systems was obtained from the patient. I have reviewed this information and discussed as appropriate with the patient. See HPI as well for other ROS.  ROS   Medical History: Past Medical History:  Diagnosis Date  History of cancer   Patient Active Problem List  Diagnosis  Malignant neoplasm of upper-outer quadrant of right breast in female, estrogen receptor negative (CMS/HHS-HCC)   Past Surgical History:  Procedure Laterality Date  CHOLECYSTECTOMY  MASTECTOMY PARTIAL / LUMPECTOMY    No Known Allergies  Current Outpatient Medications on File Prior to Visit  Medication Sig Dispense Refill  levonorgestreL (MIRENA 52 MG) 20 mcg/24 hr (8 years) IUD Insert 1 each into the uterus once Follow package directions.  tamoxifen (NOLVADEX) 20 MG tablet Take 20 mg by mouth once daily  acetaminophen (TYLENOL) 500 MG tablet TAKE 2 TABLETS (1,000 MG TOTAL) BY MOUTH EVERY SIX HOURS AS NEEDED FOR MILD PAIN OR HEADACHE.  lidocaine-prilocaine (EMLA) cream APPLY TO AFFECTED  AREA ONCE AS DIRECTED   No current facility-administered medications on file prior to visit.   History reviewed. No pertinent family history.   Social History   Tobacco Use  Smoking Status Never  Smokeless Tobacco Never    Social History   Socioeconomic History  Marital status: Single  Tobacco Use  Smoking status: Never  Smokeless tobacco: Never  Substance and Sexual Activity  Alcohol use: Never  Drug use: Never   Objective:   Vitals:  PainSc: 0-No pain   There is no height or weight on file to calculate BMI.  Physical Exam Vitals reviewed.  Constitutional:  General: She is not in acute distress. Appearance: Normal appearance.  HENT:  Head: Normocephalic and atraumatic.  Right Ear: External ear normal.  Left Ear: External ear normal.  Nose: Nose normal.  Mouth/Throat:  Mouth: Mucous membranes are moist.  Pharynx: Oropharynx is clear.  Eyes:  General: No scleral icterus. Extraocular Movements: Extraocular movements intact.  Conjunctiva/sclera: Conjunctivae normal.  Pupils: Pupils are equal, round, and reactive to light.  Cardiovascular:  Rate and Rhythm: Normal rate and regular rhythm.  Pulses: Normal pulses.  Heart sounds: Normal heart sounds.  Pulmonary:  Effort: Pulmonary effort is normal. No respiratory distress.  Breath sounds: Normal breath sounds.  Abdominal:  General: Bowel sounds are normal.  Palpations: Abdomen is soft.  Tenderness: There is no abdominal tenderness.  Musculoskeletal:  General: No swelling, tenderness or deformity. Normal range of motion.  Cervical back: Normal range of motion and neck supple.  Skin: General: Skin is warm and dry.  Coloration: Skin  is not jaundiced.  Neurological:  General: No focal deficit present.  Mental Status: She is alert and oriented to person, place, and time.  Psychiatric:  Mood and Affect: Mood normal.  Behavior: Behavior normal.     Breast: There is no palpable mass in either breast. There  is no palpable axillary, supraclavicular, or cervical lymphadenopathy.  Labs, Imaging and Diagnostic Testing:  Assessment and Plan:  Diagnoses and all orders for this visit:  Malignant neoplasm of upper-outer quadrant of right breast in female, estrogen receptor negative (CMS/HHS-HCC)    The patient is about 2 years status post right breast lumpectomy for breast cancer. She tolerated the surgery well. She now has a 1.8 cm cancer in the upper outer quadrant of the right breast posterior to her previous lumpectomy. Given that she had previous radiation my recommendation would be for mastectomy. She is still a good candidate for sentinel node biopsy. She is interested in reconstruction and has met with Dr. Ulice Bold. I have discussed with her in detail the risks and benefits of the operation as well as some of the technical aspects and she understands and wishes to proceed. We will coordinate with plastics and work on surgical scheduling

## 2023-09-05 NOTE — Interval H&P Note (Signed)
History and Physical Interval Note:  09/05/2023 8:02 AM  Amy Pittman  has presented today for surgery, with the diagnosis of RIGHT BREAST CANCER.  The various methods of treatment have been discussed with the patient and family. After consideration of risks, benefits and other options for treatment, the patient has consented to  Procedure(s): RIGHT MASTECTOMY WITH SENTINEL LYMPH NODE BIOPSY (Right) RIGHT BREAST RECONSTRUCTION WITH PLACEMENT OF TISSUE EXPANDER AND FLEX HD (ACELLULAR HYDRATED DERMIS) (Right) as a surgical intervention.  The patient's history has been reviewed, patient examined, no change in status, stable for surgery.  I have reviewed the patient's chart and labs.  Questions were answered to the patient's satisfaction.     Chevis Pretty III

## 2023-09-05 NOTE — Anesthesia Preprocedure Evaluation (Signed)
Anesthesia Evaluation  Patient identified by MRN, date of birth, ID band Patient awake    Reviewed: Allergy & Precautions, NPO status , Patient's Chart, lab work & pertinent test results  Airway Mallampati: II  TM Distance: >3 FB Neck ROM: Full    Dental no notable dental hx.    Pulmonary neg pulmonary ROS   Pulmonary exam normal        Cardiovascular negative cardio ROS  Rhythm:Regular Rate:Normal     Neuro/Psych  Headaches, Seizures -,   negative psych ROS   GI/Hepatic negative GI ROS, Neg liver ROS,,,  Endo/Other    Renal/GU negative Renal ROS  negative genitourinary   Musculoskeletal Breast Ca   Abdominal Normal abdominal exam  (+)   Peds  Hematology negative hematology ROS (+)   Anesthesia Other Findings   Reproductive/Obstetrics                             Anesthesia Physical Anesthesia Plan  ASA: 2  Anesthesia Plan: General and Regional   Post-op Pain Management: Regional block*, Celebrex PO (pre-op)*, Tylenol PO (pre-op)* and Gabapentin PO (pre-op)*   Induction: Intravenous  PONV Risk Score and Plan: 3 and Ondansetron, Dexamethasone, Midazolam and Treatment may vary due to age or medical condition  Airway Management Planned: Mask and Oral ETT  Additional Equipment: None  Intra-op Plan:   Post-operative Plan: Extubation in OR  Informed Consent: I have reviewed the patients History and Physical, chart, labs and discussed the procedure including the risks, benefits and alternatives for the proposed anesthesia with the patient or authorized representative who has indicated his/her understanding and acceptance.     Dental advisory given  Plan Discussed with: CRNA  Anesthesia Plan Comments:        Anesthesia Quick Evaluation

## 2023-09-06 ENCOUNTER — Encounter (HOSPITAL_BASED_OUTPATIENT_CLINIC_OR_DEPARTMENT_OTHER): Payer: Self-pay | Admitting: General Surgery

## 2023-09-06 DIAGNOSIS — Z170 Estrogen receptor positive status [ER+]: Secondary | ICD-10-CM | POA: Diagnosis not present

## 2023-09-06 DIAGNOSIS — C50211 Malignant neoplasm of upper-inner quadrant of right female breast: Secondary | ICD-10-CM | POA: Diagnosis not present

## 2023-09-06 NOTE — Discharge Summary (Signed)
Physician Discharge Summary  Patient ID: Amy Pittman MRN: [id number] DOB/AGE: [birth year] 37 y.o.  Admit date: 09/05/2023 Discharge date: 09/06/2023  Admission Diagnoses:  Discharge Diagnoses:  Principal Problem:   Breast cancer [facility])   Discharged Condition: Patient is a pleasant 37 year old female with PMH of right-sided breast cancer s/p ipsilateral mastectomy and deep right axillary sentinel lymph node biopsy with immediate reconstruction using tissue expander and Flex HD performed 09/05/2023 by Dr. Ulice Pittman and Dr. Carolynne Pittman who was admitted overnight for observation.  She is accompanied by her mother at bedside, resting comfortably.  She required a single Tylenol overnight for pain control.  Output has been minimal, drain intact and functional.  Ambulatory to the restroom, voiding.  Tolerating p.o. intake without difficulty, denies any ongoing nausea.  Feels prepared for discharge.  Discussed postoperative expectations.   Hospital Course: Admitted overnight for observation following right-sided mastectomy and immediate reconstruction using tissue expander and Flex HD.  200 cc was placed in expander at time of surgery.  Consults: None  Significant Diagnostic Studies: None  Treatments: surgery: right-sided mastectomy and deep right axillary sentinel lymph node biopsy with immediate reconstruction using tissue expander and Flex HD.  Discharge Exam: Blood pressure 120/76, pulse 83, temperature 98.2 F (36.8 C), resp. rate 16, height 5\' 5"  (1.651 m), weight 91.7 kg, SpO2 96%. General appearance: alert, cooperative, and no distress Breasts: Right side expander appropriately placed, no significant swelling or obvious subcutaneous fluid collections.  No hematoma or seroma.  No overlying skin changes.  Bordered Mepilex dressing intact, no obvious drainage.  Drain intact and functional, remains well secured with suture.  Normal-appearing serosanguineous output in the bulb and  tube. Extremities: No lower extremity swelling or edema.  Disposition: Discharge disposition: 01-Home or Self Care       Discharge Instructions     Diet - low sodium heart healthy   Complete by: As directed    Increase activity slowly   Complete by: As directed       Allergies as of 09/06/2023   No Known Allergies      Medication List     TAKE these medications    diazepam 2 MG tablet Commonly known as: Valium Take 1 tablet (2 mg total) by mouth every 12 (twelve) hours as needed for up to 20 doses for muscle spasms.   Mirena (52 MG) 20 MCG/24HR Iud Generic drug: levonorgestrel 1 Intra Uterine Device by Intrauterine route once. Every 7 years   ondansetron 4 MG tablet Commonly known as: Zofran Take 1 tablet (4 mg total) by mouth every 8 (eight) hours as needed for up to 20 doses for nausea or vomiting.   oxyCODONE 5 MG immediate release tablet Commonly known as: Roxicodone Take 1 tablet (5 mg total) by mouth every 6 (six) hours as needed for up to 20 doses for severe pain.        Follow-up Information     Dillingham, Alena Bills, DO Follow up in 10 day(s).   Specialty: Plastic Surgery Contact information: [address] [place] [address] [phone]                 [facility] [address] [place], [place] [address] [phone]  Signed: Evelena Pittman 09/06/2023, 7:27 AM

## 2023-09-12 ENCOUNTER — Ambulatory Visit (INDEPENDENT_AMBULATORY_CARE_PROVIDER_SITE_OTHER): Payer: Commercial Managed Care - PPO | Admitting: Student

## 2023-09-12 ENCOUNTER — Encounter: Payer: Self-pay | Admitting: Student

## 2023-09-12 ENCOUNTER — Encounter: Payer: Self-pay | Admitting: *Deleted

## 2023-09-12 VITALS — BP 117/80 | HR 91

## 2023-09-12 DIAGNOSIS — Z170 Estrogen receptor positive status [ER+]: Secondary | ICD-10-CM

## 2023-09-12 DIAGNOSIS — Z9889 Other specified postprocedural states: Secondary | ICD-10-CM

## 2023-09-12 LAB — SURGICAL PATHOLOGY

## 2023-09-12 NOTE — Progress Notes (Signed)
Patient is a 37 year old female with history of right breast cancer.  She underwent right mastectomy with deep right axillary sentinel lymph node biopsy with Dr. Carolynne Edouard followed by right immediate breast reconstruction with placement of acellular dermal matrix and tissue expanders with Dr. Ulice Bold on 09/05/2023.  Intraoperatively, patient had a Mentor 535 cc expander placed, and 200 cc of injectable saline was placed in the expander at the time of surgery.  Patient is 1 week postop.  Patient presents to the clinic today for postoperative follow-up.  Today, patient presents with mother at bedside.  Patient reports she is doing well.  She states that she is a little sore, more particularly in her axillary region, but otherwise does not have any issues or complaints.  She states that her drain output has been approximately 25 to 35 cc/day for the past few days.  Denies any fevers or chills.  Denies any nausea or vomiting.  States that she is eating and drinking without issue.  States that she is ambulating without issue.  Chaperone present on exam.  On exam, patient is sitting upright in no acute distress.  Right breast is soft, expander in place.  There is some mild ecchymosis noted over the right breast.  No overlying erythema.  No fluid collections on exam.  Mepilex border dressing is clean dry and intact.  This was removed.  Incision appears to be intact with Steri-Strips.  There is no obvious drainage or surrounding erythema on exam.  No signs of infection on exam.  Right JP drain is in place with serosanguineous drainage in the bulb.  Discussed with patient that I recommend she do some light range of motion with her right upper extremity to avoid any scarring down of her right upper extremity.  Discussed with her to avoid any heavy activities or vigorous activities.  Discussed with her she should continue compression at all times.  Patient expressed understanding.  Discussed with patient that she can take  Tylenol or ibuprofen for her pain.  Discussed that if she is having a tight feeling, she can take the Valium.  Patient expressed understanding.  Patient to follow back up next week with possible drain removal and for possible expander fill.  Instructed the patient to call in the meantime she has any questions or concerns about anything.

## 2023-09-16 ENCOUNTER — Other Ambulatory Visit: Payer: Self-pay

## 2023-09-16 ENCOUNTER — Inpatient Hospital Stay: Payer: Commercial Managed Care - PPO | Attending: Hematology and Oncology | Admitting: Hematology and Oncology

## 2023-09-16 ENCOUNTER — Encounter: Payer: Self-pay | Admitting: *Deleted

## 2023-09-16 VITALS — BP 109/64 | HR 79 | Temp 97.9°F | Resp 18 | Ht 65.0 in | Wt 200.0 lb

## 2023-09-16 DIAGNOSIS — Z1732 Human epidermal growth factor receptor 2 negative status: Secondary | ICD-10-CM | POA: Insufficient documentation

## 2023-09-16 DIAGNOSIS — Z923 Personal history of irradiation: Secondary | ICD-10-CM | POA: Diagnosis not present

## 2023-09-16 DIAGNOSIS — Z9011 Acquired absence of right breast and nipple: Secondary | ICD-10-CM | POA: Diagnosis not present

## 2023-09-16 DIAGNOSIS — Z1721 Progesterone receptor positive status: Secondary | ICD-10-CM | POA: Insufficient documentation

## 2023-09-16 DIAGNOSIS — Z170 Estrogen receptor positive status [ER+]: Secondary | ICD-10-CM | POA: Insufficient documentation

## 2023-09-16 DIAGNOSIS — Z79810 Long term (current) use of selective estrogen receptor modulators (SERMs): Secondary | ICD-10-CM | POA: Insufficient documentation

## 2023-09-16 DIAGNOSIS — C50211 Malignant neoplasm of upper-inner quadrant of right female breast: Secondary | ICD-10-CM | POA: Insufficient documentation

## 2023-09-16 NOTE — Progress Notes (Signed)
Patient Care Team: Myrlene Broker, MD as PCP - General (Internal Medicine) Gean Birchwood, MD (Orthopedic Surgery) Serena Croissant, MD as Consulting Physician (Hematology and Oncology) Lonie Peak, MD as Attending Physician (Radiation Oncology) Griselda Miner, MD as Consulting Physician (General Surgery)  DIAGNOSIS:  Encounter Diagnosis  Name Primary?   Malignant neoplasm of upper-inner quadrant of right breast in female, estrogen receptor positive (HCC) Yes    SUMMARY OF ONCOLOGIC HISTORY: Oncology History  Malignant neoplasm of upper-inner quadrant of right breast in female, estrogen receptor positive (HCC)  04/30/2021 Genetic Testing   Negative. Variant of uncertain significance in TP53 (c.704A>G). Genes Tested include: BRCA1, BRCA2; APC, ATM, AXIN2, BAP1, BARD1, BMPR1A, BRIP1, CDH1, CDK4, CDKN2A, CHEK2, CTNNA1, FH, FLCN, HOXB13 (seq only), MEN1, MET, MLH1, MSH2, MSH3 (exlcuding repetitive portions of exon 1), MSH6, MUTYH, NTHL1, PALB2, PMS2, PTEN, RAD51C, RAD51D, SDHA, SDHB, SDHC, SDHD, SMAD4, STK11, TP53, TSC1, TSC2, VHL.   05/27/2021 Initial Diagnosis   Right breast biopsy 05/27/2021: Grade 3 IDC with DCIS ER 35%, PR 80%, Ki-67 60%, HER2 1+ negative,T2 N0 stage IIA Oncotype score: 30: High risk   06/02/2021 Cancer Staging   Staging form: Breast, AJCC 8th Edition - Clinical: Stage IIA (cT2, cN0, cM0, G3, ER+, PR+, HER2-) - Signed by Serena Croissant, MD on 06/02/2021 Stage prefix: Initial diagnosis Histologic grading system: 3 grade system   06/02/2021 - 06/16/2021 Anti-estrogen oral therapy   Neoadj tamoxifen   06/09/2021 Oncotype testing   Oncotype DX was obtained on the final surgical sample and the recurrence score of 30 predicts a risk of recurrence outside the breast over the next 9 years of 19%, if the patient's only systemic therapy is an antiestrogen for 5 years.  It also predicts a significant benefit from chemotherapy.   06/26/2021 - 08/27/2021 Chemotherapy    Patient  is on Treatment Plan: BREAST TC Q21D x4 cycles       10/16/2021 Definitive Surgery   FINAL MICROSCOPIC DIAGNOSIS:   A. LYMPH NODE, RIGHT AXILLARY, SENTINEL, EXCISION:  - 1 lymph node, negative for carcinoma (0/1).   B. LYMPH NODE, RIGHT AXILLARY, SENTINEL, EXCISION:  - 1 lymph node, negative for carcinoma (0/1).   C. LYMPH NODE, RIGHT AXILLARY, SENTINEL, EXCISION:  - 1 lymph node, negative for carcinoma (0/1).   D. LYMPH NODE, RIGHT AXILLARY, SENTINEL, EXCISION:  - 1 lymph node, negative for carcinoma (0/1).   E. BREAST, RIGHT, LUMPECTOMY:  - Residual invasive ductal carcinoma, 0.4 cm in maximal extent, grade 2.  - Invasive tumor comes to 0.5 cm of closest (lateral) resection margin.  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  - In situ carcinoma comes to 0.1 cm of closest (deep) margin.  - Biopsy site changes.    ADDENDUM:  PROGNOSTIC INDICATOR RESULTS:   By immunohistochemistry, HER-2 is EQUIVOCAL (2+).  HER-2 by FISH is  pending and will be reported in an addendum.   Estrogen Receptor: POSITIVE, 40% WEAK STAINING INTENSITY  Progesterone Receptor: POSITIVE, 20% MODERATE STAINING INTENSITY  Proliferation Marker Ki-67: <1%   ADDENDUM:  FLOURESCENCE IN-SITU HYBRIDIZATION RESULTS:  GROUP 5:   HER2 **NEGATIVE**    11/17/2021 - 12/15/2021 Radiation Therapy   Site Technique Total Dose (Gy) Dose per Fx (Gy) Completed Fx Beam Energies  Breast, Right: Breast_R 3D 40.05/40.05 2.67 15/15 10X, 6XFFF  Breast, Right: Breast_R_Bst 3D 10/10 2 5/5 6X     12/18/2021 -  Anti-estrogen oral therapy   Adjuvant tamoxifen   09/05/2023 Surgery   Right  mastectomy: Focus of grade 2 IDC 2.5 mm, margins negative, 0/1 lymph node negative, ER 0%, PR 90%, Ki-67 15%, HER2 negative 1+     CHIEF COMPLIANT: Follow-up after mastectomy    History of Present Illness   The patient, with a history of breast cancer, recently underwent a mastectomy. She reports minimal pain  post-surgery, having only taken one dose of oxycodone in the hospital. She is currently experiencing discomfort in her armpit, which is expected post-surgery. The patient also mentions a change in the color of her surgical drainage from blood red to a more watery yellow. The patient also expresses concern about future monitoring of her right breast, which now has an implant.         ALLERGIES:  has No Known Allergies.  MEDICATIONS:  Current Outpatient Medications  Medication Sig Dispense Refill   diazepam (VALIUM) 2 MG tablet Take 1 tablet (2 mg total) by mouth every 12 (twelve) hours as needed for up to 20 doses for muscle spasms. 20 tablet 0   levonorgestrel (MIRENA, 52 MG,) 20 MCG/24HR IUD 1 Intra Uterine Device by Intrauterine route once. Every 7 years     ondansetron (ZOFRAN) 4 MG tablet Take 1 tablet (4 mg total) by mouth every 8 (eight) hours as needed for up to 20 doses for nausea or vomiting. 20 tablet 0   oxyCODONE (ROXICODONE) 5 MG immediate release tablet Take 1 tablet (5 mg total) by mouth every 6 (six) hours as needed for up to 20 doses for severe pain. 20 tablet 0   No current facility-administered medications for this visit.    PHYSICAL EXAMINATION: ECOG PERFORMANCE STATUS: 1 - Symptomatic but completely ambulatory  Vitals:   09/16/23 0818  BP: 109/64  Pulse: 79  Resp: 18  Temp: 97.9 F (36.6 C)  SpO2: 100%   Filed Weights   09/16/23 0818  Weight: 200 lb (90.7 kg)     LABORATORY DATA:  I have reviewed the data as listed    Latest Ref Rng & Units 08/27/2021    8:29 AM 08/06/2021    9:55 AM 07/16/2021    9:56 AM  CMP  Glucose 70 - 99 mg/dL 161  91  97   BUN 6 - 20 mg/dL 7  11  14    Creatinine 0.44 - 1.00 mg/dL 0.96  0.45  4.09   Sodium 135 - 145 mmol/L 143  143  141   Potassium 3.5 - 5.1 mmol/L 3.7  3.4  3.5   Chloride 98 - 111 mmol/L 110  108  106   CO2 22 - 32 mmol/L 21  23  24    Calcium 8.9 - 10.3 mg/dL 9.7  9.4  9.1   Total Protein 6.5 - 8.1 g/dL 7.3   7.0  7.2   Total Bilirubin 0.3 - 1.2 mg/dL 0.3  0.4  0.4   Alkaline Phos 38 - 126 U/L 64  54  50   AST 15 - 41 U/L 12  15  14    ALT 0 - 44 U/L 15  18  22      Lab Results  Component Value Date   WBC 9.5 08/27/2021   HGB 11.2 (L) 08/27/2021   HCT 33.5 (L) 08/27/2021   MCV 90.3 08/27/2021   PLT 332 08/27/2021   NEUTROABS 7.8 (H) 08/27/2021    ASSESSMENT & PLAN:  Malignant neoplasm of upper-inner quadrant of right breast in female, estrogen receptor positive (HCC) 05/27/2021:Right breast biopsy 05/27/2021: 4 cm of grade 3  IDC with DCIS ER 35%, PR 80%, Ki-67 60%, HER2 1+ negative,T2 N0 stage IIA Oncotype score: 30: High risk Neoadjuvant chemo with Taxotere and Cytoxan x4 cycles completed 08/27/2021    10/16/2021:Right lumpectomy: Residual grade 2 IDC 0.4 cm, residual DCIS high-grade, margins negative, 0/4 lymph nodes negative, ER 40% weak staining, PR 20% moderate staining, Ki-67 less than 1%, HER2 equal vocal 2+ by IHC, FISH negative, RCB class I   Treatment plan: 1. Adjuvant radiation completed 12/15/21 2. we stopped Zoladex injections because the patient is contemplating on having children 3.  Current treatment: Tamoxifen 20 mg daily   Breast cancer surveillance: 1.  Breast exam 06/17/2023: Benign 2. Mammogram 05/27/2023: Indeterminate calcifications bilateral breasts, right breast: 1.8 cm Right breast biopsy 06/10/2023: Grade 2 IDC with DCIS: ER 0%, PR 90%, Ki-67 15%, HER2 0-1+ negative Left breast biopsy: Benign  09/05/2023: Right mastectomy: Focus of grade 2 IDC 2.5 mm, margins negative, 0/1 lymph node negative, ER 0%, PR 90%, Ki-67 15%, HER2 negative 1+  Patient will come back in March to see Mardella Layman and I will see her 6 months from that appointment. ------------------------------------- Assessment and Plan    Breast Cancer Recent mastectomy with pathology showing small focus of cancer (2.87mm, grade 2), negative margins, no lymphovascular invasion, and negative lymph nodes.  Hormone receptor status:ER negative, PR positive, HER2 negative. Patient recovering well post-operatively with minimal pain. -Continue follow-up with plastic surgery for reconstruction. -Initiate guardant reveal blood test every 3 months for early detection of recurrence. -Next follow-up with oncology in 6 months.  Benign Breast Calcifications Identified on left side during pre-operative workup. -Monitor with regular imaging as per standard care.  Post-operative Care Drainage from surgical site decreasing and changing to a more watery yellow color. -Continue current care and follow-up with plastic surgery in March 2025 for possible drain removal.        No orders of the defined types were placed in this encounter.  The patient has a good understanding of the overall plan. she agrees with it. she will call with any problems that may develop before the next visit here. Total time spent: 30 mins including face to face time and time spent for planning, charting and co-ordination of care   Tamsen Meek, MD 09/16/23

## 2023-09-16 NOTE — Progress Notes (Signed)
Per MD request RN successfully faxed Guardant Reveal orders to [phone].

## 2023-09-16 NOTE — Assessment & Plan Note (Addendum)
05/27/2021:Right breast biopsy 05/27/2021: 4 cm of grade 3 IDC with DCIS ER 35%, PR 80%, Ki-67 60%, HER2 1+ negative,T2 N0 stage IIA Oncotype score: 30: High risk Neoadjuvant chemo with Taxotere and Cytoxan x4 cycles completed 08/27/2021    10/16/2021:Right lumpectomy: Residual grade 2 IDC 0.4 cm, residual DCIS high-grade, margins negative, 0/4 lymph nodes negative, ER 40% weak staining, PR 20% moderate staining, Ki-67 less than 1%, HER2 equal vocal 2+ by IHC, FISH negative, RCB class I   Treatment plan: 1. Adjuvant radiation completed 12/15/21 2. we stopped Zoladex injections because the patient is contemplating on having children 3.  Current treatment: Tamoxifen 20 mg daily   Breast cancer surveillance: 1.  Breast exam 06/17/2023: Benign 2. Mammogram 05/27/2023: Indeterminate calcifications bilateral breasts, right breast: 1.8 cm Right breast biopsy 06/10/2023: Grade 2 IDC with DCIS: ER 0%, PR 90%, Ki-67 15%, HER2 0-1+ negative Left breast biopsy: Benign  09/05/2023: Right mastectomy: Focus of grade 2 IDC 2.5 mm, margins negative, 0/1 lymph node negative, ER 0%, PR 90%, Ki-67 15%, HER2 negative 1+  Patient will come back in March to see Mardella Layman and I will see her 6 months from that appointment.

## 2023-09-19 ENCOUNTER — Encounter: Payer: Self-pay | Admitting: Student

## 2023-09-19 ENCOUNTER — Ambulatory Visit (INDEPENDENT_AMBULATORY_CARE_PROVIDER_SITE_OTHER): Payer: Commercial Managed Care - PPO | Admitting: Student

## 2023-09-19 ENCOUNTER — Encounter: Payer: Self-pay | Admitting: *Deleted

## 2023-09-19 VITALS — BP 117/84 | HR 79 | Ht 65.0 in | Wt 200.0 lb

## 2023-09-19 DIAGNOSIS — Z170 Estrogen receptor positive status [ER+]: Secondary | ICD-10-CM

## 2023-09-19 DIAGNOSIS — C50211 Malignant neoplasm of upper-inner quadrant of right female breast: Secondary | ICD-10-CM

## 2023-09-19 NOTE — Progress Notes (Signed)
Patient is a 37 year old female with history of right breast cancer.  She underwent right mastectomy with deep right axillary sentinel lymph node biopsy with Dr. Carolynne Edouard followed by right immediate breast reconstruction with placement of acellular dermal matrix and tissue expanders with Dr. Ulice Bold on 09/05/2023.  Intraoperatively, patient had a Mentor 535 cc expander placed, and 200 cc of injectable saline was placed in the expander at the time of surgery.  Patient is 2 weeks postop.  Patient presents to the clinic today for postoperative follow-up.     Patient was last seen in the clinic on 09/12/2023.  At this visit, patient was doing well.  She was a little bit sore in her axillary region.  Her drain had been putting out approximately 25 to 35 cc/day.  On exam, breast was soft, expander in place.  There was some mild ecchymosis.  Right JP drain in place with serosanguineous drainage in place.  Today, patient reports she is doing well.  She presents with her mother at bedside.  Patient states that her drain has been putting out approximately 20 to 25 cc/day for the past few days.  She denies any fevers or chills.  Reports the pain in her axilla has slightly improved from previous exam and has been moving her arm a little bit more.  Patient states that she is interested in a fill today.  Chaperone present on exam.  On exam, patient is sitting upright in no acute distress.  Right breast is soft, expanders in place.  Incision is intact with Steri-Strips.  There is no active drainage or swelling.  There is no overlying erythema.  No obvious fluid collections on exam.  There is some what appears to be excess tissue/scarring noted to the breast, especially medially.  There are no signs of infection on exam.  There is minimal output in the JP drain, serosanguineous output.  JP drain was removed without any difficulty.  Patient tolerated well.  We placed injectable saline in the Expander using a sterile  technique: Right: 50 cc for a total of 250 cc/535 cc  Recommended patient massage medial breast daily.  Also recommended she continue with compression at all times.  Discussed with patient that she may be sore after today's fill, discussed she should take Tylenol and ibuprofen for pain, and Valium if needed.  Patient expressed understanding.  Discussed with patient to apply Vaseline and gauze over her drain site daily.  Patient to follow back up in 2 weeks.  Instructed the patient to call in the meantime she has any questions or concerns about anything.  Pictures were obtained of the patient and placed in the chart with the patient's or guardian's permission.

## 2023-09-20 ENCOUNTER — Encounter: Payer: Commercial Managed Care - PPO | Admitting: Student

## 2023-10-04 ENCOUNTER — Encounter: Payer: Self-pay | Admitting: Student

## 2023-10-04 ENCOUNTER — Ambulatory Visit (INDEPENDENT_AMBULATORY_CARE_PROVIDER_SITE_OTHER): Payer: Commercial Managed Care - PPO | Admitting: Student

## 2023-10-04 DIAGNOSIS — Z170 Estrogen receptor positive status [ER+]: Secondary | ICD-10-CM

## 2023-10-04 DIAGNOSIS — C50211 Malignant neoplasm of upper-inner quadrant of right female breast: Secondary | ICD-10-CM

## 2023-10-04 NOTE — Progress Notes (Signed)
Patient is a 37 year old female with history of right breast cancer. She underwent right mastectomy with deep right axillary sentinel lymph node biopsy with Dr. Carolynne Edouard followed by right immediate breast reconstruction with placement of acellular dermal matrix and tissue expanders with Dr. Ulice Bold on 09/05/2023.  Patient is 4 weeks postop.  She presents to the clinic today for postoperative follow-up.  Patient was last seen in the clinic on 09/19/2023.  At this visit, patient was doing well.  Her right breast expander was in place, incision was intact.  There were no signs of infection.  JP drain was removed.  50 cc of injectable saline was placed in the right expander for a total of 250 cc / 535 cc.  Today, patient reports she is doing really well.  She states that she feels her range of motion is improving, but is still somewhat limited.  She states that she has some tingling sensation over her breast.  Denies any other issues or concerns.  States that she would like another expander fill today.  Chaperone present on exam.  On exam, patient is sitting upright in no acute distress.  Right breast is soft, expander in place.  There is no overlying erythema.  No fluid collections on exam.  Some of the skin does appear to be scarring down, notably toward the inferior aspect of the breast.  Steri-Strips were removed from the incision.  Incision appears to be intact and healing well.  There is a little bit of firmness noted to the medial aspect of the breast, most likely consistent with some scar tissue.  No overlying skin changes.  We placed injectable saline in the Expander using a sterile technique: Right: 50 cc for a total of 300 cc / 535 cc  Encourage patient to continue to massage the medial aspect of her breast and in the inferior aspect of the breast where it appears the skin is scarred down.  Patient expressed understanding.  I did discuss with the patient that physical therapy might benefit her, she  wants to wait and see if she can improve her range of motion at home and wants to see if it is improved by the next visit.  She would like to hold off on it for now and further discuss at her next visit.  Patient to continue compression at all times and avoid vigorous activities.  We will see the patient back in 2 weeks.  Pictures were obtained of the patient and placed in the chart with the patient's or guardian's permission.

## 2023-10-07 DIAGNOSIS — C50911 Malignant neoplasm of unspecified site of right female breast: Secondary | ICD-10-CM | POA: Diagnosis not present

## 2023-10-19 ENCOUNTER — Encounter: Payer: Self-pay | Admitting: Student

## 2023-10-19 ENCOUNTER — Ambulatory Visit (INDEPENDENT_AMBULATORY_CARE_PROVIDER_SITE_OTHER): Payer: Commercial Managed Care - PPO | Admitting: Student

## 2023-10-19 VITALS — BP 99/68 | HR 83

## 2023-10-19 DIAGNOSIS — C50211 Malignant neoplasm of upper-inner quadrant of right female breast: Secondary | ICD-10-CM

## 2023-10-19 DIAGNOSIS — Z170 Estrogen receptor positive status [ER+]: Secondary | ICD-10-CM

## 2023-10-19 NOTE — Progress Notes (Signed)
Patient is a 37 year old female with history of right breast cancer. She underwent right mastectomy with deep right axillary sentinel lymph node biopsy with Dr. Carolynne Edouard followed by right immediate breast reconstruction with placement of acellular dermal matrix and tissue expanders with Dr. Ulice Bold on 09/05/2023.  Patient is 6 weeks postop.  She presents to the clinic today for postoperative follow-up.     Patient was last seen in the clinic on 10/04/2023.  At this visit, patient reported she was doing really well.  On exam, right breast with soft expander in place.  There is no fluid collections on exam.  Some of the skin did appear to be scarring down, notably toward the inferior aspect of the breast.  Incision was intact and healing well.  50 cc were injected into the expander for total of 300 cc / 535 cc.  Encourage patient to massage the medial aspect of her breast in the inferior aspect of her breast.  Plan is for patient to follow back up in 2 weeks.  Today, patient reports she is doing well.  She states that she has a little bit of soreness in the axilla, otherwise denies any concerns.  Denies any fevers or chills.  States she would like another expander fill today.  Chaperone present on exam.  On exam, patient is sitting upright in no acute distress.  Right breast is soft, expander in place.  There is no overlying erythema.  No obvious fluid collections on exam.  Some of the skin does appear to be scarred down near the superior aspect of the incision, slightly improved from previous exam.  Incision is clean dry and intact.  No signs of infection on exam.  We placed injectable saline in the Expander using a sterile technique: Right: 50 cc for a total of 350 cc/535 cc cc  Discussed with the patient that she may continue to massage the area that appears to be scarring down.  Discussed with her that she may start scar creams if she would like to her incision.  Patient expressed  understanding.  Discussed with her that her pain is most likely from lymph node dissection.  I did offer her physical therapy again, but she declined.  She knows that it is an option for her.  Discussed with patient that given she is 6 weeks out, she does not have to wear compression at all times anymore.  Discussed with her she may transition into a regular bra without underwire.  Discussed with her room she may start gradually increasing her activities.  Patient expressed understanding.  Patient to follow back up in another 2 weeks.  Instructed her to call in the meantime she has any questions or concerns about anything.

## 2023-10-21 ENCOUNTER — Telehealth: Payer: Self-pay

## 2023-10-21 NOTE — Telephone Encounter (Signed)
Called pt per Md left on Vm that guardant reveal results was negative. Pt knows to call back with any questions or concerns.

## 2023-10-24 ENCOUNTER — Encounter: Payer: Self-pay | Admitting: Hematology and Oncology

## 2023-11-02 ENCOUNTER — Encounter: Payer: Self-pay | Admitting: Student

## 2023-11-02 ENCOUNTER — Ambulatory Visit: Payer: Commercial Managed Care - PPO | Admitting: Student

## 2023-11-02 VITALS — BP 112/79 | HR 77 | Ht 65.0 in | Wt 200.0 lb

## 2023-11-02 DIAGNOSIS — C50211 Malignant neoplasm of upper-inner quadrant of right female breast: Secondary | ICD-10-CM

## 2023-11-02 DIAGNOSIS — Z170 Estrogen receptor positive status [ER+]: Secondary | ICD-10-CM

## 2023-11-02 NOTE — Progress Notes (Signed)
Patient is a 37 year old female with history of right breast cancer. She underwent right mastectomy with deep right axillary sentinel lymph node biopsy with Dr. Carolynne Edouard followed by right immediate breast reconstruction with placement of acellular dermal matrix and tissue expanders with Dr. Ulice Bold on 09/05/2023.  She is almost 2 months postop.  She presents to the clinic today for postoperative follow-up.  Patient was last seen in the clinic on 10/19/2023.  At this visit, patient was doing well.  We placed 50 cc of injectable saline in her right breast expander for total of 350 cc / 535 cc.  Today, patient is doing well.  She has no new complaints or concerns since previous visit.  Reports she has been massaging her right breast.  Denies any fevers or chills.  States she would like another fill today.  Chaperone present on exam.  On exam, patient is sitting upright in no acute distress.  Right breast is soft.  Expander in place.  There are no fluid collections palpated on exam.  Incision is well-healed.  There is no overlying erythema.  No signs of infection on exam.  Some of the skin inferiorly does appear to be scarred down.  We placed injectable saline in the Expander using a sterile technique: Right: 50 cc for a total of 400 cc/535 cc  We will plan to see the patient back in 2 weeks for another expander fill.  Recommended that she continue to massage scar down areas to her right breast.  Patient expressed understanding.  Instructed patient to call in the meantime she is any questions or concerns in the meantime.  Pictures were obtained of the patient and placed in the chart with the patient's or guardian's permission.

## 2023-11-14 ENCOUNTER — Ambulatory Visit: Payer: Commercial Managed Care - PPO | Attending: General Surgery

## 2023-11-14 VITALS — Wt 202.5 lb

## 2023-11-14 DIAGNOSIS — Z483 Aftercare following surgery for neoplasm: Secondary | ICD-10-CM | POA: Insufficient documentation

## 2023-11-14 NOTE — Therapy (Signed)
OUTPATIENT PHYSICAL THERAPY SOZO SCREENING NOTE   Patient Name: Amy Pittman MRN: [id number] DOB:29-Mar-1986, 37 y.o., female Today's Date: 11/14/2023  PCP: No primary care provider on file. REFERRING PROVIDER: Griselda Miner, MD   PT End of Session - 11/14/23 1627     Visit Number 2   # unchanged due to screen only   PT Start Time 1624    PT Stop Time 1628    PT Time Calculation (min) 4 min    Activity Tolerance Patient tolerated treatment well    Behavior During Therapy WFL for tasks assessed/performed             Past Medical History:  Diagnosis Date   Breast cancer (HCC)    Cancer (HCC)    Generalized headaches    Generalized seizures [facility]) neurologist--  per pt followed by [facility] neurolog[facility]   per pt dx 2010 and per pt none since ,did have work-up negative , felt she was going to sleep Abscent seizure  and triggers are stress and exhaustion   Menorrhagia    Personal history of chemotherapy    Personal history of radiation therapy    Wears glasses    Past Surgical History:  Procedure Laterality Date   ANKLE ARTHROSCOPY Right 07/09/2005     dr Turner Daniels  [facility]   w/ Debridement and removal loose body   BIOPSY  12/01/2020   Procedure: BIOPSY;  Surgeon: Lynann Bologna, MD;  Location: [facility] ENDOSCOPY;  Service: Endoscopy;;   BREAST BIOPSY Right 06/10/2023   MM RT BREAST BX W LOC DEV 1ST LESION IMAGE BX SPEC STEREO GUIDE 06/10/2023 [facility]   BREAST BIOPSY Left 06/10/2023   MM LT BREAST BX W LOC DEV 1ST LESION IMAGE BX SPEC STEREO GUIDE 06/10/2023 [facility]   BREAST LUMPECTOMY Right 10/16/2021   BREAST LUMPECTOMY WITH RADIOACTIVE SEED AND SENTINEL LYMPH NODE BIOPSY Right 10/16/2021   Procedure: RIGHT BREAST LUMPECTOMY WITH RADIOACTIVE SEED X2 AND SENTINEL LYMPH NODE BIOPSY;  Surgeon: Griselda Miner, MD;  Location: [facility];  Service: General;  Laterality: Right;   BREAST RECONSTRUCTION WITH PLACEMENT OF TISSUE EXPANDER AND FLEX HD  (ACELLULAR HYDRATED DERMIS) Right 09/05/2023   Procedure: RIGHT BREAST RECONSTRUCTION WITH PLACEMENT OF TISSUE EXPANDER AND FLEX HD (ACELLULAR HYDRATED DERMIS);  Surgeon: Peggye Form, DO;  Location: [facility];  Service: Plastics;  Laterality: Right;   CHOLECYSTECTOMY N/A 12/02/2020   Procedure: LAPAROSCOPIC CHOLECYSTECTOMY WITH CHOLANGIOGRAM;  Surgeon: Harriette Bouillon, MD;  [facility]: [facility];  Service: General;  Laterality: N/A;   DILATATION & CURETTAGE/HYSTEROSCOPY WITH MYOSURE N/A 11/24/2017   Procedure: DILATATION & CURETTAGE/HYSTEROSCOPY;  Surgeon: Carrington Clamp, MD;  Location: [facility];  Service: Gynecology;  Laterality: N/A;   ERCP N/A 12/01/2020   Procedure: ENDOSCOPIC RETROGRADE CHOLANGIOPANCREATOGRAPHY (ERCP);  Surgeon: Lynann Bologna, MD;  Location: [facility] ENDOSCOPY;  Service: Endoscopy;  Laterality: N/A;   IR IMAGING GUIDED PORT INSERTION  06/25/2021   IR REMOVAL TUN ACCESS W/ PORT W/O FL MOD SED  01/08/2022   MASTECTOMY W/ SENTINEL NODE BIOPSY Right 09/05/2023   Procedure: RIGHT MASTECTOMY WITH SENTINEL LYMPH NODE BIOPSY;  Surgeon: Griselda Miner, MD;  Location: [facility];  Service: General;  Laterality: Right;   REMOVAL OF STONES  12/01/2020   Procedure: REMOVAL OF STONES;  Surgeon: Lynann Bologna, MD;  Location: [facility] ENDOSCOPY;  Service: Endoscopy;;   SPHINCTEROTOMY  12/01/2020   Procedure: Dennison Mascot;  Surgeon: Lynann Bologna, MD;  Location: [facility] ENDOSCOPY;  Service: Endoscopy;;   Patient Active Problem List   Diagnosis Date Noted   Breast cancer (HCC) 09/05/2023   Genetic testing 08/06/2021   Malignant neoplasm of upper-inner quadrant of right breast in female, estrogen receptor positive (HCC) 06/02/2021   RUQ abdominal pain    Gallstones    Choledocholithiasis    Pancreatitis 11/29/2020   Cellulitis of left upper eyelid 10/11/2018   Low back pain 05/28/2015   Awareness alteration, transient 03/07/2015   Routine health  maintenance 05/10/2012   Generalized seizures (HCC)    Irregular menses     REFERRING DIAG: right breast cancer at risk for lymphedema  THERAPY DIAG: Aftercare following surgery for neoplasm  PERTINENT HISTORY: Patient reports she was diagnosed on 05/27/2021 with right grade III invasive ductal carcinoma breast cancer. She underwent neoadjuvant chemotherapy 06/26/2021 - 08/27/2021. Then she had a right lumpectomy and sentinel node biopsy (4 negative nodes) on 10/16/2021. It is ER/PR positive and HER2 negative with a Ki67 of 60%. She has a hx of a seizure disorder but no seizures since 2010  PRECAUTIONS: right UE Lymphedema risk, None  SUBJECTIVE: Pt returns for her 3 month L-Dex screen. "I had my mastectomy end of Sept and got an expander and everything seems to be going well. It was so small I didn't need chemo. "  PAIN:  Are you having pain? No  SOZO SCREENING: Patient was assessed today using the SOZO machine to determine the lymphedema index score. This was compared to her baseline score. It was determined that she is within the recommended range when compared to her baseline and no further action is needed at this time. She will continue SOZO screenings. These are done every 3 months for 2 years post operatively followed by every 6 months for 2 years, and then annually. Suggested pt speak with her plastic surgeon tomorrow about if she would like [place] to see her 3 weeks post op as we normally do. If so to send [place] a referral for this.    L-DEX FLOWSHEETS - 11/14/23 1600       L-DEX LYMPHEDEMA SCREENING   Measurement Type Unilateral    L-DEX MEASUREMENT EXTREMITY Upper Extremity    POSITION  Standing    DOMINANT SIDE Right    At Risk Side Right    BASELINE SCORE (UNILATERAL) -2.4    L-DEX SCORE (UNILATERAL) -3.3    VALUE CHANGE (UNILAT) -0.9               Hermenia Bers, PTA 11/14/2023, 4:30 PM

## 2023-11-15 ENCOUNTER — Encounter (HOSPITAL_COMMUNITY): Payer: Self-pay

## 2023-11-15 ENCOUNTER — Ambulatory Visit (INDEPENDENT_AMBULATORY_CARE_PROVIDER_SITE_OTHER): Payer: Commercial Managed Care - PPO

## 2023-11-15 ENCOUNTER — Ambulatory Visit (HOSPITAL_COMMUNITY)
Admission: EM | Admit: 2023-11-15 | Discharge: 2023-11-15 | Disposition: A | Discharge status: Home / Self Care | Payer: Commercial Managed Care - PPO | Attending: Family | Admitting: Family

## 2023-11-15 DIAGNOSIS — M79602 Pain in left arm: Secondary | ICD-10-CM | POA: Diagnosis not present

## 2023-11-15 DIAGNOSIS — M7989 Other specified soft tissue disorders: Secondary | ICD-10-CM | POA: Diagnosis not present

## 2023-11-15 DIAGNOSIS — S40022A Contusion of left upper arm, initial encounter: Secondary | ICD-10-CM

## 2023-11-15 DIAGNOSIS — M79652 Pain in left thigh: Secondary | ICD-10-CM

## 2023-11-15 DIAGNOSIS — M79632 Pain in left forearm: Secondary | ICD-10-CM
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
## 2023-11-15 NOTE — Discharge Instructions (Addendum)
Recommend take OTC Tylenol 1000mg  every 8 hours as needed for pain. Keep ace wrap on area during the day for support. May also apply cool compresses to area for comfort and alternate with warm compresses as needed. Will contact you later today with final X-ray results. Follow-up pending results.

## 2023-11-17 ENCOUNTER — Ambulatory Visit: Payer: Commercial Managed Care - PPO | Admitting: Student

## 2023-11-17 VITALS — BP 122/88 | HR 69 | Ht 65.0 in | Wt 202.8 lb

## 2023-11-17 DIAGNOSIS — C50911 Malignant neoplasm of unspecified site of right female breast: Secondary | ICD-10-CM | POA: Diagnosis not present

## 2023-11-17 DIAGNOSIS — Z170 Estrogen receptor positive status [ER+]: Secondary | ICD-10-CM

## 2023-11-17 DIAGNOSIS — C50211 Malignant neoplasm of upper-inner quadrant of right female breast: Secondary | ICD-10-CM

## 2023-11-17 NOTE — Progress Notes (Signed)
Patient is a 37 year old female with history of right breast cancer. She underwent right mastectomy with deep right axillary sentinel lymph node biopsy with Dr. Carolynne Edouard followed by right immediate breast reconstruction with placement of acellular dermal matrix and tissue expanders with Dr. Ulice Bold on 09/05/2023.  She is about 2 and half months postop.  She presents to the clinic today for postoperative follow-up.  Patient was last seen in the clinic on 11/02/2023.  At this visit, patient was doing well.  On exam, incision was well-healed.  No signs of infection on exam.  There was some scar down skin inferiorly.  50 cc were placed in the right expander for total of 400 cc / 535 cc.  Today, patient reports she is doing well.  She states that she has been massaging her right breast, but still has some scar down areas.  She denies any fevers or chills.  She reports that she would like another fill today.  She states that when she wears a bra, she still needs to wear some sort of padding or prosthetic on the right side to try and match her left side.  Chaperone present on exam.  On exam, patient is sitting upright in no acute distress.  Right breast expanders in place.  It is soft.  There is no overlying erythema, no fluid collections palpated on exam.  Incision is well-healed.  There are some areas of scarred down skin.  No signs of infection on exam.  We placed injectable saline in the Expander using a sterile technique: Right: 50 cc for a total of 450 cc / 535 cc  Recommended that patient continue to massage her right breast.  Suspect that she will need at least 1 more fill.  Will plan to have her come back in 2 weeks for another fill.  Instructed patient to call in the meantime she has any questions or concerns about anything.

## 2023-11-18 DIAGNOSIS — C50911 Malignant neoplasm of unspecified site of right female breast: Secondary | ICD-10-CM | POA: Diagnosis not present

## 2023-12-05 ENCOUNTER — Ambulatory Visit (INDEPENDENT_AMBULATORY_CARE_PROVIDER_SITE_OTHER): Payer: Commercial Managed Care - PPO | Admitting: Student

## 2023-12-05 DIAGNOSIS — C50211 Malignant neoplasm of upper-inner quadrant of right female breast: Secondary | ICD-10-CM

## 2023-12-05 DIAGNOSIS — Z170 Estrogen receptor positive status [ER+]: Secondary | ICD-10-CM

## 2023-12-05 NOTE — Progress Notes (Signed)
Patient is a 37 year old female with history of right breast cancer. She underwent right mastectomy with deep right axillary sentinel lymph node biopsy with Dr. Carolynne Edouard followed by right immediate breast reconstruction with placement of acellular dermal matrix and tissue expanders with Dr. Ulice Bold on 09/05/2023.  She is 3 months postop.  She presents to the clinic today for postoperative follow-up.   Patient was last seen in the clinic on 11/17/2023.  At this visit, patient was doing well.  On exam, right breast expander was in place, it was soft.  There was no overlying erythema.  A cc of injectable saline was placed in the right breast expander for total of 450 cc / 535 cc.  Today, patient reports she is doing well.  She states that the Saturday after her most recent visit, her breast became red.  She denied any increased pain, fevers or chills.  She states that after 1 day it went away and she has not had any issues since.  She denies any drainage from her incision.  She denies any current fevers or chills.  She does report some pain in her right axillary region, but unchanged from previous visits.  She states that she would like another fill today.  Chaperone present on exam.  On exam, patient is sitting upright in no acute distress.  Right breast expander is in place.  It is soft.  There is no overlying erythema.  No tenderness to palpation.  No obvious fluid collections on exam.  Incision is well-healed.  There is some tissue that is noted to be scarred down, does appear to be improved from previous exams.  There are no signs of infection on exam.  We placed injectable saline in the Expander using a sterile technique: Right: 50 cc for a total of 500 cc / 535 cc  Discussed with patient to continue to monitor her right breast closely.  Also discussed with her to continue massaging right breast as she has been.  Patient expressed understanding.  Will send referral to physical therapy for right  axillary pain.  Patient to follow-up with Dr. Ulice Bold in 2 to 3 weeks to discuss surgical planning.  Instructed patient to call in the meantime she has any questions or concerns about anything.  Pictures were obtained of the patient and placed in the chart with the patient's or guardian's permission.

## 2023-12-23 ENCOUNTER — Encounter: Payer: Self-pay | Admitting: Plastic Surgery

## 2023-12-23 ENCOUNTER — Ambulatory Visit (INDEPENDENT_AMBULATORY_CARE_PROVIDER_SITE_OTHER): Payer: Commercial Managed Care - PPO | Admitting: Plastic Surgery

## 2023-12-23 VITALS — BP 123/81 | HR 63 | Ht 65.0 in | Wt 200.0 lb

## 2023-12-23 DIAGNOSIS — C50211 Malignant neoplasm of upper-inner quadrant of right female breast: Secondary | ICD-10-CM

## 2023-12-23 DIAGNOSIS — Z170 Estrogen receptor positive status [ER+]: Secondary | ICD-10-CM

## 2023-12-23 NOTE — Progress Notes (Signed)
The patient is a 38 year old female here for follow-up after right breast reconstruction.  The patient underwent a right immediate breast reconstruction with acellular dermal matrix and a tissue expander on September 30.  She had 200 cc of saline placed in the expander.  As of today she has 500 cc and a 535 cc expander.  She is happy with the size and does not want to be any larger.  Pictures were obtained of the patient and placed in the chart with the patient's or guardian's permission.

## 2024-01-09 ENCOUNTER — Other Ambulatory Visit (HOSPITAL_COMMUNITY): Payer: Self-pay

## 2024-01-09 ENCOUNTER — Ambulatory Visit (INDEPENDENT_AMBULATORY_CARE_PROVIDER_SITE_OTHER): Payer: Commercial Managed Care - PPO | Admitting: Student

## 2024-01-09 VITALS — BP 111/76 | HR 78

## 2024-01-09 DIAGNOSIS — Z170 Estrogen receptor positive status [ER+]: Secondary | ICD-10-CM

## 2024-01-09 DIAGNOSIS — C50211 Malignant neoplasm of upper-inner quadrant of right female breast: Secondary | ICD-10-CM

## 2024-01-09 MED ORDER — CEPHALEXIN 500 MG PO CAPS
500.0000 mg | ORAL_CAPSULE | Freq: Four times a day (QID) | ORAL | 0 refills | Status: AC
  Filled 2024-01-09: qty 12, 3d supply, fill #0
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
## 2024-01-13 ENCOUNTER — Other Ambulatory Visit: Payer: Self-pay | Admitting: Plastic Surgery

## 2024-01-13 DIAGNOSIS — C50211 Malignant neoplasm of upper-inner quadrant of right female breast: Secondary | ICD-10-CM

## 2024-01-17 ENCOUNTER — Other Ambulatory Visit: Payer: Self-pay

## 2024-01-17 ENCOUNTER — Encounter (HOSPITAL_COMMUNITY): Payer: Self-pay

## 2024-01-17 ENCOUNTER — Encounter (HOSPITAL_COMMUNITY)
Admission: RE | Admit: 2024-01-17 | Discharge: 2024-01-17 | Disposition: A | Discharge status: Home / Self Care | Payer: Commercial Managed Care - PPO | Source: Ambulatory Visit | Attending: Plastic Surgery | Admitting: Plastic Surgery

## 2024-01-17 ENCOUNTER — Other Ambulatory Visit (HOSPITAL_COMMUNITY): Payer: Commercial Managed Care - PPO

## 2024-01-17 VITALS — BP 105/73 | HR 74 | Temp 98.4°F | Resp 18 | Ht 65.0 in | Wt 202.0 lb

## 2024-01-17 DIAGNOSIS — Z01812 Encounter for preprocedural laboratory examination: Secondary | ICD-10-CM | POA: Diagnosis not present

## 2024-01-17 DIAGNOSIS — Z01818 Encounter for other preprocedural examination: Secondary | ICD-10-CM

## 2024-01-17 LAB — CBC
HCT: 38.5 % (ref 36.0–46.0)
Hemoglobin: 13 g/dL (ref 12.0–15.0)
MCH: 29.7 pg (ref 26.0–34.0)
MCHC: 33.8 g/dL (ref 30.0–36.0)
MCV: 88.1 fL (ref 80.0–100.0)
Platelets: 364 10*3/uL (ref 150–400)
RBC: 4.37 MIL/uL (ref 3.87–5.11)
RDW: 13.1 % (ref 11.5–15.5)
WBC: 8.7 10*3/uL (ref 4.0–10.5)
nRBC: 0 % (ref 0.0–0.2)

## 2024-01-17 NOTE — Progress Notes (Signed)
PCP - Casimer Leek, NP   Cardiologist -   PPM/ICD - denies Device Orders - n/a Rep Notified - n/a  Chest x-ray -  EKG - 12-01-20 Stress Test -  ECHO -  Cardiac Cath -   Sleep Study - denies CPAP - n/a  DM -denies   Blood Thinner Instructions:denies Aspirin Instructions:n/a  ERAS Protcol -NPO   COVID TEST- no   Anesthesia review: no  Patient denies shortness of breath, fever, cough and chest pain at PAT appointment   All instructions explained to the patient, with a verbal understanding of the material. Patient agrees to go over the instructions while at home for a better understanding. Patient also instructed to self quarantine after being tested for COVID-19. The opportunity to ask questions was provided.

## 2024-01-18 NOTE — Anesthesia Preprocedure Evaluation (Signed)
Anesthesia Evaluation  Patient identified by MRN, date of birth, ID band Patient awake    Reviewed: Allergy & Precautions, NPO status , Patient's Chart, lab work & pertinent test results  Airway Mallampati: II  TM Distance: >3 FB Neck ROM: Full    Dental no notable dental hx.    Pulmonary neg pulmonary ROS   Pulmonary exam normal        Cardiovascular negative cardio ROS  Rhythm:Regular Rate:Normal     Neuro/Psych  Headaches, Seizures -,   negative psych ROS   GI/Hepatic negative GI ROS, Neg liver ROS,,,  Endo/Other    Renal/GU negative Renal ROS  negative genitourinary   Musculoskeletal Breast Ca   Abdominal Normal abdominal exam  (+)   Peds  Hematology negative hematology ROS (+)   Anesthesia Other Findings Breast cancer s/p chemo and radiation  Reproductive/Obstetrics                              Anesthesia Physical Anesthesia Plan  ASA: 2  Anesthesia Plan: General and Regional   Post-op Pain Management: Celebrex PO (pre-op)* and Tylenol PO (pre-op)*   Induction: Intravenous  PONV Risk Score and Plan: 3 and Ondansetron, Dexamethasone, Midazolam and Treatment may vary due to age or medical condition  Airway Management Planned: Oral ETT  Additional Equipment: None  Intra-op Plan:   Post-operative Plan: Extubation in OR  Informed Consent: I have reviewed the patients History and Physical, chart, labs and discussed the procedure including the risks, benefits and alternatives for the proposed anesthesia with the patient or authorized representative who has indicated his/her understanding and acceptance.     Dental advisory given  Plan Discussed with: CRNA  Anesthesia Plan Comments:         Anesthesia Quick Evaluation

## 2024-01-19 ENCOUNTER — Encounter (HOSPITAL_COMMUNITY)
Admission: RE | Disposition: A | Discharge status: Home / Self Care | Payer: Self-pay | Source: Home / Self Care | Attending: Plastic Surgery

## 2024-01-19 ENCOUNTER — Ambulatory Visit (HOSPITAL_COMMUNITY): Payer: Self-pay

## 2024-01-19 ENCOUNTER — Ambulatory Visit (HOSPITAL_BASED_OUTPATIENT_CLINIC_OR_DEPARTMENT_OTHER): Payer: Commercial Managed Care - PPO

## 2024-01-19 ENCOUNTER — Other Ambulatory Visit: Payer: Self-pay

## 2024-01-19 ENCOUNTER — Encounter (HOSPITAL_COMMUNITY): Payer: Self-pay | Admitting: Plastic Surgery

## 2024-01-19 ENCOUNTER — Ambulatory Visit (HOSPITAL_COMMUNITY)
Admission: RE | Admit: 2024-01-19 | Discharge: 2024-01-19 | Disposition: A | Discharge status: Home / Self Care | Payer: Commercial Managed Care - PPO | Attending: Plastic Surgery | Admitting: Plastic Surgery

## 2024-01-19 DIAGNOSIS — Z9011 Acquired absence of right breast and nipple: Secondary | ICD-10-CM

## 2024-01-19 DIAGNOSIS — Z853 Personal history of malignant neoplasm of breast: Secondary | ICD-10-CM | POA: Insufficient documentation

## 2024-01-19 DIAGNOSIS — Z421 Encounter for breast reconstruction following mastectomy: Secondary | ICD-10-CM

## 2024-01-19 DIAGNOSIS — N651 Disproportion of reconstructed breast: Secondary | ICD-10-CM | POA: Insufficient documentation

## 2024-01-19 DIAGNOSIS — Z923 Personal history of irradiation: Secondary | ICD-10-CM | POA: Insufficient documentation

## 2024-01-19 DIAGNOSIS — Z9221 Personal history of antineoplastic chemotherapy: Secondary | ICD-10-CM | POA: Diagnosis not present

## 2024-01-19 HISTORY — PX: REMOVAL OF TISSUE EXPANDER AND PLACEMENT OF IMPLANT: SHX6457

## 2024-01-19 HISTORY — PX: BREAST REDUCTION SURGERY: SHX8

## 2024-01-19 LAB — POCT PREGNANCY, URINE: Preg Test, Ur: NEGATIVE

## 2024-01-19 SURGERY — REMOVAL, TISSUE EXPANDER, BREAST, WITH IMPLANT INSERTION
Anesthesia: General | Site: Breast | Laterality: Right

## 2024-01-19 MED ORDER — CELECOXIB 200 MG PO CAPS
200.0000 mg | ORAL_CAPSULE | Freq: Once | ORAL | Status: AC
  Administered 2024-01-19: 200 mg via ORAL
  Filled 2024-01-19: qty 1

## 2024-01-19 MED ORDER — DEXAMETHASONE SODIUM PHOSPHATE 10 MG/ML IJ SOLN
INTRAMUSCULAR | Status: AC
  Filled 2024-01-19: qty 1

## 2024-01-19 MED ORDER — OXYCODONE HCL 5 MG PO TABS: 5.0000 mg | ORAL_TABLET | ORAL | Status: DC | PRN

## 2024-01-19 MED ORDER — ACETAMINOPHEN 10 MG/ML IV SOLN: 1000.0000 mg | Freq: Once | INTRAVENOUS | Status: DC | PRN

## 2024-01-19 MED ORDER — LACTATED RINGERS IV BOLUS
500.0000 mL | Freq: Once | INTRAVENOUS | Status: AC
  Administered 2024-01-19: 500 mL via INTRAVENOUS

## 2024-01-19 MED ORDER — SODIUM CHLORIDE 0.9 % IV SOLN: 250.0000 mL | INTRAVENOUS | Status: DC | PRN

## 2024-01-19 MED ORDER — SUGAMMADEX SODIUM 200 MG/2ML IV SOLN
INTRAVENOUS | Status: DC | PRN
  Administered 2024-01-19: 200 mg via INTRAVENOUS

## 2024-01-19 MED ORDER — VASHE WOUND IRRIGATION OPTIME
TOPICAL | Status: DC | PRN
  Administered 2024-01-19: 34 [oz_av] via TOPICAL

## 2024-01-19 MED ORDER — FENTANYL CITRATE (PF) 100 MCG/2ML IJ SOLN: 25.0000 ug | INTRAMUSCULAR | Status: DC | PRN

## 2024-01-19 MED ORDER — CEFAZOLIN SODIUM-DEXTROSE 2-4 GM/100ML-% IV SOLN
2.0000 g | INTRAVENOUS | Status: AC
  Administered 2024-01-19: 2 g via INTRAVENOUS
  Filled 2024-01-19: qty 100

## 2024-01-19 MED ORDER — HYDROMORPHONE HCL 1 MG/ML IJ SOLN
INTRAMUSCULAR | Status: DC | PRN
  Administered 2024-01-19: .5 mg via INTRAVENOUS

## 2024-01-19 MED ORDER — HEMOSTATIC AGENTS (NO CHARGE) OPTIME
TOPICAL | Status: DC | PRN
  Administered 2024-01-19: 1 via TOPICAL

## 2024-01-19 MED ORDER — BUPIVACAINE LIPOSOME 1.3 % IJ SUSP
INTRAMUSCULAR | Status: DC | PRN
Start: 2024-01-19 — End: 2024-01-19
  Administered 2024-01-19: 17 mL

## 2024-01-19 MED ORDER — PHENYLEPHRINE 80 MCG/ML (10ML) SYRINGE FOR IV PUSH (FOR BLOOD PRESSURE SUPPORT)
PREFILLED_SYRINGE | INTRAVENOUS | Status: DC | PRN
  Administered 2024-01-19: 80 ug via INTRAVENOUS

## 2024-01-19 MED ORDER — LACTATED RINGERS IV SOLN: INTRAVENOUS | Status: DC | PRN

## 2024-01-19 MED ORDER — MIDAZOLAM HCL 2 MG/2ML IJ SOLN
INTRAMUSCULAR | Status: AC
  Filled 2024-01-19: qty 2

## 2024-01-19 MED ORDER — HYDROMORPHONE HCL 1 MG/ML IJ SOLN
INTRAMUSCULAR | Status: AC
  Filled 2024-01-19: qty 0.5

## 2024-01-19 MED ORDER — PROPOFOL 10 MG/ML IV BOLUS
INTRAVENOUS | Status: AC
  Filled 2024-01-19: qty 20

## 2024-01-19 MED ORDER — LIDOCAINE 2% (20 MG/ML) 5 ML SYRINGE
INTRAMUSCULAR | Status: DC | PRN
Start: 2024-01-19 — End: 2024-01-19
  Administered 2024-01-19: 100 mg via INTRAVENOUS

## 2024-01-19 MED ORDER — BACITRACIN ZINC 500 UNIT/GM EX OINT
TOPICAL_OINTMENT | CUTANEOUS | Status: AC
  Filled 2024-01-19: qty 28.35

## 2024-01-19 MED ORDER — FENTANYL CITRATE (PF) 250 MCG/5ML IJ SOLN
INTRAMUSCULAR | Status: DC | PRN
  Administered 2024-01-19 (×5): 50 ug via INTRAVENOUS

## 2024-01-19 MED ORDER — CHLORHEXIDINE GLUCONATE 0.12 % MT SOLN
15.0000 mL | Freq: Once | OROMUCOSAL | Status: AC
  Administered 2024-01-19: 15 mL via OROMUCOSAL
  Filled 2024-01-19: qty 15

## 2024-01-19 MED ORDER — ACETAMINOPHEN 500 MG PO TABS
1000.0000 mg | ORAL_TABLET | Freq: Once | ORAL | Status: AC
  Administered 2024-01-19: 1000 mg via ORAL
  Filled 2024-01-19: qty 2

## 2024-01-19 MED ORDER — LACTATED RINGERS IV SOLN
Freq: Once | INTRAVENOUS | Status: DC
  Filled 2024-01-19: qty 30

## 2024-01-19 MED ORDER — CHLORHEXIDINE GLUCONATE CLOTH 2 % EX PADS: 6.0000 | MEDICATED_PAD | Freq: Once | CUTANEOUS | Status: DC

## 2024-01-19 MED ORDER — FENTANYL CITRATE (PF) 250 MCG/5ML IJ SOLN
INTRAMUSCULAR | Status: AC
  Filled 2024-01-19: qty 5

## 2024-01-19 MED ORDER — BUPIVACAINE LIPOSOME 1.3 % IJ SUSP
INTRAMUSCULAR | Status: AC
  Filled 2024-01-19: qty 20

## 2024-01-19 MED ORDER — ONDANSETRON HCL 4 MG/2ML IJ SOLN
INTRAMUSCULAR | Status: DC | PRN
  Administered 2024-01-19: 4 mg via INTRAVENOUS

## 2024-01-19 MED ORDER — ACETAMINOPHEN 325 MG PO TABS: 650.0000 mg | ORAL_TABLET | ORAL | Status: DC | PRN

## 2024-01-19 MED ORDER — ORAL CARE MOUTH RINSE: 15.0000 mL | Freq: Once | OROMUCOSAL | Status: AC

## 2024-01-19 MED ORDER — SODIUM CHLORIDE 0.9% FLUSH: 3.0000 mL | Freq: Two times a day (BID) | INTRAVENOUS | Status: DC

## 2024-01-19 MED ORDER — ONDANSETRON HCL 4 MG/2ML IJ SOLN
INTRAMUSCULAR | Status: AC
Start: 2024-01-19 — End: ?
  Filled 2024-01-19: qty 2

## 2024-01-19 MED ORDER — DROPERIDOL 2.5 MG/ML IJ SOLN: 0.6250 mg | Freq: Once | INTRAMUSCULAR | Status: DC | PRN

## 2024-01-19 MED ORDER — OXYCODONE HCL 5 MG/5ML PO SOLN: 5.0000 mg | Freq: Once | ORAL | Status: DC | PRN

## 2024-01-19 MED ORDER — BUPIVACAINE HCL (PF) 0.25 % IJ SOLN
INTRAMUSCULAR | Status: AC
  Filled 2024-01-19: qty 30

## 2024-01-19 MED ORDER — SODIUM CHLORIDE 0.9% FLUSH: 3.0000 mL | INTRAVENOUS | Status: DC | PRN

## 2024-01-19 MED ORDER — MIDAZOLAM HCL 2 MG/2ML IJ SOLN
INTRAMUSCULAR | Status: DC | PRN
  Administered 2024-01-19: 2 mg via INTRAVENOUS

## 2024-01-19 MED ORDER — LIDOCAINE-EPINEPHRINE 1 %-1:100000 IJ SOLN
INTRAMUSCULAR | Status: AC
  Filled 2024-01-19: qty 1

## 2024-01-19 MED ORDER — ACETAMINOPHEN 650 MG RE SUPP: 650.0000 mg | RECTAL | Status: DC | PRN

## 2024-01-19 MED ORDER — ROCURONIUM BROMIDE 10 MG/ML (PF) SYRINGE
PREFILLED_SYRINGE | INTRAVENOUS | Status: DC | PRN
Start: 2024-01-19 — End: 2024-01-19
  Administered 2024-01-19: 20 mg via INTRAVENOUS
  Administered 2024-01-19: 60 mg via INTRAVENOUS
  Administered 2024-01-19: 30 mg via INTRAVENOUS

## 2024-01-19 MED ORDER — OXYCODONE HCL 5 MG PO TABS: 5.0000 mg | ORAL_TABLET | Freq: Once | ORAL | Status: DC | PRN

## 2024-01-19 MED ORDER — LACTATED RINGERS IV SOLN
INTRAVENOUS | Status: DC | PRN
  Administered 2024-01-19: 1000 mL

## 2024-01-19 MED ORDER — LIDOCAINE-EPINEPHRINE 1 %-1:100000 IJ SOLN
INTRAMUSCULAR | Status: DC | PRN
  Administered 2024-01-19: 25 mL via INTRAMUSCULAR

## 2024-01-19 MED ORDER — 0.9 % SODIUM CHLORIDE (POUR BTL) OPTIME
TOPICAL | Status: DC | PRN
  Administered 2024-01-19: 1000 mL

## 2024-01-19 MED ORDER — PROPOFOL 10 MG/ML IV BOLUS
INTRAVENOUS | Status: DC | PRN
  Administered 2024-01-19: 150 mg via INTRAVENOUS

## 2024-01-19 MED ORDER — DEXAMETHASONE SODIUM PHOSPHATE 10 MG/ML IJ SOLN
INTRAMUSCULAR | Status: DC | PRN
Start: 2024-01-19 — End: 2024-01-19
  Administered 2024-01-19: 10 mg via INTRAVENOUS

## 2024-01-19 SURGICAL SUPPLY — 83 items
BAG COUNTER SPONGE SURGICOUNT (BAG) ×2 IMPLANT
BAG DECANTER FOR FLEXI CONT (MISCELLANEOUS) IMPLANT
BINDER BREAST LRG (GAUZE/BANDAGES/DRESSINGS) IMPLANT
BINDER BREAST MEDIUM (GAUZE/BANDAGES/DRESSINGS) IMPLANT
BINDER BREAST XLRG (GAUZE/BANDAGES/DRESSINGS) IMPLANT
BINDER BREAST XXLRG (GAUZE/BANDAGES/DRESSINGS) IMPLANT
BIOPATCH RED 1 DISK 7.0 (GAUZE/BANDAGES/DRESSINGS) ×4 IMPLANT
BLADE SURG 10 STRL SS (BLADE) ×2 IMPLANT
BNDG ELASTIC 6X5.8 VLCR STR LF (GAUZE/BANDAGES/DRESSINGS) IMPLANT
BNDG GAUZE DERMACEA FLUFF 4 (GAUZE/BANDAGES/DRESSINGS) IMPLANT
CANISTER SUCT 3000ML PPV (MISCELLANEOUS) ×2 IMPLANT
CHLORAPREP W/TINT 26 (MISCELLANEOUS) ×2 IMPLANT
CLEANSER WND VASHE 34 (WOUND CARE) ×2 IMPLANT
CLEANSER WND VASHE INSTL 34OZ (WOUND CARE) ×2 IMPLANT
COLLAGEN CELLERATERX 1 GRAM (Miscellaneous) IMPLANT
COVER SURGICAL LIGHT HANDLE (MISCELLANEOUS) ×2 IMPLANT
DERMABOND ADVANCED .7 DNX12 (GAUZE/BANDAGES/DRESSINGS) ×6 IMPLANT
DERMABOND ADVANCED .7 DNX6 (GAUZE/BANDAGES/DRESSINGS) IMPLANT
DRAIN CHANNEL 19F RND (DRAIN) IMPLANT
DRAPE CHEST BREAST 15X10 FENES (DRAPES) ×2 IMPLANT
DRAPE LAPAROSCOPIC ABDOMINAL (DRAPES) ×2 IMPLANT
DRAPE SURG ORHT 6 SPLT 77X108 (DRAPES) ×4 IMPLANT
DRSG MEPILEX POST OP 4X8 (GAUZE/BANDAGES/DRESSINGS) ×4 IMPLANT
DRSG TEGADERM 4X4.5 CHG (GAUZE/BANDAGES/DRESSINGS) IMPLANT
ELECT BLADE 4.0 EZ CLEAN MEGAD (MISCELLANEOUS) ×4 IMPLANT
ELECT CAUTERY BLADE 6.4 (BLADE) ×2 IMPLANT
ELECT REM PT RETURN 9FT ADLT (ELECTROSURGICAL) ×2 IMPLANT
ELECTRODE BLDE 4.0 EZ CLN MEGD (MISCELLANEOUS) ×4 IMPLANT
ELECTRODE REM PT RTRN 9FT ADLT (ELECTROSURGICAL) ×2 IMPLANT
EVACUATOR SILICONE 100CC (DRAIN) IMPLANT
FUNNEL KELLER 2 DISP (MISCELLANEOUS) ×2 IMPLANT
GAUZE PAD ABD 8X10 STRL (GAUZE/BANDAGES/DRESSINGS) ×4 IMPLANT
GAUZE SPONGE 4X4 12PLY STRL (GAUZE/BANDAGES/DRESSINGS) IMPLANT
GAUZE XEROFORM 5X9 LF (GAUZE/BANDAGES/DRESSINGS) IMPLANT
GLOVE BIO SURGEON STRL SZ 6.5 (GLOVE) ×4 IMPLANT
GLOVE SURG ENC TEXT LTX SZ7.5 (GLOVE) IMPLANT
GOWN STRL REUS W/ TWL LRG LVL3 (GOWN DISPOSABLE) ×6 IMPLANT
HEMOSTAT ARISTA ABSORB 3G PWDR (HEMOSTASIS) IMPLANT
IMPL BREAST HP 460CC (Breast) IMPLANT
IMPLANT BREAST HP 460CC (Breast) ×2 IMPLANT
KIT BASIN OR (CUSTOM PROCEDURE TRAY) ×2 IMPLANT
KIT FILL ASEPTIC TRANSFER (MISCELLANEOUS) IMPLANT
KIT TURNOVER KIT B (KITS) ×2 IMPLANT
MARKER SKIN DUAL TIP RULER LAB (MISCELLANEOUS) ×2 IMPLANT
NDL 18GX1X1/2 (RX/OR ONLY) (NEEDLE) IMPLANT
NDL HYPO 25GX1X1/2 BEV (NEEDLE) ×2 IMPLANT
NDL SPNL 18GX3.5 QUINCKE PK (NEEDLE) IMPLANT
NEEDLE 18GX1X1/2 (RX/OR ONLY) (NEEDLE) ×2 IMPLANT
NEEDLE HYPO 25GX1X1/2 BEV (NEEDLE) ×2 IMPLANT
NEEDLE SPNL 18GX3.5 QUINCKE PK (NEEDLE) ×2 IMPLANT
NS IRRIG 1000ML POUR BTL (IV SOLUTION) ×2 IMPLANT
PACK GENERAL/GYN (CUSTOM PROCEDURE TRAY) ×2 IMPLANT
PAD ABD 8X10 STRL (GAUZE/BANDAGES/DRESSINGS) IMPLANT
PAD ARMBOARD 7.5X6 YLW CONV (MISCELLANEOUS) ×4 IMPLANT
PAD FOAM SILICONE BACKED (GAUZE/BANDAGES/DRESSINGS) IMPLANT
POWDER SURGICEL 3.0 GRAM (HEMOSTASIS) IMPLANT
SIZER BREAST SGL 500CC (SIZER) IMPLANT
SPIKE FLUID TRANSFER (MISCELLANEOUS) IMPLANT
SPONGE T-LAP 18X18 ~~LOC~~+RFID (SPONGE) ×4 IMPLANT
STAPLER VISISTAT 35W (STAPLE) ×2 IMPLANT
STRIP CLOSURE SKIN 1/2X4 (GAUZE/BANDAGES/DRESSINGS) ×4 IMPLANT
SUT MNCRL AB 3-0 PS2 18 (SUTURE) ×8 IMPLANT
SUT MNCRL AB 3-0 PS2 27 (SUTURE) IMPLANT
SUT MNCRL AB 4-0 PS2 18 (SUTURE) IMPLANT
SUT MON AB 3-0 SH27 (SUTURE) IMPLANT
SUT MON AB 4-0 PS1 27 (SUTURE) IMPLANT
SUT MON AB 5-0 PS2 18 (SUTURE) IMPLANT
SUT PDS AB 2-0 CT2 27 (SUTURE) IMPLANT
SUT PDS AB 3-0 SH 27 (SUTURE) IMPLANT
SUT PROLENE 3 0 PS 2 (SUTURE) ×4 IMPLANT
SUT SILK 3 0 PS 1 (SUTURE) IMPLANT
SUT SILK 3 0 SH 30 (SUTURE) IMPLANT
SUT SILK 4 0 PS 2 (SUTURE) IMPLANT
SUT VIC AB 3-0 SH 18 (SUTURE) ×4 IMPLANT
SUT VIC AB 3-0 SH 27X BRD (SUTURE) IMPLANT
SUT VICRYL 4-0 PS2 18IN ABS (SUTURE) IMPLANT
SYR 50ML LL SCALE MARK (SYRINGE) IMPLANT
SYR CONTROL 10ML LL (SYRINGE) ×2 IMPLANT
TOWEL GREEN STERILE (TOWEL DISPOSABLE) ×2 IMPLANT
TOWEL GREEN STERILE FF (TOWEL DISPOSABLE) ×4 IMPLANT
TUBING INFILTRATION IT-10001 (TUBING) ×2 IMPLANT
TUBING SET GRADUATE ASPIR 12FT (MISCELLANEOUS) IMPLANT
WATER STERILE IRR 1000ML POUR (IV SOLUTION) IMPLANT

## 2024-01-19 NOTE — Op Note (Signed)
Op note:    DATE OF PROCEDURE: 01/19/2024  LOCATION: [facility]  SURGEON: Foster Simpson, DO  ASSISTANT: Caroline More, PA  PREOPERATIVE DIAGNOSIS 1. 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 Left breast asymmetry  POSTOPERATIVE DIAGNOSIS Same a preoperative diagnosis  PROCEDURES 1. Right breast expander removal and placement of saline implant 2.  Left breast reduction for symmetry. 550 g  COMPLICATIONS: None.  DRAINS: left breast drain  IMPLANTS: Mentor Smooth Round High Profile Saline. Ref #[id number].  Serial Number [id number], 460-550 cc filled to 550 cc  INDICATIONS FOR PROCEDURE Amy Pittman is a 38 y.o. year-old female born on Jun 24, 1986,with a history of breast cancer.  She was treated with a right mastectomy.  She now has asymmetry and is here for right breast reconstruction second procedure and left breast reduction.  MRN: [id number]  CONSENT Informed consent was obtained directly from the patient. The risks, benefits and alternatives were fully discussed. Specific risks including but not limited to bleeding, infection, hematoma, seroma, scarring, pain, nipple necrosis, asymmetry, poor cosmetic results, and need for further surgery were discussed. The patient's questions were answered.  DESCRIPTION OF PROCEDURE  Patient was brought into the operating room and rested on the operating room table in the supine position.  SCDs were placed and appropriate padding was performed.  Antibiotics were given. The patient underwent general anesthesia and the chest was prepped and draped in a sterile fashion.  A timeout was performed and all information was confirmed to be correct by those in the room.  Right: The patient was taken to the operating room. SCDs were placed and IV antibiotics were given. The patient's chest was prepped and draped in a sterile fashion. A time out was performed and the implants to be used were  identified.  Local with epinephrine was used to infiltrate the marked area.  The old mastectomy scar was opened and superior mastectomy and inferior mastectomy flaps were re-raised over the pectoralis major muscle. The pectoralis was split to expose the tissue expander which was removed. Inspection of the pocket showed a normal healthy capsule and good integration of the biologic matrix.   Inferior capsulotomies were performed to allow for breast pocket expansion.  Measurements were made to confirm adequate pocket size for the implant dimensions.  Hemostasis was ensured with electrocautery.  The pocket was irrigated with vashe. New gloves were placed.  The implant was placed in the pocket and oriented appropriately. The implant was filled to 550 cc with injectable saline. The pectoralis major muscle and capsule on the anterior surface were closed with a 3-0 PDS suture. The remaining skin was closed with 3-0 PDS deep dermal and 3-0 Monocryl subcuticular stitches.  Dermabond was applied.    Left side: Preoperative markings were confirmed.  Incision lines were injected with local containing epinephrine.  After waiting for vasoconstriction, the marked lines were incised with a #15 blade.  Tumescent was placed laterally. Liposuction was done manually to the lateral area and 100 cc was removed. A Wise-pattern superomedial breast reduction was performed by de-epithelializing the pedicle, using bovie to create the superomedial pedicle, and removing breast tissue from the superior, lateral, and inferior portions of the breast.  Care was taken to not undermine the breast pedicle. Hemostasis was achieved.  The nipple was gently rotated into position and the soft tissue was closed with 3-0 Monocryl.  The patient was sat upright and size and shape symmetry was confirmed.  The pocket was irrigated and  hemostasis confirmed.  The deep tissues were approximated with 3-0 PDS sutures. The skin was closed with deep dermal 3-0  Monocryl and subcuticular 4-0 Monocryl sutures.  Dermabond was applied.  A breast binder and ABDs were placed.  The nipple and skin flaps had good capillary refill at the end of the procedure.  The patient tolerated the procedure well. The patient was allowed to wake from anesthesia and taken to the recovery room in satisfactory condition.  The advanced practice practitioner (APP) assisted throughout the case.  The APP was essential in retraction and counter traction when needed to make the case progress smoothly.  This retraction and assistance made it possible to see the tissue plans for the procedure.  The assistance was needed for blood control, tissue re-approximation and assisted with closure of the incision site.

## 2024-01-19 NOTE — Interval H&P Note (Signed)
History and Physical Interval Note:  01/19/2024 6:45 AM  Amy Pittman  has presented today for surgery, with the diagnosis of history of breast cancer.  The various methods of treatment have been discussed with the patient and family. After consideration of risks, benefits and other options for treatment, the patient has consented to  Procedure(s): REMOVAL OF TISSUE EXPANDER AND PLACEMENT OF IMPLANT (Right) BREAST REDUCTION WITH LIPOSUCTION (Left) as a surgical intervention.  The patient's history has been reviewed, patient examined, no change in status, stable for surgery.  I have reviewed the patient's chart and labs.  Questions were answered to the patient's satisfaction.     Alena Bills Kreg Earhart

## 2024-01-19 NOTE — Anesthesia Postprocedure Evaluation (Signed)
Anesthesia Post Note  Patient: Amy Pittman  Procedure(s) Performed: REMOVAL OF TISSUE EXPANDER AND PLACEMENT OF IMPLANT (Right: Breast) BREAST REDUCTION WITH LIPOSUCTION (Left: Breast)     Patient location during evaluation: PACU Anesthesia Type: General Level of consciousness: awake and alert Pain management: pain level controlled Vital Signs Assessment: post-procedure vital signs reviewed and stable Respiratory status: spontaneous breathing, nonlabored ventilation, respiratory function stable and patient connected to nasal cannula oxygen Cardiovascular status: blood pressure returned to baseline and stable Postop Assessment: no apparent nausea or vomiting Anesthetic complications: no   No notable events documented.  Last Vitals:  Vitals:   01/19/24 1200 01/19/24 1215  BP: 119/65 126/75  Pulse: 64 68  Resp:    Temp:    SpO2: 95% 98%    Last Pain:  Vitals:   01/19/24 1143  PainSc: 0-No pain                 Emhouse Nation

## 2024-01-19 NOTE — Anesthesia Procedure Notes (Signed)
Procedure Name: Intubation Date/Time: 01/19/2024 7:43 AM  Performed by: Stanton Kidney, CRNAPre-anesthesia Checklist: Patient identified, Emergency Drugs available, Suction available and Patient being monitored Patient Re-evaluated:Patient Re-evaluated prior to induction Oxygen Delivery Method: Circle system utilized Preoxygenation: Pre-oxygenation with 100% oxygen Induction Type: IV induction Ventilation: Mask ventilation without difficulty Tube type: Oral Tube size: 7.0 mm Number of attempts: 1 Airway Equipment and Method: Stylet and Oral airway Placement Confirmation: ETT inserted through vocal cords under direct vision, positive ETCO2 and breath sounds checked- equal and bilateral Secured at: 22 cm Tube secured with: Tape Dental Injury: Teeth and Oropharynx as per pre-operative assessment

## 2024-01-19 NOTE — Transfer of Care (Signed)
Immediate Anesthesia Transfer of Care Note  Patient: Amy Pittman  Procedure(s) Performed: REMOVAL OF TISSUE EXPANDER AND PLACEMENT OF IMPLANT (Right: Breast) BREAST REDUCTION WITH LIPOSUCTION (Left: Breast)  Patient Location: PACU  Anesthesia Type:General  Level of Consciousness: awake, alert , and oriented  Airway & Oxygen Therapy: Patient Spontanous Breathing and Patient connected to face mask oxygen  Post-op Assessment: Report given to RN and Post -op Vital signs reviewed and stable  Post vital signs: Reviewed and stable  Last Vitals:  Vitals Value Taken Time  BP 122/61 01/19/24 1020  Temp    Pulse 83 01/19/24 1025  Resp 17 01/19/24 1025  SpO2 99 % 01/19/24 1025  Vitals shown include unfiled device data.  Last Pain:  Vitals:   01/19/24 0636  PainSc: 0-No pain         Complications: No notable events documented.

## 2024-01-19 NOTE — Discharge Instructions (Signed)

## 2024-01-20 ENCOUNTER — Encounter (HOSPITAL_COMMUNITY): Payer: Self-pay | Admitting: Plastic Surgery

## 2024-01-20 ENCOUNTER — Ambulatory Visit: Payer: Commercial Managed Care - PPO | Admitting: Physician Assistant

## 2024-01-20 DIAGNOSIS — Z170 Estrogen receptor positive status [ER+]: Secondary | ICD-10-CM

## 2024-01-20 DIAGNOSIS — C50211 Malignant neoplasm of upper-inner quadrant of right female breast: Secondary | ICD-10-CM

## 2024-01-20 LAB — SURGICAL PATHOLOGY

## 2024-01-20 NOTE — Progress Notes (Signed)
Patient is a pleasant 38 year old female with history of right breast cancer with unilateral mastectomy now s/p removal of right breast expander and placement of saline implant and left breast oncoplastic reduction for symmetry performed 01/19/2024 by Dr. Ulice Bold who joins via telephone for postoperative day 1 check-in.  Reviewed operative report and a 460 cc Mentor saline implant was filled to 550 cc at time of placement.  Approximately 550 g removed from the left breast, as well.  Today, patient is doing well.  Her husband and mother are assisting with her postoperative recovery.  She is ambulatory, voiding without difficulty.  Tolerating p.o. intake.  She has not required any narcotic analgesics.  Slightly more discomfort in the left breast than the right which is not unsurprising given that she had reduction on that side.  Drain output was approximately 60 cc yesterday after surgery.  She denies any leg swelling, chest pain, difficulty breathing, fevers, or other concerns.  Discussed ongoing activity modifications and compressive garments.  Recommending that she continue to sleep on her back.  She will call the office over the weekend should she have any questions or concerns.  Otherwise, plan for in person evaluation next week, as scheduled.

## 2024-01-25 ENCOUNTER — Ambulatory Visit: Payer: Commercial Managed Care - PPO | Admitting: Student

## 2024-01-25 ENCOUNTER — Encounter: Payer: Self-pay | Admitting: Student

## 2024-01-25 VITALS — BP 124/73 | HR 73

## 2024-01-25 DIAGNOSIS — Z170 Estrogen receptor positive status [ER+]: Secondary | ICD-10-CM

## 2024-01-25 DIAGNOSIS — C50211 Malignant neoplasm of upper-inner quadrant of right female breast: Secondary | ICD-10-CM

## 2024-01-25 NOTE — Progress Notes (Signed)
Patient is a 38 year old female with history of right breast cancer status post right breast reconstruction.  She most recently underwent removal of right breast tissue expander and placement of saline implant and left breast reduction for symmetry with Dr. Ulice Bold on 01/19/2024.  Patient is 6 days postop.  Intraoperatively, patient had a Mentor smooth round high-profile saline implant placed filled to 550 cc.  Patient presents to the clinic today for postoperative follow-up.  Today, patient reports she is doing well.  She states that her pain has been controlled with Tylenol only.  She states she has not had to take any narcotic medications.  She reports she has a little bit of bruising, especially to the left lateral breast.  She denies any fevers or chills.  Reports she has been eating and drinking without issue.  Patient states that her drain output has been 20 cc in the past 24 hours, then 25 cc in the 24 hours before that.  Chaperone present on exam.  On exam, patient is sitting upright in no acute distress.  Right breast implant is in place and is soft.  There is a little bit of ecchymosis noted to the inferior breast.  There is no overlying erythema.  No obvious fluid collections on exam.  Mepilex border dressing was clean dry and intact.  This was removed.  Incision is intact with Steri-Strips.  No active drainage on exam.  To the left breast, there is some overlying ecchymosis, most notably to the left lateral breast where liposuction was performed.  There is no overlying erythema.  No obvious fluid collections on exam.  NAC appears to be healthy.  Mepilex border dressing is clean dry and intact.  This was removed.  Incisions are intact with Steri-Strips.  There is no active drainage on exam.  No signs of infection on exam.  JP drain was intact and functioning to the left breast.  There is minimal clear serous drainage in the bulb.  JP drain was removed without any difficulty.  Patient tolerated  well.  Discussed with the patient that it appears she has some bruising, as expected though.  Discussed with her that this should improve with time.  Recommended that she continue to wear her compression at all times and avoid vigorous activities.  Patient expressed understanding.  Discussed with patient that her drain site may drain a little bit over the next few days.  Discussed with her she can apply gauze and tape over the area.  Discussed with her that she may start applying Vaseline to the drain site starting Friday or Saturday.  Patient expressed understanding.  Patient to follow-up at her scheduled appointment in 2 weeks.  I instructed her to call in the meantime she has any questions or concerns about anything.

## 2024-02-06 MED ORDER — KETAMINE HCL 10 MG/ML IJ SOLN
INTRAMUSCULAR | Status: DC | PRN
  Administered 2024-01-19 (×2): 20 mg via INTRAVENOUS

## 2024-02-06 NOTE — Addendum Note (Signed)
 Addendum  created 02/06/24 1531 by Stanton Kidney, CRNA   Intraprocedure Event edited, Intraprocedure Meds edited

## 2024-02-07 ENCOUNTER — Ambulatory Visit: Payer: Commercial Managed Care - PPO | Admitting: Plastic Surgery

## 2024-02-07 ENCOUNTER — Encounter: Payer: Self-pay | Admitting: Plastic Surgery

## 2024-02-07 DIAGNOSIS — Z170 Estrogen receptor positive status [ER+]: Secondary | ICD-10-CM

## 2024-02-07 DIAGNOSIS — C50211 Malignant neoplasm of upper-inner quadrant of right female breast: Secondary | ICD-10-CM

## 2024-02-07 NOTE — Progress Notes (Signed)
 The patient is a 38 year old female here for follow-up after under going breast reconstruction surgery on February 13.  She had right breast expander removal with placement of a saline implant and then a left breast reduction for symmetry.  She has a Dance movement psychotherapist smooth round high-profile saline implant in place with 550 cc.  Interestingly she had 550 cc removed from the left breast.  I replaced the Steri-Strips.  There is no sign of infection or seroma.  She is doing really well.  Minimal bruising as expected.  Pain level well-controlled most likely with the Exparel L.  We will plan to see her back in a week or 2 and she should continue with the sports bra.  Pictures were obtained of the patient and placed in the chart with the patient's or guardian's permission.

## 2024-02-13 ENCOUNTER — Ambulatory Visit: Payer: Commercial Managed Care - PPO | Attending: General Surgery

## 2024-02-13 VITALS — Wt 195.5 lb

## 2024-02-13 DIAGNOSIS — Z483 Aftercare following surgery for neoplasm: Secondary | ICD-10-CM | POA: Insufficient documentation

## 2024-02-13 NOTE — Therapy (Signed)
 OUTPATIENT PHYSICAL THERAPY SOZO SCREENING NOTE   Patient Name: Amy Pittman MRN: [id number] DOB:1986-01-14, 38 y.o., female Today's Date: 02/13/2024  PCP: No primary care provider on file. REFERRING PROVIDER: Griselda Miner, MD   PT End of Session - 02/13/24 1607     Visit Number 2   # unchanged due to screen only   PT Start Time 1605    PT Stop Time 1609    PT Time Calculation (min) 4 min    Activity Tolerance Patient tolerated treatment well    Behavior During Therapy WFL for tasks assessed/performed             Past Medical History:  Diagnosis Date   Breast cancer (HCC)    Cancer (HCC)    Generalized headaches    Generalized seizures [facility]) neurologist--  per pt followed by [place] neurologist   per pt dx 2010 and per pt none since ,did have work-up negative , felt she was going to sleep Abscent seizure  and triggers are stress and exhaustion   Menorrhagia    Personal history of chemotherapy    Personal history of radiation therapy    Wears glasses    Past Surgical History:  Procedure Laterality Date   ANKLE ARTHROSCOPY Right 07/09/2005     dr Turner Daniels  [facility]   w/ Debridement and removal loose body   BIOPSY  12/01/2020   Procedure: BIOPSY;  Surgeon: Lynann Bologna, MD;  Location[facility] ENDOSCOPY;  Service: Endoscopy;;   BREAST BIOPSY Right 06/10/2023   MM RT BREAST BX W LOC DEV 1ST LESION IMAGE BX SPEC STEREO GUIDE 06/10/2023 [facility]   BREAST BIOPSY Left 06/10/2023   MM LT BREAST BX W LOC DEV 1ST LESION IMAGE BX SPEC STEREO GUIDE 06/10/2023 [facility]   BREAST LUMPECTOMY Right 10/16/2021   BREAST LUMPECTOMY WITH RADIOACTIVE SEED AND SENTINEL LYMPH NODE BIOPSY Right 10/16/2021   Procedure: RIGHT BREAST LUMPECTOMY WITH RADIOACTIVE SEED X2 AND SENTINEL LYMPH NODE BIOPSY;  Surgeon: Griselda Miner, MD;  Location: [facility];  Service: General;  Laterality: Right;   BREAST RECONSTRUCTION WITH PLACEMENT OF TISSUE EXPANDER AND FLEX HD  (ACELLULAR HYDRATED DERMIS) Right 09/05/2023   Procedure: RIGHT BREAST RECONSTRUCTION WITH PLACEMENT OF TISSUE EXPANDER AND FLEX HD (ACELLULAR HYDRATED DERMIS);  Surgeon: Peggye Form, DO;  Location: [facility];  Service: Plastics;  Laterality: Right;   BREAST REDUCTION SURGERY Left 01/19/2024   Procedure: BREAST REDUCTION WITH LIPOSUCTION;  Surgeon: Peggye Form, DO;  Location: [facility] OR;  Service: Plastics;  Laterality: Left;   CHOLECYSTECTOMY N/A 12/02/2020   Procedure: LAPAROSCOPIC CHOLECYSTECTOMY WITH CHOLANGIOGRAM;  Surgeon: Harriette Bouillon, MD;  Location: [facility] OR;  Service: General;  Laterality: N/A;   DILATATION & CURETTAGE/HYSTEROSCOPY WITH MYOSURE N/A 11/24/2017   Procedure: DILATATION & CURETTAGE/HYSTEROSCOPY;  Surgeon: Carrington Clamp, MD;  Location: [facility];  Service: Gynecology;  Laterality: N/A;   ERCP N/A 12/01/2020   Procedure: ENDOSCOPIC RETROGRADE CHOLANGIOPANCREATOGRAPHY (ERCP);  Surgeon: Lynann Bologna, MD;  Location: [facility] ENDOSCOPY;  Service: Endoscopy;  Laterality: N/A;   IR IMAGING GUIDED PORT INSERTION  06/25/2021   IR REMOVAL TUN ACCESS W/ PORT W/O FL MOD SED  01/08/2022   MASTECTOMY W/ SENTINEL NODE BIOPSY Right 09/05/2023   Procedure: RIGHT MASTECTOMY WITH SENTINEL LYMPH NODE BIOPSY;  Surgeon: Griselda Miner, MD;  Location: [facility];  Service: General;  Laterality: Right;   REMOVAL OF STONES  12/01/2020   Procedure: REMOVAL OF STONES;  Surgeon: Lynann Bologna, MD;  Location: [facility] ENDOSCOPY;  Service: Endoscopy;;   REMOVAL OF TISSUE EXPANDER AND PLACEMENT OF IMPLANT Right 01/19/2024   Procedure: REMOVAL OF TISSUE EXPANDER AND PLACEMENT OF IMPLANT;  Surgeon: Peggye Form, DO;  Location: [facility] OR;  Service: Plastics;  Laterality: Right;   SPHINCTEROTOMY  12/01/2020   Procedure: SPHINCTEROTOMY;  Surgeon: Lynann Bologna, MD;  Location: [facility] ENDOSCOPY;  Service: Endoscopy;;   Patient Active Problem List   Diagnosis  Date Noted   Breast cancer (HCC) 09/05/2023   Genetic testing 08/06/2021   Malignant neoplasm of upper-inner quadrant of right breast in female, estrogen receptor positive (HCC) 06/02/2021   RUQ abdominal pain    Gallstones    Choledocholithiasis    Pancreatitis 11/29/2020   Cellulitis of left upper eyelid 10/11/2018   Low back pain 05/28/2015   Awareness alteration, transient 03/07/2015   Routine health maintenance 05/10/2012   Generalized seizures (HCC)    Irregular menses     REFERRING DIAG: right breast cancer at risk for lymphedema  THERAPY DIAG: Aftercare following surgery for neoplasm  PERTINENT HISTORY: Patient had a Rt mastectomy with another SLNB on 09/05/23; Patient reports she was diagnosed on 05/27/2021 with right grade III invasive ductal carcinoma breast cancer. She underwent neoadjuvant chemotherapy 06/26/2021 - 08/27/2021. Then she had a right lumpectomy and sentinel node biopsy (4 negative nodes) on 10/16/2021. It is ER/PR positive and HER2 negative with a Ki67 of 60%. She has a hx of a seizure disorder but no seizures since 2010  PRECAUTIONS: right UE Lymphedema risk, None  SUBJECTIVE: Pt returns for her 3 month L-Dex screen.   PAIN:  Are you having pain? No  SOZO SCREENING: Patient was assessed today using the SOZO machine to determine the lymphedema index score. This was compared to her baseline score. It was determined that she is within the recommended range when compared to her baseline and no further action is needed at this time. She will continue SOZO screenings. These are done every 3 months for 2 years post operatively followed by every 6 months for 2 years, and then annually. Suggested pt speak with her plastic surgeon tomorrow about if she would like [place] to see her 3 weeks post op as we normally do. If so to send [place] a referral for this.    L-DEX FLOWSHEETS - 02/13/24 1600       L-DEX LYMPHEDEMA SCREENING   Measurement Type Unilateral    L-DEX  MEASUREMENT EXTREMITY Upper Extremity    POSITION  Standing    DOMINANT SIDE Right    At Risk Side Right    BASELINE SCORE (UNILATERAL) -2.4    L-DEX SCORE (UNILATERAL) -1.7    VALUE CHANGE (UNILAT) 0.7            P: Start pts screening over from most recent surgery on 9/30 due to more lymph nodes removed then, so every 3 month x 2 years until ~09/04/2025.    Hermenia Bers, PTA 02/13/2024, 4:08 PM

## 2024-02-21 ENCOUNTER — Ambulatory Visit: Payer: Commercial Managed Care - PPO | Admitting: Student

## 2024-02-21 ENCOUNTER — Telehealth: Payer: Self-pay | Admitting: Adult Health

## 2024-02-21 VITALS — BP 110/74 | HR 87

## 2024-02-21 DIAGNOSIS — C50211 Malignant neoplasm of upper-inner quadrant of right female breast: Secondary | ICD-10-CM

## 2024-02-21 DIAGNOSIS — Z170 Estrogen receptor positive status [ER+]: Secondary | ICD-10-CM

## 2024-02-21 NOTE — Progress Notes (Signed)
 Patient is a 38 year old female with history of right breast cancer status post right breast reconstruction.  She most recently underwent removal of right breast tissue expander and placement of saline implant and left breast reduction for symmetry with Dr. Ulice Bold on 01/19/2024.  She is a little over 4 weeks postop.  She presents to the clinic today for postoperative follow-up.     Patient was last seen in the clinic on 02/07/2024.  At this visit, there is no sign of infection or seroma.  Patient was doing really well.  There is minimal bruising as expected.  Today, patient reports she is doing well.  She states that she sometimes has a little bit of tingling pain to her right breast, but it is intermittent and goes away.  She reports the bruising has been resolving to her left breast.  She denies any fevers or chills.  Denies any drainage from either breast.  Denies any other issues or concerns.  Chaperone present on exam.  On exam, patient is sitting upright in no acute distress.  Left breast is soft.  There is no overlying erythema.  No obvious fluid collections palpated on exam.  Left NAC is healthy.  There are Steri-Strips in place over the left breast incision.  These were removed.  Incision appears to be intact and healing well.  There were several Monocryl sutures that were cut and removed.  Patient tolerated well.  To the right breast, implant is in place.  There is no overlying erythema.  No obvious fluid collection on exam.  Incision is intact with Steri-Strips.  Steri-Strips were removed.  Incision appears to be intact and healing well.  Monocryl sutures were cut and removed.  Patient tolerated well.  There are no signs of infection on exam.  Recommended that patient apply Vaseline to her incisions daily.  Also recommended that she continue to wear her sports bra and avoid strenuous activities.  Patient to follow back up in 2 weeks.  Instructed her to call if she has any questions or  concerns about anything.

## 2024-02-21 NOTE — Telephone Encounter (Signed)
 Rescheduled appointment per provider PAL. Called and left VM with the changes made to the patients upcoming appointment.

## 2024-02-24 ENCOUNTER — Inpatient Hospital Stay: Payer: Commercial Managed Care - PPO | Admitting: Adult Health

## 2024-02-27 ENCOUNTER — Encounter: Payer: Self-pay | Admitting: Adult Health

## 2024-02-27 ENCOUNTER — Inpatient Hospital Stay: Attending: Adult Health | Admitting: Adult Health

## 2024-02-27 VITALS — BP 121/62 | HR 79 | Temp 98.5°F | Resp 18 | Ht 65.0 in | Wt 201.6 lb

## 2024-02-27 DIAGNOSIS — Z1231 Encounter for screening mammogram for malignant neoplasm of breast: Secondary | ICD-10-CM

## 2024-02-27 DIAGNOSIS — Z801 Family history of malignant neoplasm of trachea, bronchus and lung: Secondary | ICD-10-CM | POA: Insufficient documentation

## 2024-02-27 DIAGNOSIS — C50211 Malignant neoplasm of upper-inner quadrant of right female breast: Secondary | ICD-10-CM

## 2024-02-27 DIAGNOSIS — Z170 Estrogen receptor positive status [ER+]: Secondary | ICD-10-CM | POA: Diagnosis not present

## 2024-02-27 DIAGNOSIS — Z853 Personal history of malignant neoplasm of breast: Secondary | ICD-10-CM | POA: Insufficient documentation

## 2024-02-27 DIAGNOSIS — Z923 Personal history of irradiation: Secondary | ICD-10-CM | POA: Insufficient documentation

## 2024-02-27 DIAGNOSIS — Z9011 Acquired absence of right breast and nipple: Secondary | ICD-10-CM | POA: Insufficient documentation

## 2024-02-27 DIAGNOSIS — Z9221 Personal history of antineoplastic chemotherapy: Secondary | ICD-10-CM | POA: Diagnosis not present
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
## 2024-02-28 NOTE — Assessment & Plan Note (Signed)
 05/27/2021:Right breast biopsy 05/27/2021: 4 cm of grade 3 IDC with DCIS ER 35%, PR 80%, Ki-67 60%, HER2 1+ negative,T2 N0 stage IIA Oncotype score: 30: High risk Neoadjuvant chemo with Taxotere and Cytoxan x4 cycles completed 08/27/2021    10/16/2021:Right lumpectomy: Residual grade 2 IDC 0.4 cm, residual DCIS high-grade, margins negative, 0/4 lymph nodes negative, ER 40% weak staining, PR 20% moderate staining, Ki-67 less than 1%, HER2 equal vocal 2+ by IHC, FISH negative, RCB class I   Treatment plan: Adjuvant radiation completed 12/15/21 we stopped Zoladex injections because the patient is contemplating on having children Current treatment: Tamoxifen 20 mg daily-discontinued in 06-2023 with breast cancer recurrence Right breast biopsy 06/10/2023: Grade 2 IDC with DCIS: ER 0%, PR 90%, Ki-67 15%, HER2 0-1+ negative Left breast biopsy: Benign 09/05/2023: Right mastectomy: Focus of grade 2 IDC 2.5 mm, margins negative, 0/1 lymph node negative, ER 0%, PR 90%, Ki-67 15%, HER2 negative 1+ Breast cancer surveillance: No signs of local recurrence 1.  Breast exam 02/27/2024 benign 2.  Guardant Reveal testing every 6 months-negative in November 3.  Left breast diagnostic mammogram (calcification noted on most recent mammogram) due 07/2024 4.  Healthy diet and exercise (when cleared by surgery) recommended.  RTC in 6 months for f/u with Dr. Pamelia Hoit

## 2024-03-06 ENCOUNTER — Ambulatory Visit (INDEPENDENT_AMBULATORY_CARE_PROVIDER_SITE_OTHER): Admitting: Student

## 2024-03-06 ENCOUNTER — Encounter: Payer: Self-pay | Admitting: Student

## 2024-03-06 VITALS — BP 107/68 | HR 72

## 2024-03-06 DIAGNOSIS — C50211 Malignant neoplasm of upper-inner quadrant of right female breast: Secondary | ICD-10-CM

## 2024-03-06 DIAGNOSIS — Z170 Estrogen receptor positive status [ER+]: Secondary | ICD-10-CM

## 2024-03-06 NOTE — Progress Notes (Signed)
 Patient is a 38 year old female with history of right breast cancer status post right breast reconstruction.  She most recently underwent removal of right breast tissue expander and placement of saline implant and left breast reduction for symmetry with Dr. Ulice Bold on 01/19/2024.  Patient is a little over 6 weeks postop.  She presents to the clinic today for postoperative follow-up.          Patient was last seen in the clinic on 02/21/2024.  At this visit, patient reported she was doing well.  On exam, left breast was soft and left NAC was healthy.  Incision was intact and healing well.  To the right breast, implant was in place and incision was intact and healing well.  Today, patient reports she is doing well.  She reports that a small suture is poking out and bothering her a little bit to the right breast incision.  She denies any fevers or chills.  Denies any other issues or concerns at this time.  Chaperone present on exam.  On exam, patient is sitting upright in no acute distress.  Left breast is soft.  There is no overlying erythema.  No fluid collections palpated on exam.  NAC appears to be healthy.  Incisions are intact and well-healed.  To the right breast, implant is in place and incision is intact and well-healed.  There is a small suture protruding from the lateral aspect of the incision.  This was cut and removed.  Patient tolerated well.  There are no fluid collections palpated to the right breast.  There are no signs of infection on exam.  Discussed with patient she no longer has to wear compression at this time and she may change into a regular bra with no underwire.  Also discussed with patient she may start increasing her activities.  Patient expressed understanding.  We will have the patient follow back up in 1 year.  I instructed her to call in the meantime she has any questions or concerns about anything.  Patient states that she is not interested in nipple areola tattooing  at this time.  Pictures were obtained of the patient and placed in the chart with the patient's or guardian's permission.

## 2024-04-03 DIAGNOSIS — C50411 Malignant neoplasm of upper-outer quadrant of right female breast: Secondary | ICD-10-CM | POA: Diagnosis not present

## 2024-04-03 DIAGNOSIS — Z171 Estrogen receptor negative status [ER-]: Secondary | ICD-10-CM | POA: Diagnosis not present

## 2024-04-19 ENCOUNTER — Telehealth: Payer: Self-pay | Admitting: *Deleted

## 2024-04-19 NOTE — Telephone Encounter (Signed)
Per MD request RN placed call to pt with recent Guardant Reveal results being negative.  No answer, LVM for pt to return call to the office.

## 2024-04-24 ENCOUNTER — Encounter: Payer: Self-pay | Admitting: Hematology and Oncology

## 2024-05-14 ENCOUNTER — Ambulatory Visit: Attending: General Surgery

## 2024-05-14 VITALS — Wt 204.5 lb

## 2024-05-14 DIAGNOSIS — Z483 Aftercare following surgery for neoplasm: Secondary | ICD-10-CM | POA: Insufficient documentation

## 2024-05-14 NOTE — Therapy (Signed)
 OUTPATIENT PHYSICAL THERAPY SOZO SCREENING NOTE   Patient Name: Amy Pittman MRN: [id number] DOB:Jan 22, 1986, 38 y.o., female Today's Date: 05/14/2024  PCP: No primary care provider on file. REFERRING PROVIDER: Caralyn Chandler, MD   PT End of Session - 05/14/24 1550     Visit Number 2   # unchanged due to screen only   PT Start Time 1548    PT Stop Time 1552    PT Time Calculation (min) 4 min    Activity Tolerance Patient tolerated treatment well    Behavior During Therapy WFL for tasks assessed/performed             Past Medical History:  Diagnosis Date   Breast cancer (HCC)    Cancer (HCC)    Generalized headaches    Generalized seizures [facility]) neurologist--  per pt followed by [facility]   per pt dx 2010 and per pt none since ,did have work-up negative , felt she was going to sleep Abscent seizure  and triggers are stress and exhaustion   Menorrhagia    Personal history of chemotherapy    Personal history of radiation therapy    Wears glasses    Past Surgical History:  Procedure Laterality Date   ANKLE ARTHROSCOPY Right 07/09/2005     dr Carry Clapper  [facility]   w/ Debridement and removal loose body   BIOPSY  12/01/2020   Procedure: BIOPSY;  Surgeon: Lajuan Pila, MD;  Location: [facility] ENDOSCOPY;  Service: Endoscopy;;   BREAST BIOPSY Right 06/10/2023   MM RT BREAST BX W LOC DEV 1ST LESION IMAGE BX SPEC STEREO GUIDE 06/10/2023 [facility]AMMOGR[facility]   BREAST BIOPSY Left 06/10/2023   MM LT BREAST BX W LOC DEV 1ST LESION IMAGE BX SPEC STEREO GUIDE 06/10/2023 [facility]AMMO[facility]   BREAST LUMPECTOMY Right 10/16/2021   BREAST LUMPECTOMY WITH RADIOACTIVE SEED AND SENTINEL LYMPH NODE BIOPSY Right 10/16/2021   Procedure: RIGHT BREAST LUMPECTOMY WITH RADIOACTIVE SEED X2 AND SENTINEL LYMPH NODE BIOPSY;  Surgeon: Caralyn Chandler, MD;  Location: [facility];  Service: General;  Laterality: Right;   BREAST RECONSTRUCTION WITH PLACEMENT OF TISSUE EXPANDER AND FLEX HD  (ACELLULAR HYDRATED DERMIS) Right 09/05/2023   Procedure: RIGHT BREAST RECONSTRUCTION WITH PLACEMENT OF TISSUE EXPANDER AND FLEX HD (ACELLULAR HYDRATED DERMIS);  Surgeon: Thornell Flirt, DO;  Location: [facility];  Service: Plastics;  Laterality: Right;   BREAST REDUCTION SURGERY Left 01/19/2024   Procedure: BREAST REDUCTION WITH LIPOSUCTION;  Surgeon: Thornell Flirt, DO;  Location: [facility];  Service: Plastics;  Laterality: Left;   CHOLECYSTECTOMY N/A 12/02/2020   Procedure: LAPAROSCOPIC CHOLECYSTECTOMY WITH CHOLANGIOGRAM;  Surgeon: Sim Dryer, MD;  Location: [facility];  Service: General;  Laterality: N/A;   DILATATION & CURETTAGE/HYSTEROSCOPY WITH MYOSURE N/A 11/24/2017   Procedure: DILATATION & CURETTAGE/HYSTEROSCOPY;  Surgeon: Matt Song, MD;  Location: [facility];  Service: Gynecology;  Laterality: N/A;   ERCP N/A 12/01/2020   Procedure: ENDOSCOPIC RETROGRADE CHOLANGIOPANCREATOGRAPHY (ERCP);  Surgeon: Lajuan Pila, MD;  Location: [facility] ENDOSCOPY;  Service: Endoscopy;  Laterality: N/A;   IR IMAGING GUIDED PORT INSERTION  06/25/2021   IR REMOVAL TUN ACCESS W/ PORT W/O FL MOD SED  01/08/2022   MASTECTOMY W/ SENTINEL NODE BIOPSY Right 09/05/2023   Procedure: RIGHT MASTECTOMY WITH SENTINEL LYMPH NODE BIOPSY;  Surgeon: Caralyn Chandler, MD;  Location: [facility];  Service: General;  Laterality: Right;   REMOVAL OF STONES  12/01/2020   Procedure: REMOVAL OF STONES;  Surgeon: Lajuan Pila, MD;  Location: [facility] ENDOSCOPY;  Service: Endoscopy;;   REMOVAL OF TISSUE EXPANDER AND PLACEMENT OF IMPLANT Right 01/19/2024   Procedure: REMOVAL OF TISSUE EXPANDER AND PLACEMENT OF IMPLANT;  Surgeon: Thornell Flirt, DO;  Location[facility];  Service: Plastics;  Laterality: Right;   SPHINCTEROTOMY  12/01/2020   Procedure: SPHINCTEROTOMY;  Surgeon: Lajuan Pila, MD;  Location: [facility] ENDOSCOPY;  Service: Endoscopy;;   Patient Active Problem List   Diagnosis  Date Noted   Breast cancer (HCC) 09/05/2023   Genetic testing 08/06/2021   Malignant neoplasm of upper-inner quadrant of right breast in female, estrogen receptor positive (HCC) 06/02/2021   RUQ abdominal pain    Gallstones    Choledocholithiasis    Pancreatitis 11/29/2020   Cellulitis of left upper eyelid 10/11/2018   Low back pain 05/28/2015   Awareness alteration, transient 03/07/2015   Routine health maintenance 05/10/2012   Generalized seizures (HCC)    Irregular menses     REFERRING DIAG: right breast cancer at risk for lymphedema  THERAPY DIAG: Aftercare following surgery for neoplasm  PERTINENT HISTORY: Patient had a Rt mastectomy with another SLNB on 09/05/23; Patient reports she was diagnosed on 05/27/2021 with right grade III invasive ductal carcinoma breast cancer. She underwent neoadjuvant chemotherapy 06/26/2021 - 08/27/2021. Then she had a right lumpectomy and sentinel node biopsy (4 negative nodes) on 10/16/2021. It is ER/PR positive and HER2 negative with a Ki67 of 60%. She has a hx of a seizure disorder but no seizures since 2010  PRECAUTIONS: right UE Lymphedema risk, None  SUBJECTIVE: Pt returns for her 3 month L-Dex screen.   PAIN:  Are you having pain? No  SOZO SCREENING: Patient was assessed today using the SOZO machine to determine the lymphedema index score. This was compared to her baseline score. It was determined that she is within the recommended range when compared to her baseline and no further action is needed at this time. She will continue SOZO screenings. These are done every 3 months for 2 years post operatively followed by every 6 months for 2 years, and then annually.    L-DEX FLOWSHEETS - 05/14/24 1500       L-DEX LYMPHEDEMA SCREENING   Measurement Type Unilateral    L-DEX MEASUREMENT EXTREMITY Upper Extremity    POSITION  Standing    DOMINANT SIDE Right    At Risk Side Right    BASELINE SCORE (UNILATERAL) -2.4    L-DEX SCORE (UNILATERAL)  -0.7    VALUE CHANGE (UNILAT) 1.7            P: Start pts screening over from most recent surgery on 9/30 due to more lymph nodes removed then, so every 3 month x 2 years until ~09/04/2025.    Denyce Flank, PTA 05/14/2024, 3:51 PM

## 2024-07-10 DIAGNOSIS — Z01419 Encounter for gynecological examination (general) (routine) without abnormal findings: Secondary | ICD-10-CM | POA: Diagnosis not present

## 2024-07-31 ENCOUNTER — Encounter

## 2024-08-03 DIAGNOSIS — Z30432 Encounter for removal of intrauterine contraceptive device: Secondary | ICD-10-CM | POA: Diagnosis not present

## 2024-08-08 ENCOUNTER — Ambulatory Visit
Admission: RE | Admit: 2024-08-08 | Discharge: 2024-08-08 | Disposition: A | Discharge status: Home / Self Care | Source: Ambulatory Visit | Attending: Adult Health | Admitting: Adult Health

## 2024-08-08 DIAGNOSIS — R928 Other abnormal and inconclusive findings on diagnostic imaging of breast: Secondary | ICD-10-CM | POA: Diagnosis not present

## 2024-08-08 DIAGNOSIS — C50211 Malignant neoplasm of upper-inner quadrant of right female breast: Secondary | ICD-10-CM

## 2024-08-27 ENCOUNTER — Ambulatory Visit: Attending: General Surgery

## 2024-08-27 VITALS — Wt 199.0 lb

## 2024-08-27 DIAGNOSIS — Z483 Aftercare following surgery for neoplasm: Secondary | ICD-10-CM | POA: Insufficient documentation

## 2024-08-27 NOTE — Therapy (Signed)
 OUTPATIENT PHYSICAL THERAPY SOZO SCREENING NOTE   Patient Name: Amy Pittman MRN: [id number] DOB:15-Mar-1986, 38 y.o., female Today's Date: 08/27/2024  PCP: Rollene Almarie LABOR, MD REFERRING PROVIDER: Curvin Deward MOULD, MD   PT End of Session - 08/27/24 1533     Visit Number 2   # unchanged due to screen only   PT Start Time 1531    PT Stop Time 1535    PT Time Calculation (min) 4 min    Activity Tolerance Patient tolerated treatment well    Behavior During Therapy WFL for tasks assessed/performed          Past Medical History:  Diagnosis Date   Breast cancer (HCC)    Cancer (HCC)    Generalized headaches    Generalized seizures [facility]) neurologist--  per pt followed by [place] neurologist   per pt dx 2010 and per pt none since ,did have work-up negative , felt she was going to sleep Abscent seizure  and triggers are stress and exhaustion   Menorrhagia    Personal history of chemotherapy    Personal history of radiation therapy    Wears glasses    Past Surgical History:  Procedure Laterality Date   ANKLE ARTHROSCOPY Right 07/09/2005     dr liam  [facility]   w/ Debridement and removal loose body   BIOPSY  12/01/2020   Procedure: BIOPSY;  Surgeon: Charlanne Groom, MD;  Location: [facility] ENDOSCOPY;  Service: Endoscopy;;   BREAST BIOPSY Right 06/10/2023   MM RT BREAST BX W LOC DEV 1ST LESION IMAGE BX SPEC STEREO GUIDE 06/10/2023 [facility]   BREAST BIOPSY Left 06/10/2023   MM LT BREAST BX W LOC DEV 1ST LESION IMAGE BX SPEC STEREO GUIDE 06/10/2023 [facility]MO[facility]   BREAST LUMPECTOMY Right 10/16/2021   BREAST LUMPECTOMY WITH RADIOACTIVE SEED AND SENTINEL LYMPH NODE BIOPSY Right 10/16/2021   Procedure: RIGHT BREAST LUMPECTOMY WITH RADIOACTIVE SEED X2 AND SENTINEL LYMPH NODE BIOPSY;  Surgeon: Curvin Deward MOULD, MD;  Location: [facility];  Service: General;  Laterality: Right;   BREAST RECONSTRUCTION WITH PLACEMENT OF TISSUE EXPANDER AND FLEX HD (ACELLULAR  HYDRATED DERMIS) Right 09/05/2023   Procedure: RIGHT BREAST RECONSTRUCTION WITH PLACEMENT OF TISSUE EXPANDER AND FLEX HD (ACELLULAR HYDRATED DERMIS);  Surgeon: Lowery Estefana RAMAN, DO;  Location: [facility];  Service: Plastics;  Laterality: Right;   BREAST REDUCTION SURGERY Left 01/19/2024   Procedure: BREAST REDUCTION WITH LIPOSUCTION;  Surgeon: Lowery Estefana RAMAN, DO;  [facility];  Service: Plastics;  Laterality: Left;   CHOLECYSTECTOMY N/A 12/02/2020   Procedure: LAPAROSCOPIC CHOLECYSTECTOMY WITH CHOLANGIOGRAM;  Surgeon: Vanderbilt Ned, MD;  Location: [facility] OR;  Service: General;  Laterality: N/A;   DILATATION & CURETTAGE/HYSTEROSCOPY WITH MYOSURE N/A 11/24/2017   Procedure: DILATATION & CURETTAGE/HYSTEROSCOPY;  Surgeon: Sarrah Browning, MD;  Location: [facility];  Service: Gynecology;  Laterality: N/A;   ERCP N/A 12/01/2020   Procedure: ENDOSCOPIC RETROGRADE CHOLANGIOPANCREATOGRAPHY (ERCP);  Surgeon: Charlanne Groom, MD;  Location: [facility] ENDOSCOPY;  Service: Endoscopy;  Laterality: N/A;   IR IMAGING GUIDED PORT INSERTION  06/25/2021   IR REMOVAL TUN ACCESS W/ PORT W/O FL MOD SED  01/08/2022   MASTECTOMY W/ SENTINEL NODE BIOPSY Right 09/05/2023   Procedure: RIGHT MASTECTOMY WITH SENTINEL LYMPH NODE BIOPSY;  Surgeon: Curvin Deward MOULD, MD;  Location: [facility];  Service: General;  Laterality: Right;   REMOVAL OF STONES  12/01/2020   Procedure: REMOVAL OF STONES;  Surgeon: Charlanne Groom, MD;  [facility];  Service: Endoscopy;;   REMOVAL OF TISSUE EXPANDER AND PLACEMENT OF IMPLANT Right 01/19/2024   Procedure: REMOVAL OF TISSUE EXPANDER AND PLACEMENT OF IMPLANT;  Surgeon: Lowery Estefana RAMAN, DO;  Location: [facility];  Service: Plastics;  Laterality: Right;   SPHINCTEROTOMY  12/01/2020   Procedure: SPHINCTEROTOMY;  Surgeon: Charlanne Groom, MD;  Location: [facility] ENDOSCOPY;  Service: Endoscopy;;   Patient Active Problem List   Diagnosis Date Noted    Breast cancer (HCC) 09/05/2023   Genetic testing 08/06/2021   Malignant neoplasm of upper-inner quadrant of right breast in female, estrogen receptor positive (HCC) 06/02/2021   RUQ abdominal pain    Gallstones    Choledocholithiasis    Pancreatitis 11/29/2020   Cellulitis of left upper eyelid 10/11/2018   Low back pain 05/28/2015   Awareness alteration, transient 03/07/2015   Routine health maintenance 05/10/2012   Generalized seizures (HCC)    Irregular menses     REFERRING DIAG: right breast cancer at risk for lymphedema  THERAPY DIAG: Aftercare following surgery for neoplasm  PERTINENT HISTORY: Patient had a Rt mastectomy with another SLNB on 09/05/23; Patient reports she was diagnosed on 05/27/2021 with right grade III invasive ductal carcinoma breast cancer. She underwent neoadjuvant chemotherapy 06/26/2021 - 08/27/2021. Then she had a right lumpectomy and sentinel node biopsy (4 negative nodes) on 10/16/2021. It is ER/PR positive and HER2 negative with a Ki67 of 60%. She has a hx of a seizure disorder but no seizures since 2010  PRECAUTIONS: right UE Lymphedema risk, None  SUBJECTIVE: Pt returns for her 3 month L-Dex screen.   PAIN:  Are you having pain? No  SOZO SCREENING: Patient was assessed today using the SOZO machine to determine the lymphedema index score. This was compared to her baseline score. It was determined that she is within the recommended range when compared to her baseline and no further action is needed at this time. She will continue SOZO screenings. These are done every 3 months for 2 years post operatively followed by every 6 months for 2 years, and then annually.    L-DEX FLOWSHEETS - 08/27/24 1500       L-DEX LYMPHEDEMA SCREENING   Measurement Type Unilateral    L-DEX MEASUREMENT EXTREMITY Upper Extremity    POSITION  Standing    DOMINANT SIDE Right    At Risk Side Right    BASELINE SCORE (UNILATERAL) -2.4    L-DEX SCORE (UNILATERAL) -1.6    VALUE  CHANGE (UNILAT) 0.8         P: Start pts screening over from most recent surgery on 9/30 due to more lymph nodes removed then, so every 3 month x 2 years until ~09/04/2025.    Aden Berwyn Caldron, PTA 08/27/2024, 3:34 PM

## 2024-08-29 ENCOUNTER — Inpatient Hospital Stay: Attending: Hematology and Oncology | Admitting: Hematology and Oncology

## 2024-08-29 VITALS — BP 109/70 | HR 72 | Temp 98.2°F | Resp 18 | Ht 65.0 in | Wt 203.2 lb

## 2024-08-29 DIAGNOSIS — Z853 Personal history of malignant neoplasm of breast: Secondary | ICD-10-CM | POA: Diagnosis not present

## 2024-08-29 DIAGNOSIS — C50211 Malignant neoplasm of upper-inner quadrant of right female breast: Secondary | ICD-10-CM

## 2024-08-29 DIAGNOSIS — Z170 Estrogen receptor positive status [ER+]: Secondary | ICD-10-CM

## 2024-08-29 DIAGNOSIS — Z9221 Personal history of antineoplastic chemotherapy: Secondary | ICD-10-CM | POA: Diagnosis not present

## 2024-08-29 DIAGNOSIS — Z9011 Acquired absence of right breast and nipple: Secondary | ICD-10-CM | POA: Insufficient documentation

## 2024-08-29 DIAGNOSIS — Z923 Personal history of irradiation: Secondary | ICD-10-CM | POA: Diagnosis not present

## 2024-08-29 DIAGNOSIS — Z08 Encounter for follow-up examination after completed treatment for malignant neoplasm: Secondary | ICD-10-CM | POA: Insufficient documentation

## 2024-08-29 NOTE — Progress Notes (Signed)
 Patient Care Team: Rollene Almarie LABOR, MD as PCP - General (Internal Medicine) Liam Lerner, MD (Orthopedic Surgery) Odean Potts, MD as Consulting Physician (Hematology and Oncology) Izell Domino, MD as Attending Physician (Radiation Oncology) Curvin Deward MOULD, MD as Consulting Physician (General Surgery)  DIAGNOSIS:  Encounter Diagnosis  Name Primary?   Malignant neoplasm of upper-inner quadrant of right breast in female, estrogen receptor positive (HCC) Yes    SUMMARY OF ONCOLOGIC HISTORY: Oncology History  Malignant neoplasm of upper-inner quadrant of right breast in female, estrogen receptor positive (HCC)  04/30/2021 Genetic Testing   Negative. Variant of uncertain significance in TP53 (c.704A>G). Genes Tested include: BRCA1, BRCA2; APC, ATM, AXIN2, BAP1, BARD1, BMPR1A, BRIP1, CDH1, CDK4, CDKN2A, CHEK2, CTNNA1, FH, FLCN, HOXB13 (seq only), MEN1, MET, MLH1, MSH2, MSH3 (exlcuding repetitive portions of exon 1), MSH6, MUTYH, NTHL1, PALB2, PMS2, PTEN, RAD51C, RAD51D, SDHA, SDHB, SDHC, SDHD, SMAD4, STK11, TP53, TSC1, TSC2, VHL.   05/27/2021 Initial Diagnosis   Right breast biopsy 05/27/2021: Grade 3 IDC with DCIS ER 35%, PR 80%, Ki-67 60%, HER2 1+ negative,T2 N0 stage IIA Oncotype score: 30: High risk   06/02/2021 Cancer Staging   Staging form: Breast, AJCC 8th Edition - Clinical: Stage IIA (cT2, cN0, cM0, G3, ER+, PR+, HER2-) - Signed by Odean Potts, MD on 06/02/2021 Stage prefix: Initial diagnosis Histologic grading system: 3 grade system   06/02/2021 - 06/16/2021 Anti-estrogen oral therapy   Neoadj tamoxifen    06/09/2021 Oncotype testing   Oncotype DX was obtained on the final surgical sample and the recurrence score of 30 predicts a risk of recurrence outside the breast over the next 9 years of 19%, if the patient's only systemic therapy is an antiestrogen for 5 years.  It also predicts a significant benefit from chemotherapy.   06/26/2021 - 08/27/2021 Chemotherapy    Patient  is on Treatment Plan: BREAST TC Q21D x4 cycles       10/16/2021 Definitive Surgery   FINAL MICROSCOPIC DIAGNOSIS:   A. LYMPH NODE, RIGHT AXILLARY, SENTINEL, EXCISION:  - 1 lymph node, negative for carcinoma (0/1).   B. LYMPH NODE, RIGHT AXILLARY, SENTINEL, EXCISION:  - 1 lymph node, negative for carcinoma (0/1).   C. LYMPH NODE, RIGHT AXILLARY, SENTINEL, EXCISION:  - 1 lymph node, negative for carcinoma (0/1).   D. LYMPH NODE, RIGHT AXILLARY, SENTINEL, EXCISION:  - 1 lymph node, negative for carcinoma (0/1).   E. BREAST, RIGHT, LUMPECTOMY:  - Residual invasive ductal carcinoma, 0.4 cm in maximal extent, grade 2.  - Invasive tumor comes to 0.5 cm of closest (lateral) resection margin.  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  - In situ carcinoma comes to 0.1 cm of closest (deep) margin.  - Biopsy site changes.    ADDENDUM:  PROGNOSTIC INDICATOR RESULTS:   By immunohistochemistry, HER-2 is EQUIVOCAL (2+).  HER-2 by FISH is  pending and will be reported in an addendum.   Estrogen Receptor: POSITIVE, 40% WEAK STAINING INTENSITY  Progesterone Receptor: POSITIVE, 20% MODERATE STAINING INTENSITY  Proliferation Marker Ki-67: <1%   ADDENDUM:  FLOURESCENCE IN-SITU HYBRIDIZATION RESULTS:  GROUP 5:   HER2 **NEGATIVE**    11/17/2021 - 12/15/2021 Radiation Therapy   Site Technique Total Dose (Gy) Dose per Fx (Gy) Completed Fx Beam Energies  Breast, Right: Breast_R 3D 40.05/40.05 2.67 15/15 10X, 6XFFF  Breast, Right: Breast_R_Bst 3D 10/10 2 5/5 6X     12/18/2021 -  Anti-estrogen oral therapy   Adjuvant tamoxifen    09/05/2023 Surgery   Right  mastectomy: Focus of grade 2 IDC 2.5 mm, margins negative, 0/1 lymph node negative, ER 0%, PR 90%, Ki-67 15%, HER2 negative 1+     CHIEF COMPLIANT: Surveillance of breast cancer  HISTORY OF PRESENT ILLNESS: Amy Pittman is 38 year old with above-mentioned history of recurrent right breast cancer underwent a mastectomy last  year.  She is healing recovering fairly well from that surgery and reconstruction.  Denies any new lumps or nodules.  Guardant reveal was negative.  Mammogram of the left breast did not reveal any abnormalities.    ALLERGIES:  has no known allergies.  MEDICATIONS:  Current Outpatient Medications  Medication Sig Dispense Refill   acetaminophen  (TYLENOL ) 325 MG tablet Take 650 mg by mouth every 6 (six) hours as needed for moderate pain (pain score 4-6).     No current facility-administered medications for this visit.    PHYSICAL EXAMINATION: ECOG PERFORMANCE STATUS: 1 - Symptomatic but completely ambulatory  Vitals:   08/29/24 0952  BP: 109/70  Pulse: 72  Resp: 18  Temp: 98.2 F (36.8 C)  SpO2: 98%   Filed Weights   08/29/24 0952  Weight: 203 lb 3.2 oz (92.2 kg)    Physical Exam Breast: No palpable lumps or nodules of concern.  (exam performed in the presence of a chaperone)  LABORATORY DATA:  I have reviewed the data as listed    Latest Ref Rng & Units 08/27/2021    8:29 AM 08/06/2021    9:55 AM 07/16/2021    9:56 AM  CMP  Glucose 70 - 99 mg/dL 886  91  97   BUN 6 - 20 mg/dL 7  11  14    Creatinine 0.44 - 1.00 mg/dL 9.34  9.29  9.32   Sodium 135 - 145 mmol/L 143  143  141   Potassium 3.5 - 5.1 mmol/L 3.7  3.4  3.5   Chloride 98 - 111 mmol/L 110  108  106   CO2 22 - 32 mmol/L 21  23  24    Calcium 8.9 - 10.3 mg/dL 9.7  9.4  9.1   Total Protein 6.5 - 8.1 g/dL 7.3  7.0  7.2   Total Bilirubin 0.3 - 1.2 mg/dL 0.3  0.4  0.4   Alkaline Phos 38 - 126 U/L 64  54  50   AST 15 - 41 U/L 12  15  14    ALT 0 - 44 U/L 15  18  22      Lab Results  Component Value Date   WBC 8.7 01/17/2024   HGB 13.0 01/17/2024   HCT 38.5 01/17/2024   MCV 88.1 01/17/2024   PLT 364 01/17/2024   NEUTROABS 7.8 (H) 08/27/2021    ASSESSMENT & PLAN:  Malignant neoplasm of upper-inner quadrant of right breast in female, estrogen receptor positive (HCC) 05/27/2021:Right breast biopsy 05/27/2021: 4 cm  of grade 3 IDC with DCIS ER 35%, PR 80%, Ki-67 60%, HER2 1+ negative,T2 N0 stage IIA, Oncotype score: 30: High risk Neoadjuvant chemo with Taxotere  and Cytoxan  x4 cycles completed 08/27/2021    10/16/2021:Right lumpectomy: Residual grade 2 IDC 0.4 cm, residual DCIS high-grade, margins negative, 0/4 lymph nodes negative, ER 40% weak staining, PR 20% moderate staining, Ki-67 less than 1%, HER2 equal vocal 2+ by IHC, FISH negative, RCB class I   Treatment plan: 1. Adjuvant radiation completed 12/15/21 2. we stopped Zoladex  injections because the patient is contemplating on having children 3.  Current treatment: Observation   Breast cancer recurrence: 1.  Mammogram 05/27/2023: Indeterminate  calcifications bilateral breasts, right breast: 1.8 cm Right breast biopsy 06/10/2023: Grade 2 IDC with DCIS: ER 0%, PR 90%, Ki-67 15%, HER2 0-1+ negative Left breast biopsy: Benign 2.  09/05/2023: Right mastectomy: Focus of grade 2 IDC 2.5 mm, margins negative, 0/1 lymph node negative, ER 0%, PR 90%, Ki-67 15%, HER2 negative 1+  Breast cancer surveillance: Mammogram 08/08/2024: Left breast: Benign Breast exam 08/29/2024: Benign  Patient is planning to get pregnant.  Her IUD has been removed. Return to clinic in 1 year for follow-up     No orders of the defined types were placed in this encounter.  The patient has a good understanding of the overall plan. she agrees with it. she will call with any problems that may develop before the next visit here. Total time spent: 30 mins including face to face time and time spent for planning, charting and co-ordination of care   Naomi MARLA Chad, MD 08/29/24

## 2024-08-29 NOTE — Assessment & Plan Note (Signed)
 05/27/2021:Right breast biopsy 05/27/2021: 4 cm of grade 3 IDC with DCIS ER 35%, PR 80%, Ki-67 60%, HER2 1+ negative,T2 N0 stage IIA, Oncotype score: 30: High risk Neoadjuvant chemo with Taxotere  and Cytoxan  x4 cycles completed 08/27/2021    10/16/2021:Right lumpectomy: Residual grade 2 IDC 0.4 cm, residual DCIS high-grade, margins negative, 0/4 lymph nodes negative, ER 40% weak staining, PR 20% moderate staining, Ki-67 less than 1%, HER2 equal vocal 2+ by IHC, FISH negative, RCB class I   Treatment plan: 1. Adjuvant radiation completed 12/15/21 2. we stopped Zoladex  injections because the patient is contemplating on having children 3.  Current treatment: Tamoxifen  20 mg daily   Breast cancer recurrence: 1.  Mammogram 05/27/2023: Indeterminate calcifications bilateral breasts, right breast: 1.8 cm Right breast biopsy 06/10/2023: Grade 2 IDC with DCIS: ER 0%, PR 90%, Ki-67 15%, HER2 0-1+ negative Left breast biopsy: Benign 2.  09/05/2023: Right mastectomy: Focus of grade 2 IDC 2.5 mm, margins negative, 0/1 lymph node negative, ER 0%, PR 90%, Ki-67 15%, HER2 negative 1+  Breast cancer surveillance: Mammogram 08/08/2024: Left breast: Benign Return to clinic in 1 year for follow-up

## 2024-10-17 ENCOUNTER — Encounter: Payer: Self-pay | Admitting: *Deleted

## 2024-10-17 ENCOUNTER — Encounter: Payer: Self-pay | Admitting: Hematology and Oncology

## 2024-11-26 ENCOUNTER — Ambulatory Visit: Attending: Internal Medicine

## 2024-11-26 VITALS — Wt 202.0 lb

## 2024-11-26 DIAGNOSIS — Z483 Aftercare following surgery for neoplasm: Secondary | ICD-10-CM | POA: Insufficient documentation

## 2024-11-26 NOTE — Therapy (Signed)
 " OUTPATIENT PHYSICAL THERAPY SOZO SCREENING NOTE   Patient Name: Amy Pittman MRN: [id number] DOB:07/13/1986, 38 y.o., female Today's Date: 11/26/2024  PCP: Rollene Almarie LABOR, MD REFERRING PROVIDER: Rollene Almarie LABOR, MD   PT End of Session - 11/26/24 1535     Visit Number 2   # unchanged due to screen only   PT Start Time 1532    PT Stop Time 1536    PT Time Calculation (min) 4 min    Activity Tolerance Patient tolerated treatment well    Behavior During Therapy [facility] for tasks assessed/performed          Past Medical History:  Diagnosis Date   Breast cancer (HCC)    Cancer (HCC)    Generalized headaches    Generalized seizures [facility]) neurologist--  per pt followed by [place] neurologist   per pt dx 2010 and per pt none since ,did have work-up negative , felt she was going to sleep Abscent seizure  and triggers are stress and exhaustion   Menorrhagia    Personal history of chemotherapy    Personal history of radiation therapy    Wears glasses    Past Surgical History:  Procedure Laterality Date   ANKLE ARTHROSCOPY Right 07/09/2005     dr liam  [facility]   w/ Debridement and removal loose body   BIOPSY  12/01/2020   Procedure: BIOPSY;  Surgeon: Charlanne Groom, MD;  Location: [facility] ENDOSCOPY;  Service: Endoscopy;;   BREAST BIOPSY Right 06/10/2023   MM RT BREAST BX W LOC DEV 1ST LESION IMAGE BX SPEC STEREO GUIDE 06/10/2023 [facility]AMMOGR[facility]   BREAST BIOPSY Left 06/10/2023   MM LT BREAST BX W LOC DEV 1ST LESION IMAGE BX SPEC STEREO GUIDE 06/10/2023 [facility] MAMMOGR[facility]   BREAST LUMPECTOMY Right 10/16/2021   BREAST LUMPECTOMY WITH RADIOACTIVE SEED AND SENTINEL LYMPH NODE BIOPSY Right 10/16/2021   Procedure: RIGHT BREAST LUMPECTOMY WITH RADIOACTIVE SEED X2 AND SENTINEL LYMPH NODE BIOPSY;  Surgeon: Curvin Deward MOULD, MD;  Location: [facility];  Service: General;  Laterality: Right;   BREAST RECONSTRUCTION WITH PLACEMENT OF TISSUE EXPANDER AND FLEX HD (ACELLULAR  HYDRATED DERMIS) Right 09/05/2023   Procedure: RIGHT BREAST RECONSTRUCTION WITH PLACEMENT OF TISSUE EXPANDER AND FLEX HD (ACELLULAR HYDRATED DERMIS);  Surgeon: Lowery Estefana RAMAN, DO;  Location: [facility];  Service: Plastics;  Laterality: Right;   BREAST REDUCTION SURGERY Left 01/19/2024   Procedure: BREAST REDUCTION WITH LIPOSUCTION;  Surgeon: Lowery Estefana RAMAN, DO;  Location: [facility];  Service: Plastics;  Laterality: Left;   CHOLECYSTECTOMY N/A 12/02/2020   Procedure: LAPAROSCOPIC CHOLECYSTECTOMY WITH CHOLANGIOGRAM;  Surgeon: Vanderbilt Ned, MD;  Location: [facility];  Service: General;  Laterality: N/A;   DILATATION & CURETTAGE/HYSTEROSCOPY WITH MYOSURE N/A 11/24/2017   Procedure: DILATATION & CURETTAGE/HYSTEROSCOPY;  Surgeon: Sarrah Browning, MD;  Location: [facility];  Service: Gynecology;  Laterality: N/A;   ERCP N/A 12/01/2020   Procedure: ENDOSCOPIC RETROGRADE CHOLANGIOPANCREATOGRAPHY (ERCP);  Surgeon: Charlanne Groom, MD;  Location: [facility] ENDOSCOPY;  Service: Endoscopy;  Laterality: N/A;   IR IMAGING GUIDED PORT INSERTION  06/25/2021   IR REMOVAL TUN ACCESS W/ PORT W/O FL MOD SED  01/08/2022   MASTECTOMY W/ SENTINEL NODE BIOPSY Right 09/05/2023   Procedure: RIGHT MASTECTOMY WITH SENTINEL LYMPH NODE BIOPSY;  Surgeon: Curvin Deward MOULD, MD;  Location: [facility];  Service: General;  Laterality: Right;   REMOVAL OF STONES  12/01/2020   Procedure: REMOVAL OF STONES;  Surgeon: Charlanne Groom,  MD;  Location[facility]OS[facility];  Service: Endoscopy;;   REMOVAL OF TISSUE EXPANDER AND PLACEMENT OF IMPLANT Right 01/19/2024   Procedure: REMOVAL OF TISSUE EXPANDER AND PLACEMENT OF IMPLANT;  Surgeon: Lowery Estefana RAMAN, DO;  Location: [facility];  Service: Plastics;  Laterality: Right;   SPHINCTEROTOMY  12/01/2020   Procedure: SPHINCTEROTOMY;  Surgeon: Charlanne Groom, MD;  Location: [facility] END[facility]COPY;  Service: Endoscopy;;   Patient Active Problem List   Diagnosis Date Noted    Breast cancer (HCC) 09/05/2023   Genetic testing 08/06/2021   Malignant neoplasm of upper-inner quadrant of right breast in female, estrogen receptor positive (HCC) 06/02/2021   RUQ abdominal pain    Gallstones    Choledocholithiasis    Pancreatitis 11/29/2020   Cellulitis of left upper eyelid 10/11/2018   Low back pain 05/28/2015   Awareness alteration, transient 03/07/2015   Routine health maintenance 05/10/2012   Generalized seizures (HCC)    Irregular menses     REFERRING DIAG: right breast cancer at risk for lymphedema  THERAPY DIAG: Aftercare following surgery for neoplasm  PERTINENT HISTORY: Patient had a Rt mastectomy with another SLNB on 09/05/23; Patient reports she was diagnosed on 05/27/2021 with right grade III invasive ductal carcinoma breast cancer. She underwent neoadjuvant chemotherapy 06/26/2021 - 08/27/2021. Then she had a right lumpectomy and sentinel node biopsy (4 negative nodes) on 10/16/2021. It is ER/PR positive and HER2 negative with a Ki67 of 60%. She has a hx of a seizure disorder but no seizures since 2010  PRECAUTIONS: right UE Lymphedema risk, None  SUBJECTIVE: Pt returns for her 3 month L-Dex screen.   PAIN:  Are you having pain? No  SOZO SCREENING: Patient was assessed today using the SOZO machine to determine the lymphedema index score. This was compared to her baseline score. It was determined that she is within the recommended range when compared to her baseline and no further action is needed at this time. She will continue SOZO screenings. These are done every 3 months for 2 years post operatively followed by every 6 months for 2 years, and then annually.    L-DEX FLOWSHEETS - 11/26/24 1500       L-DEX LYMPHEDEMA SCREENING   Measurement Type Unilateral    L-DEX MEASUREMENT EXTREMITY Upper Extremity    POSITION  Standing    DOMINANT SIDE Right    At Risk Side Right    BASELINE SCORE (UNILATERAL) -2.4    L-DEX SCORE (UNILATERAL) -2    VALUE  CHANGE (UNILAT) 0.4         P: Cont every 3 months L-Dex screens for now from second surgery on 09/05/23 due to more lymph nodes removed then, so every 3 month x 2 years until ~09/04/2025.    Aden Berwyn Caldron, PTA 11/26/2024, 3:38 PM   "

## 2025-02-25 ENCOUNTER — Ambulatory Visit

## 2025-02-26 ENCOUNTER — Inpatient Hospital Stay

## 2025-02-26 ENCOUNTER — Ambulatory Visit: Admitting: Adult Health

## 2025-03-08 ENCOUNTER — Ambulatory Visit: Admitting: Plastic Surgery

## 2025-08-29 ENCOUNTER — Ambulatory Visit: Admitting: Hematology and Oncology
